# Patient Record
Sex: Female | Born: 1985 | Race: White | Hispanic: No | Marital: Married | Smoking: Never smoker
Health system: Southern US, Community
[De-identification: ages and names within clinical notes are randomized; demographics above are authoritative.]

## PROBLEM LIST (undated history)

## (undated) DIAGNOSIS — Z8619 Personal history of other infectious and parasitic diseases: Secondary | ICD-10-CM

## (undated) DIAGNOSIS — F988 Other specified behavioral and emotional disorders with onset usually occurring in childhood and adolescence: Secondary | ICD-10-CM

## (undated) DIAGNOSIS — I10 Essential (primary) hypertension: Secondary | ICD-10-CM

## (undated) HISTORY — DX: Other specified behavioral and emotional disorders with onset usually occurring in childhood and adolescence: F98.8

## (undated) HISTORY — DX: Personal history of other infectious and parasitic diseases: Z86.19

---
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
## 2005-08-01 ENCOUNTER — Other Ambulatory Visit: Admission: RE | Admit: 2005-08-01 | Discharge: 2005-08-01 | Payer: Self-pay | Admitting: Obstetrics and Gynecology

## 2005-09-05 ENCOUNTER — Other Ambulatory Visit: Admission: RE | Admit: 2005-09-05 | Discharge: 2005-09-05 | Payer: Self-pay | Admitting: Obstetrics and Gynecology

## 2008-11-06 ENCOUNTER — Other Ambulatory Visit: Admission: RE | Admit: 2008-11-06 | Discharge: 2008-11-06 | Payer: Self-pay | Admitting: Obstetrics and Gynecology

## 2008-12-03 ENCOUNTER — Other Ambulatory Visit: Admission: RE | Admit: 2008-12-03 | Discharge: 2008-12-03 | Payer: Self-pay | Admitting: Obstetrics and Gynecology

## 2012-01-02 ENCOUNTER — Other Ambulatory Visit: Payer: Self-pay | Admitting: Neurosurgery

## 2012-01-02 DIAGNOSIS — IMO0002 Reserved for concepts with insufficient information to code with codable children: Secondary | ICD-10-CM

## 2012-01-02 DIAGNOSIS — M545 Low back pain: Secondary | ICD-10-CM

## 2012-01-04 ENCOUNTER — Ambulatory Visit
Admission: RE | Admit: 2012-01-04 | Discharge: 2012-01-04 | Disposition: A | Discharge status: Home / Self Care | Payer: BC Managed Care – PPO | Source: Ambulatory Visit | Attending: Neurosurgery | Admitting: Neurosurgery

## 2012-01-04 DIAGNOSIS — IMO0002 Reserved for concepts with insufficient information to code with codable children: Secondary | ICD-10-CM

## 2012-01-04 DIAGNOSIS — M545 Low back pain: Secondary | ICD-10-CM

## 2012-07-18 ENCOUNTER — Other Ambulatory Visit: Payer: Self-pay | Admitting: Occupational Medicine

## 2012-07-18 ENCOUNTER — Ambulatory Visit
Admission: RE | Admit: 2012-07-18 | Discharge: 2012-07-18 | Disposition: A | Discharge status: Home / Self Care | Payer: No Typology Code available for payment source | Source: Ambulatory Visit | Attending: Occupational Medicine | Admitting: Occupational Medicine

## 2012-07-18 DIAGNOSIS — Z021 Encounter for pre-employment examination: Secondary | ICD-10-CM

## 2013-02-19 ENCOUNTER — Telehealth: Payer: Self-pay | Admitting: Nurse Practitioner

## 2013-02-19 MED ORDER — NORGESTIM-ETH ESTRAD TRIPHASIC 0.18/0.215/0.25 MG-35 MCG PO TABS: 1.0000 | ORAL_TABLET | Freq: Every day | ORAL | Status: DC

## 2013-02-19 NOTE — Telephone Encounter (Signed)
Reached pt's VM confirming name and LM that refills were called in to [facility] pharmacy.  aa

## 2013-02-19 NOTE — Telephone Encounter (Signed)
Pt would like a refill on trisprintec sent to [facility] pharmacy at [phone].

## 2013-02-19 NOTE — Telephone Encounter (Signed)
Ok to refill until AEX. 

## 2013-02-19 NOTE — Telephone Encounter (Signed)
[facility] pharmacy not available as a selection for e-prescribe. Call to [facility] for refill of Trisprintec 1 PO QD as directed. Disp 1 pack with 1 refill per PG.  aa

## 2013-02-19 NOTE — Telephone Encounter (Signed)
Pt has AEX appt 04-02-13. OK to refill trisprintec until then?  aa

## 2013-02-21 ENCOUNTER — Other Ambulatory Visit: Payer: Self-pay | Admitting: *Deleted

## 2013-02-21 MED ORDER — NORGESTIM-ETH ESTRAD TRIPHASIC 0.18/0.215/0.25 MG-35 MCG PO TABS: 1.0000 | ORAL_TABLET | Freq: Every day | ORAL | Status: DC

## 2013-04-01 ENCOUNTER — Encounter: Payer: Self-pay | Admitting: *Deleted

## 2013-04-02 ENCOUNTER — Encounter: Payer: Self-pay | Admitting: Nurse Practitioner

## 2013-04-02 ENCOUNTER — Ambulatory Visit: Payer: Self-pay | Admitting: Nurse Practitioner

## 2013-04-02 DIAGNOSIS — Z01419 Encounter for gynecological examination (general) (routine) without abnormal findings: Secondary | ICD-10-CM

## 2013-04-29 ENCOUNTER — Ambulatory Visit (INDEPENDENT_AMBULATORY_CARE_PROVIDER_SITE_OTHER): Payer: 59 | Admitting: Nurse Practitioner

## 2013-04-29 ENCOUNTER — Encounter: Payer: Self-pay | Admitting: Nurse Practitioner

## 2013-04-29 VITALS — BP 104/60 | HR 56 | Resp 12 | Ht 63.0 in | Wt 125.4 lb

## 2013-04-29 DIAGNOSIS — Z0000 Encounter for general adult medical examination without abnormal findings: Secondary | ICD-10-CM

## 2013-04-29 DIAGNOSIS — Z309 Encounter for contraceptive management, unspecified: Secondary | ICD-10-CM

## 2013-04-29 DIAGNOSIS — IMO0001 Reserved for inherently not codable concepts without codable children: Secondary | ICD-10-CM

## 2013-04-29 DIAGNOSIS — Z01419 Encounter for gynecological examination (general) (routine) without abnormal findings: Secondary | ICD-10-CM

## 2013-04-29 LAB — POCT URINALYSIS DIPSTICK
Leukocytes, UA: NEGATIVE
Spec Grav, UA: 1.02
Urobilinogen, UA: NEGATIVE

## 2013-04-29 MED ORDER — NORGESTIM-ETH ESTRAD TRIPHASIC 0.18/0.215/0.25 MG-35 MCG PO TABS: 1.0000 | ORAL_TABLET | Freq: Every day | ORAL | Status: DC

## 2013-04-29 NOTE — Patient Instructions (Signed)

## 2013-04-29 NOTE — Progress Notes (Signed)
27 y.o. G0P0 Married Caucasian Fe here for annual exam. No new health problems. Graduated from [employer] 04/12/13. Her menses last 4-5 days on OCP.  Not planning family this year but plans on genetic testing prior because of brothers history of Batten disease.  Patient's last menstrual period was 04/17/2013.          Sexually active: yes  The current method of family planning is OCP (estrogen/progesterone).    Exercising: yes  cardio and weight lifting.  Smoker:  no  Health Maintenance: Pap:  12/20/2011  HPV detected ( For repeat co testing this year) TDaP:  08/2012 Labs: pt refused   reports that she has never smoked. She has never used smokeless tobacco. She reports that  drinks alcohol. She reports that she does not use illicit drugs.  Past Medical History  Diagnosis Date  . STD (sexually transmitted disease)     HSV  . ADD (attention deficit disorder)     History reviewed. No pertinent past surgical history.  Current Outpatient Prescriptions  Medication Sig Dispense Refill  . Multiple Vitamins-Minerals (MULTIVITAMIN PO) Take by mouth daily.      . Norgestimate-Ethinyl Estradiol Triphasic 0.18/0.215/0.25 MG-35 MCG tablet Take 1 tablet by mouth daily.  1 Package  1   No current facility-administered medications for this visit.    History reviewed. No pertinent family history.  ROS:  Pertinent items are noted in HPI.  Otherwise, a comprehensive ROS was negative.  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 Heart: regular rate and rhythm Abdomen: soft, non-tender; no masses,  no organomegaly Extremities: extremities normal, atraumatic, no cyanosis or edema Skin: Skin color, texture, turgor normal. No rashes or lesions Lymph nodes: Cervical, supraclavicular, and axillary nodes normal. No abnormal inguinal nodes palpated Neurologic: Grossly normal   Pelvic: External genitalia:  no lesions              Urethra:  normal appearing urethra with no masses, tenderness or lesions              Bartholin's and Skene's: normal                 Vagina: normal appearing vagina with normal color and discharge, no lesions              Cervix: anteverted              Pap taken: yes           (Always hard to visualize cervix secondary to patient cooperation) Bimanual Exam:  Uterus:  normal size, contour, position, consistency, mobility, non-tender              Adnexa: no mass, fullness, tenderness               Rectovaginal: Confirms               Anus:  normal sphincter  tone, no lesions  A:  Well Woman with normal exam  Contraception   P:   Pap smear as per guidelines   Refill om Tri Sprintec for 1 year  Will use OTC antifungal med's prn.  counseled on adequate intake of calcium and vitamin D, diet and exercise return annually or prn  An After Visit Summary was printed and given to the patient.

## 2013-04-30 NOTE — Progress Notes (Signed)
Reviewed personally.  M. Suzanne Jamilyn Pigeon, MD.  

## 2013-05-01 LAB — IPS PAP TEST WITH HPV

## 2013-05-02 ENCOUNTER — Other Ambulatory Visit: Payer: Self-pay | Admitting: Nurse Practitioner

## 2013-05-03 ENCOUNTER — Telehealth: Payer: Self-pay | Admitting: *Deleted

## 2013-05-03 ENCOUNTER — Other Ambulatory Visit: Payer: Self-pay | Admitting: Obstetrics & Gynecology

## 2013-05-03 NOTE — Telephone Encounter (Signed)
Patient notified of Pap showing abnormal cells and recommend colpo.  Brief explanation of procedure.  She is on OCP.  She will come early to sign consent and get Ativan. Appt scheduled for 05-08-13. Instructed to take Motrin 800mg  1 hr prior with food.

## 2013-05-03 NOTE — Telephone Encounter (Signed)
Message copied by Alisa Graff on Fri May 03, 2013 10:17 AM ------      Message from: Ria Comment R      Created: Thu May 02, 2013 10:48 AM       patient needs colpo biopsy - order has been placed ------

## 2013-05-08 ENCOUNTER — Encounter: Payer: Self-pay | Admitting: Obstetrics & Gynecology

## 2013-05-08 ENCOUNTER — Ambulatory Visit (INDEPENDENT_AMBULATORY_CARE_PROVIDER_SITE_OTHER): Payer: 59 | Admitting: Obstetrics & Gynecology

## 2013-05-08 VITALS — BP 124/80 | HR 60 | Temp 97.9°F | Resp 14 | Ht 63.75 in | Wt 128.0 lb

## 2013-05-08 DIAGNOSIS — R6889 Other general symptoms and signs: Secondary | ICD-10-CM

## 2013-05-08 DIAGNOSIS — R87610 Atypical squamous cells of undetermined significance on cytologic smear of cervix (ASC-US): Secondary | ICD-10-CM

## 2013-05-08 DIAGNOSIS — R87810 Cervical high risk human papillomavirus (HPV) DNA test positive: Secondary | ICD-10-CM

## 2013-05-08 NOTE — Patient Instructions (Signed)

## 2013-05-08 NOTE — Progress Notes (Addendum)
Subjective:     Patient ID: Carly Suarez, female   DOB: 1985-12-06, 27 y.o.   MRN: [id number]  HPI 27 year old G0 with newly abnormal Pap smear.  ASCUS Pap with +HR HPV obtained at [facility]EX by Ria Comment 04/29/13.  Results and HPV findings d/w pt and spouse.  Procedure described.  All questions answered.  LMP 04/17/13.  On OCPs for contraception.   Review of Systems  All other systems reviewed and are negative.   Ativan 1mg  po x 1 given before procedure and before consent was obtained.  Lot # [id number], exp 12/14.    Objective:   Physical Exam  Genitourinary:         Blood pressure 124/80, pulse 60, temperature 97.9 F (36.6 C), resp. rate 14, height 5' 3.75" (1.619 m), weight 128 lb (58.06 kg), last menstrual period 04/17/2013.  Speculum inserted atraumatically and cervix visualized.  3% acetic acid applied.  Cervix examined using 3.75 and 7.5   X magnification and green filter.  HPV changes noted at 4-6 o'clock position on cervix.  Ectocervical trasntion zone noted.  Mosaicism with AWE at 11- 1 o'clock.  Biopsy obtained at 11:00 and ECC obtained.  Monsel's used for adquate hemostasis.  Colposcopy was adequate.  Patient tolerated procedure well.  All instruments removed.       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

## 2013-05-13 NOTE — Addendum Note (Signed)
Addended by: Jerene Bears on: 05/13/2013 08:34 AM   Modules accepted: Orders

## 2013-05-16 ENCOUNTER — Telehealth: Payer: Self-pay

## 2013-05-16 NOTE — Telephone Encounter (Signed)
6/25 lmtcb//kn 

## 2013-05-16 NOTE — Telephone Encounter (Signed)
Message copied by Elisha Headland on Thu May 16, 2013  9:33 AM ------      Message from: Jerene Bears      Created: Wed May 15, 2013  1:27 PM       Inform biopsy showed CIN 1, low grade changes.  Needs pap and HR HPV one year.  Would prefer her AEX be with me so I can do her Pap smear.  Recall one year. ------

## 2013-05-16 NOTE — Telephone Encounter (Signed)
Patient notified of all results. Appointment made for next year, recall in//kn

## 2013-11-18 ENCOUNTER — Telehealth: Payer: Self-pay | Admitting: Obstetrics & Gynecology

## 2013-11-18 NOTE — Telephone Encounter (Signed)
Message left to return call to Christna Kulick at [phone].    

## 2013-11-18 NOTE — Telephone Encounter (Signed)
Pt has some knots around her labia

## 2013-11-19 ENCOUNTER — Ambulatory Visit (INDEPENDENT_AMBULATORY_CARE_PROVIDER_SITE_OTHER): Payer: 59 | Admitting: Nurse Practitioner

## 2013-11-19 ENCOUNTER — Encounter: Payer: Self-pay | Admitting: Nurse Practitioner

## 2013-11-19 VITALS — BP 120/84 | HR 84 | Ht 63.0 in | Wt 134.0 lb

## 2013-11-19 DIAGNOSIS — N907 Vulvar cyst: Secondary | ICD-10-CM

## 2013-11-19 DIAGNOSIS — N9089 Other specified noninflammatory disorders of vulva and perineum: Secondary | ICD-10-CM

## 2013-11-19 MED ORDER — SULFAMETHOXAZOLE-TMP DS 800-160 MG PO TABS
1.0000 | ORAL_TABLET | Freq: Two times a day (BID) | ORAL | Status: DC
Start: 2013-11-19 — End: 2013-12-10

## 2013-11-19 NOTE — Telephone Encounter (Signed)
Return call to patient, reports "knots" on labia minora that are uncomfortable with wiping and intercourse. Requests to have area checked.  Appt today with Patty at 1:45pm.   Routing to provider for final review. Patient agreeable to disposition. Will close encounter

## 2013-11-19 NOTE — Progress Notes (Signed)
Subjective:     Patient ID: Carly Suarez, female   DOB: December 30, 1985, 27 y.o.   MRN: [id number]  HPI  This 27 yo Fe complains of pain of the right labia for over a week.  She has history of labia HSV I but his is not similar to past lesions.  Area is uncomfortable with SA and with wiping.  She is married and in a monogamous relationship.  Denies any urinary symptoms.   Review of Systems  Constitutional: Negative for fever and chills.  Gastrointestinal: Negative for nausea, vomiting and abdominal pain.  Genitourinary: Positive for vaginal pain and dyspareunia. Negative for dysuria, urgency, frequency, hematuria, flank pain, vaginal discharge and pelvic pain.       Objective:   Physical Exam  Constitutional: She appears well-developed and well-nourished. No distress.  Abdominal: Soft. She exhibits no distension. There is no tenderness. There is no rebound.  Genitourinary: Vagina normal and uterus normal.    No vaginal discharge found.  Two small 1-2 mm lesion without exudate or induration that is very tender to palpation right labia minora. Another lesion is noted on the left labia minora.   Patient also seen by Dr. Hyacinth Meeker for consultation.       Assessment:     Sebaceous cyst ?    Plan:     Will have her to start on antibiotics - Bactrim DS BID for 10 days.  Will also use Aveeno baths prn for discomfort Extra lubrication for SA Recheck here in 2 1/2 - 3 weeks on the same day that Dr. Hyacinth Meeker is here for a recheck.   If symptoms worsens in the interim to call back.

## 2013-11-19 NOTE — Telephone Encounter (Signed)
Returning a cal to [place].

## 2013-11-19 NOTE — Patient Instructions (Signed)

## 2013-11-19 NOTE — Progress Notes (Signed)
Reviewed personally.  M. Suzanne Josefina Rynders, MD.  

## 2013-12-10 ENCOUNTER — Encounter: Payer: Self-pay | Admitting: Nurse Practitioner

## 2013-12-10 ENCOUNTER — Ambulatory Visit (INDEPENDENT_AMBULATORY_CARE_PROVIDER_SITE_OTHER): Payer: 59 | Admitting: Nurse Practitioner

## 2013-12-10 VITALS — BP 120/84 | HR 64 | Ht 63.0 in | Wt 133.0 lb

## 2013-12-10 DIAGNOSIS — N907 Vulvar cyst: Secondary | ICD-10-CM

## 2013-12-10 DIAGNOSIS — N9089 Other specified noninflammatory disorders of vulva and perineum: Secondary | ICD-10-CM

## 2013-12-10 MED ORDER — SULFAMETHOXAZOLE-TMP DS 800-160 MG PO TABS: 1.0000 | ORAL_TABLET | Freq: Two times a day (BID) | ORAL | Status: DC

## 2013-12-10 NOTE — Progress Notes (Signed)
Subjective:     Patient ID: Carly Suarez, female   DOB: 09/20/1986, 28 y.o.   MRN: [id number]  HPI  This 28 yo WM Fe presents for a 2 week recheck on a vulvar lesions.  She has now completed antibiotic therapy with Bactrim.  After starting the antibiotic, area on the right got larger then started to improve, states it looked like there was a head but no exudate. The other 2 lesions are gone.   She is still tender at this site.  She can wear jeans and not as uncomfortable. Denies any urinary symptoms.  SA a few times since last here with slight discomfort even though used lubrication.  Review of Systems  Constitutional: Negative for fever, chills and fatigue.  HENT: Negative.   Cardiovascular: Negative.   Gastrointestinal: Negative.  Negative for nausea, vomiting, abdominal pain and diarrhea.  Genitourinary: Positive for vaginal pain and dyspareunia. Negative for dysuria, urgency, frequency, flank pain, vaginal bleeding, vaginal discharge, enuresis and pelvic pain.       Right vulva at same sight. Slight pain.  Musculoskeletal: Negative.   Skin: Negative.   Neurological: Negative.   Psychiatric/Behavioral: Negative.        Objective:   Physical Exam  Constitutional: She appears well-developed and well-nourished. No distress.  Patient was seen in consult with Dr. Hyacinth MeekerMiller as well.  Abdominal: Soft. She exhibits no distension. There is no tenderness.  Genitourinary:  Still remains a lesion on right labia that is maybe slight smaller that 2 weeks ago,.       Assessment:     Still feel this lesion could be a sebaceous cyst that is trying to resolve.    Plan:     Per Dr. Hyacinth MeekerMiller will do another course of antibiotics of Bactrim and recheck in 2 weeks.  If then still not better will get a biopsy of this area.  Prior to coming in for the biopsy she will need RX for Valium 5 mg X 1 and Lidocane jelly 5 % to apply topically.  Someone will need to drive her and she will need consent signed for  vulvar biopsy at next visit - not on day of visit secondary to med's.

## 2013-12-10 NOTE — Progress Notes (Signed)
Reviewed personally.  Pt see with Ria CommentPatricia Grubb as well.   Lum KeasM. Suzanne Elgie Maziarz, MD.

## 2013-12-18 ENCOUNTER — Encounter: Payer: Self-pay | Admitting: Obstetrics & Gynecology

## 2013-12-24 ENCOUNTER — Encounter: Payer: Self-pay | Admitting: Nurse Practitioner

## 2013-12-24 ENCOUNTER — Ambulatory Visit: Payer: 59 | Admitting: Nurse Practitioner

## 2013-12-24 ENCOUNTER — Ambulatory Visit (INDEPENDENT_AMBULATORY_CARE_PROVIDER_SITE_OTHER): Payer: 59 | Admitting: Nurse Practitioner

## 2013-12-24 VITALS — BP 100/64 | HR 76 | Ht 63.0 in | Wt 133.0 lb

## 2013-12-24 DIAGNOSIS — N907 Vulvar cyst: Secondary | ICD-10-CM

## 2013-12-24 DIAGNOSIS — N9089 Other specified noninflammatory disorders of vulva and perineum: Secondary | ICD-10-CM

## 2013-12-24 NOTE — Progress Notes (Signed)
Patient ID: Carly Suarez, female   DOB: 02/05/1986, 28 y.o.   MRN: [id number] S: HPI This 28 yo WM Fe presents for a 2 week recheck on a vulvar lesions. She has now completed antibiotic therapy with Bactrim X 2 courses. The other 2 lesions are gone. She is not tender at this site. She can wear jeans and not as uncomfortable.  Denies any urinary symptoms. SA a few times since last here with no discomfort and used lubrication. Other ROS is negative.  O: Pelvic: lesion on right labia is 95 % improved.  Only very minimal  firm less than 1 mm spot is left and is non tender. No other lesions are noted.  No vaginal discharge.  A: Sebaceous cyst almost resolved   P:  No other antibiotics needed at this time - she will continue to  Monitor and call back if needed.

## 2013-12-26 ENCOUNTER — Encounter: Payer: Self-pay | Admitting: Nurse Practitioner

## 2013-12-27 NOTE — Progress Notes (Signed)
Reviewed personally.  M. Suzanne Shaheed Schmuck, MD.  

## 2014-01-07 ENCOUNTER — Ambulatory Visit: Payer: Self-pay | Admitting: Family Medicine

## 2014-01-21 ENCOUNTER — Ambulatory Visit: Payer: Self-pay | Admitting: Family Medicine

## 2014-01-30 ENCOUNTER — Telehealth: Payer: Self-pay | Admitting: Obstetrics & Gynecology

## 2014-01-30 ENCOUNTER — Ambulatory Visit (INDEPENDENT_AMBULATORY_CARE_PROVIDER_SITE_OTHER): Payer: 59 | Admitting: Family Medicine

## 2014-01-30 ENCOUNTER — Ambulatory Visit (INDEPENDENT_AMBULATORY_CARE_PROVIDER_SITE_OTHER): Payer: 59 | Admitting: Nurse Practitioner

## 2014-01-30 ENCOUNTER — Encounter: Payer: Self-pay | Admitting: Family Medicine

## 2014-01-30 ENCOUNTER — Encounter: Payer: Self-pay | Admitting: Nurse Practitioner

## 2014-01-30 VITALS — BP 122/84 | HR 80 | Ht 63.0 in | Wt 132.0 lb

## 2014-01-30 VITALS — BP 102/64 | HR 62 | Temp 98.2°F | Ht 63.5 in | Wt 132.2 lb

## 2014-01-30 DIAGNOSIS — A6000 Herpesviral infection of urogenital system, unspecified: Secondary | ICD-10-CM

## 2014-01-30 DIAGNOSIS — Z13220 Encounter for screening for lipoid disorders: Secondary | ICD-10-CM

## 2014-01-30 LAB — COMPREHENSIVE METABOLIC PANEL
ALK PHOS: 44 U/L (ref 39–117)
ALT: 18 U/L (ref 0–35)
AST: 18 U/L (ref 0–37)
Albumin: 4.6 g/dL (ref 3.5–5.2)
BUN: 11 mg/dL (ref 6–23)
CO2: 31 meq/L (ref 19–32)
Calcium: 9.7 mg/dL (ref 8.4–10.5)
Chloride: 105 mEq/L (ref 96–112)
Creatinine, Ser: 0.8 mg/dL (ref 0.4–1.2)
GFR: 87.04 mL/min (ref >60.00)
GLUCOSE: 93 mg/dL (ref 70–99)
Potassium: 4.2 mEq/L (ref 3.5–5.1)
SODIUM: 141 meq/L (ref 135–145)
TOTAL PROTEIN: 7.6 g/dL (ref 6.0–8.3)
Total Bilirubin: 1 mg/dL (ref 0.3–1.2)

## 2014-01-30 LAB — LIPID PANEL
Cholesterol: 181 mg/dL (ref 0–200)
HDL: 72.6 mg/dL (ref >39.00)
LDL CALC: 81 mg/dL (ref 0–99)
Total CHOL/HDL Ratio: 2
Triglycerides: 139 mg/dL (ref 0.0–149.0)
VLDL: 27.8 mg/dL (ref 0.0–40.0)

## 2014-01-30 MED ORDER — VALACYCLOVIR HCL 1 G PO TABS: 1000.0000 mg | ORAL_TABLET | Freq: Two times a day (BID) | ORAL | Status: DC

## 2014-01-30 NOTE — Progress Notes (Signed)
Pre visit review using our clinic review tool, if applicable. No additional management support is needed unless otherwise documented below in the visit note. 

## 2014-01-30 NOTE — Patient Instructions (Addendum)
Stop at lab on way out. We will call with lab results.

## 2014-01-30 NOTE — Telephone Encounter (Signed)
Spoke with patient. Advised of instructions per Lauro FranklinPatricia Rolen-Grubb, FNP as written on AVS :Take 1 tablet twice daily for 3-4 days then 1/2 tablet daily for remainder of time of outbreak - usually 5 days.  Advised she may have some medication left over. Advised to call our office back if has another outbreak or this outbreak is not improved with regimen. Patient agreeable and verbalized understanding of instructions.   Routing to provider for final review. Patient agreeable to disposition. Will close encounter

## 2014-01-30 NOTE — Telephone Encounter (Signed)
Patient was seen today with Dr. Hyacinth MeekerMiller. She wants to clarify instructions for taking Valtrex.

## 2014-01-30 NOTE — Progress Notes (Signed)
   Subjective:    Patient ID: Carly Suarez, female    DOB: 07/06/1986, 28 y.o.   MRN: [id number]  HPI  28 year old female presents to establish.  [facility] Doristine LocksGriffen. Last OV several months ago for pit rosea.  Sees GYN  Dr. Hyacinth MeekerMiller. Last CPX 04/2013, abnml pap, colposcopy was low risk.  She reports she is doing very well.  No specific concerns. Just here to establish.     Review of Systems  Constitutional: Negative for fever, fatigue and unexpected weight change.  HENT: Negative for congestion, ear pain, sinus pressure, sneezing, sore throat and trouble swallowing.   Eyes: Negative for pain and itching.  Respiratory: Negative for cough, shortness of breath and wheezing.   Cardiovascular: Negative for chest pain, palpitations and leg swelling.  Gastrointestinal: Negative for nausea, abdominal pain, diarrhea, constipation and blood in stool.  Genitourinary: Negative for dysuria, hematuria, vaginal discharge, difficulty urinating and menstrual problem.  Skin: Negative for rash.  Neurological: Negative for syncope, weakness, light-headedness, numbness and headaches.  Psychiatric/Behavioral: Negative for confusion and dysphoric mood. The patient is not nervous/anxious.        Objective:   Physical Exam  Constitutional: Vital signs are normal. She appears well-developed and well-nourished. She is cooperative.  Non-toxic appearance. She does not appear ill. No distress.  HENT:  Head: Normocephalic.  Right Ear: Hearing, tympanic membrane, external ear and ear canal normal.  Left Ear: Hearing, tympanic membrane, external ear and ear canal normal.  Nose: Nose normal.  Eyes: Conjunctivae, EOM and lids are normal. Pupils are equal, round, and reactive to light. Lids are everted and swept, no foreign bodies found.  Neck: Trachea normal and normal range of motion. Neck supple. Carotid bruit is not present. No mass and no thyromegaly present.  Cardiovascular: Normal  rate, regular rhythm, S1 normal, S2 normal, normal heart sounds and intact distal pulses.  Exam reveals no gallop.   No murmur heard. Pulmonary/Chest: Effort normal and breath sounds normal. No respiratory distress. She has no wheezes. She has no rhonchi. She has no rales.  Abdominal: Soft. Normal appearance and bowel sounds are normal. She exhibits no distension, no fluid wave, no abdominal bruit and no mass. There is no hepatosplenomegaly. There is no tenderness. There is no rebound, no guarding and no CVA tenderness. No hernia.  Lymphadenopathy:    She has no cervical adenopathy.    She has no axillary adenopathy.  Neurological: She is alert. She has normal strength. No cranial nerve deficit or sensory deficit.  Skin: Skin is warm, dry and intact. No rash noted.  Psychiatric: Her speech is normal and behavior is normal. Judgment normal. Her mood appears not anxious. Cognition and memory are normal. She does not exhibit a depressed mood.          Assessment & Plan:

## 2014-01-30 NOTE — Progress Notes (Signed)
Subjective:     Patient ID: Carly Suarez, female   DOB: 11/24/1985, 28 y.o.   MRN: [id number]  HPI  This 28 yo G0 MW Fe has a history of HSV diagnosed in 2012 culture proven.  She has not had another outbreak in these past years.  She now presents with lesions that she feels is an outbreak since last pm.  The areas are tender and cause some discomfort with wiping. No urinary symptoms. She did not have any Valtrex to start therapy.  She and her husband are having some marital problems.  She is interested in someone else but not SA.  She has been very nervous and anxious.  Review of Systems  Constitutional: Negative for fever and chills.  Gastrointestinal: Negative.   Genitourinary: Positive for genital sores. Negative for dysuria, urgency, frequency, flank pain and pelvic pain.  Neurological: Negative.   Psychiatric/Behavioral: The patient is nervous/anxious.        Objective:   Physical Exam  Constitutional: She is oriented to person, place, and time. She appears well-developed and well-nourished. No distress.  Abdominal: Soft. She exhibits no distension. There is no tenderness. There is no rebound.  Genitourinary:     Multiple small vesicular HSV lesions at the upper labial folds consistent with HSV outbreak.  No re ulture was done. No vaginal discharge.  Neurological: She is alert and oriented to person, place, and time.  Psychiatric: She has a normal mood and affect. Her behavior is normal. Judgment and thought content normal.  Tearful at times.       Assessment:     Flare if HSV    Plan:     Refill of Valtrex 1 Gm - initially for a few days at BID then reduce to 500 mg BID until resolution. She will continue to work in her stress levels and resolve issues with her husband.

## 2014-01-30 NOTE — Patient Instructions (Signed)
Take 1 tablet twice daily for 3-4 days then 1/2 tablet daily for remainder of time of outbreak - usually 5 days.

## 2014-02-04 NOTE — Progress Notes (Signed)
Encounter reviewed by Dr. Brook Silva.  

## 2014-02-06 ENCOUNTER — Telehealth: Payer: Self-pay | Admitting: Nurse Practitioner

## 2014-02-06 MED ORDER — SULFAMETHOXAZOLE-TMP DS 800-160 MG PO TABS: 1.0000 | ORAL_TABLET | Freq: Two times a day (BID) | ORAL | Status: DC

## 2014-02-06 NOTE — Telephone Encounter (Signed)
Spoke with patient. She states that cyst has returned and is "harder, redder, and angrier". No fevers, no vaginal discharge. Currently on Valtrex for treatment of HSV. Discussed with Dr. Hyacinth MeekerMiller and ordered Bactrim DS bid and sent to pharmacy of choice. Patient has appointment with Lauro FranklinPatricia Rolen-Grubb, FNP scheduled for 3/23. Will start on Bactrim and follow up on Monday.  Routing to provider for final review. Patient agreeable to disposition. Will close encounter

## 2014-02-06 NOTE — Telephone Encounter (Signed)
Came to see Patty on 01/30/14 for vaginal cyst and was put on antibiotics.  Patient states that the cyst is back. Gave her an appointment to see Patty on Monday 3/23 at 10:15 but wants to speak to a nurse in the meantime.

## 2014-02-10 ENCOUNTER — Encounter: Payer: Self-pay | Admitting: Nurse Practitioner

## 2014-02-10 ENCOUNTER — Ambulatory Visit (INDEPENDENT_AMBULATORY_CARE_PROVIDER_SITE_OTHER): Payer: 59 | Admitting: Nurse Practitioner

## 2014-02-10 VITALS — BP 116/72 | HR 80 | Ht 63.0 in | Wt 133.0 lb

## 2014-02-10 DIAGNOSIS — N9089 Other specified noninflammatory disorders of vulva and perineum: Secondary | ICD-10-CM

## 2014-02-10 DIAGNOSIS — N907 Vulvar cyst: Secondary | ICD-10-CM

## 2014-02-10 NOTE — Patient Instructions (Signed)

## 2014-02-10 NOTE — Progress Notes (Signed)
Subjective:     Patient ID: Carly Suarez, female   DOB: 07/03/1986, 28 y.o.   MRN: [id number]  HPI  28 yo WM Fe on a vulvar lesions. She is now on antibiotic therapy with Bactrim X 1 course since last Thursday.  She is tender at this same site earlier treated in January.  She is frustrated that this same lesion keeps returning even though it does respond to antibiotics.  She is thinking she wants it removed to keep from having a flare every few months. She can wear jeans and not as uncomfortable as in the past.  Denies any urinary symptoms.  Denies any urinary symptoms. SA a few times since last here with no discomfort and used lubrication. She did have a flare of HSV in March of the upper labia - but none at this site.  She was treated with Valtrex and symptoms cleared. She and her husband are working on their marriage and seeing a sex therapist.   Review of Systems  Constitutional: Negative for fever, chills and fatigue.  Gastrointestinal: Negative.   Genitourinary: Negative.  Negative for dysuria, urgency, frequency, flank pain and dyspareunia.  Musculoskeletal: Negative.   Skin: Negative.   Neurological: Negative.   Psychiatric/Behavioral: Negative.        Objective:   Physical Exam  Constitutional: She appears well-developed and well-nourished. No distress.  Abdominal: Soft. She exhibits no distension. There is no tenderness. There is no rebound.  Genitourinary:     Inside right labia minor along the edge is again the area of tenderness and firm sebaceous area without exudate.       Assessment:     Recurrent sebaceous cyst right labia minora    Plan:     She will finish with current Septra and then return in 2 weeks to see Dr. Hyacinth MeekerMiller.  She may decide to go ahead with I & D of cyst.

## 2014-02-10 NOTE — Progress Notes (Addendum)
Order placed for excision of vulvar cyst.  Will see patient in follow up and plan to remove lesion.  Reviewed personally.  Lum KeasM. Suzanne Wadie Mattie, MD.

## 2014-03-04 ENCOUNTER — Ambulatory Visit (INDEPENDENT_AMBULATORY_CARE_PROVIDER_SITE_OTHER): Payer: 59 | Admitting: Obstetrics & Gynecology

## 2014-03-04 VITALS — BP 120/78 | HR 58 | Resp 16 | Ht 63.0 in | Wt 134.0 lb

## 2014-03-04 DIAGNOSIS — N907 Vulvar cyst: Secondary | ICD-10-CM

## 2014-03-04 DIAGNOSIS — N9089 Other specified noninflammatory disorders of vulva and perineum: Secondary | ICD-10-CM

## 2014-03-20 NOTE — Progress Notes (Signed)
Subjective:     Patient ID: Carly Suarez, female   DOB: 08/10/1986, 28 y.o.   MRN: [id number]  HPI [age] yo G0 MWF here for possible excision of right labial cyst.  Pt reports cyst is now completely gone.  Denies pain.  Worried that is will return.  I really felt it was a sebaceous cyst and that, with time, it would go away.  She is still worried.  Advised, if occurs again and in same location, will just plan to excision.  Advised of risks of scarring and permanent pain.  She states she is very willing to take those risks.    Review of Systems  All other systems reviewed and are negative.      Objective:   Physical Exam  Constitutional: She is oriented to person, place, and time. She appears well-developed and well-nourished.  Genitourinary: Vagina normal.    There is no rash or tenderness on the right labia. There is no rash or tenderness on the left labia.  Neurological: She is alert and oriented to person, place, and time.  Skin: Skin is warm and dry.  Psychiatric: She has a normal mood and affect.       Assessment:     Probable prior right labial sebaceous cyst, resolved     Plan:     Pt to call back with any new issues.

## 2014-04-18 ENCOUNTER — Other Ambulatory Visit: Payer: Self-pay | Admitting: *Deleted

## 2014-04-18 DIAGNOSIS — IMO0001 Reserved for inherently not codable concepts without codable children: Secondary | ICD-10-CM

## 2014-04-18 MED ORDER — NORGESTIM-ETH ESTRAD TRIPHASIC 0.18/0.215/0.25 MG-35 MCG PO TABS: 1.0000 | ORAL_TABLET | Freq: Every day | ORAL | Status: DC

## 2014-04-18 NOTE — Telephone Encounter (Signed)
Faxed refill request received from [facility] for ORHTO TRICYCLEN Last filled by MD on 04/29/13, #3 X 3 Last AEX - 04/29/13 Next AEX - 05/20/14 1 PACK sent until AEX.

## 2014-04-23 ENCOUNTER — Telehealth (INDEPENDENT_AMBULATORY_CARE_PROVIDER_SITE_OTHER): Payer: 59 | Admitting: Nurse Practitioner

## 2014-04-23 DIAGNOSIS — N9089 Other specified noninflammatory disorders of vulva and perineum: Secondary | ICD-10-CM

## 2014-04-23 DIAGNOSIS — N907 Vulvar cyst: Secondary | ICD-10-CM

## 2014-04-23 NOTE — Telephone Encounter (Signed)
Patient has a cyst on her labia said that is has this twice before and seen patty first then was sent to dr Hyacinth Meeker. Dr Hyacinth Meeker told her last time that if it happened again to just get an appointment with her first.

## 2014-04-23 NOTE — Addendum Note (Signed)
Addended by: Joeseph Amor on: 04/23/2014 01:05 PM   Modules accepted: Orders

## 2014-04-23 NOTE — Telephone Encounter (Signed)
Spoke with patient. Patient states that she would like to come in to see Dr.Miller for cyst on labia that she has been seen in office for by Dr.Miller previously. Patient states she was told to come back to see Dr.Miller if it appeared again and have possible excision. Requesting appointment tomorrow as she lives in [place] and will be in town for another appointment already. Appointment scheduled for tomorrow at 10:30am with Dr.Miller. Patient agreeable to date and time.  Routing to provider for final review. Patient agreeable to disposition. Will close encounter

## 2014-04-23 NOTE — Telephone Encounter (Signed)
Order sent for precert for possible I&D of vulvar cyst.

## 2014-04-24 ENCOUNTER — Ambulatory Visit (INDEPENDENT_AMBULATORY_CARE_PROVIDER_SITE_OTHER): Payer: 59 | Admitting: Obstetrics & Gynecology

## 2014-04-24 ENCOUNTER — Encounter: Payer: Self-pay | Admitting: Obstetrics & Gynecology

## 2014-04-24 VITALS — BP 110/80 | HR 68 | Resp 16 | Ht 63.0 in | Wt 131.0 lb

## 2014-04-24 DIAGNOSIS — N9089 Other specified noninflammatory disorders of vulva and perineum: Secondary | ICD-10-CM

## 2014-04-24 DIAGNOSIS — N764 Abscess of vulva: Secondary | ICD-10-CM

## 2014-04-24 MED ORDER — SULFAMETHOXAZOLE-TRIMETHOPRIM 800-160 MG PO TABS: ORAL_TABLET | ORAL | Status: DC

## 2014-04-24 NOTE — Progress Notes (Signed)
Subjective:     Patient ID: Carly Suarez, female   DOB: November 28, 1985, 28 y.o.   MRN: [id number]  HPI 28 yo G0 MWF here for complaint of painful vulva cyst that has occurred over the past two to three days.  She had a similar episode with a lesion on the opposite side of her labia around Christmas.  Ulttimately, that lesion resolved on it's own.  Per pt hx, the area started to becomes tender about three days ago.  Then two days ago the area become quite "large" and is now a little better.  There is no drainage.  It is very tender.  She is here for possible I&D vs excision.  Denies fever.  No change in urinary or bowel habits.  Exercises regularly.  She has tried nothing to make the area better.  Cannot be sexually active when then occurs as it is too tender.  Review of Systems  All other systems reviewed and are negative.      Objective:   Physical Exam  Constitutional: She is oriented to person, place, and time. She appears well-developed and well-nourished.  Genitourinary: Vagina normal.    No labial fusion. There is no rash, tenderness, lesion or injury on the right labia. There is tenderness and lesion on the left labia. There is no rash or injury on the left labia.  Lesion in picture is 1-33mm with minimal skin erythema but is exquisitely tender for the pt.  There really isn't much to I&D.  With palpation, drainage from lesion is noted.  Cultures obtained.  Lymphadenopathy:       Right: No inguinal adenopathy present.       Left: No inguinal adenopathy present.  Neurological: She is alert and oriented to person, place, and time.  Skin: Skin is warm and dry.  Psychiatric:  Anxious   Pt wants area GONE, excised, whatever needs to be done to get rid of it.  Advised, this is quite atypical and proper diagnosis is really important. For example, excising an HSV lesion would not help and would only scar.  She does understand, she is just very frustrated.    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    Plan:     Vulvar culture HSV culture Bactrim DS x 7 days     About 40 minutes total spent with pt, >50% of this was in counseling as she is prepared for a procedure today.  Again, do not feel this is appropriate and that correct diagnosis is important.  Ultimately, she understands plan.  Will be called with results as they are finalized.

## 2014-04-27 LAB — WOUND CULTURE
GRAM STAIN: NONE SEEN
GRAM STAIN: NONE SEEN
Gram Stain: NONE SEEN

## 2014-04-28 LAB — HERPES SIMPLEX VIRUS CULTURE: Organism ID, Bacteria: NOT DETECTED

## 2014-04-28 MED ORDER — CIPROFLOXACIN HCL 500 MG PO TABS: 500.0000 mg | ORAL_TABLET | Freq: Two times a day (BID) | ORAL | Status: DC

## 2014-04-29 ENCOUNTER — Telehealth: Payer: Self-pay | Admitting: Emergency Medicine

## 2014-04-29 NOTE — Telephone Encounter (Signed)
Message left to return call to Lekeya Rollings at [phone].    

## 2014-04-29 NOTE — Telephone Encounter (Signed)
Message copied by Joeseph Amor on Tue Apr 29, 2014  9:46 AM ------      Message from: Jerene Bears      Created: Mon Apr 28, 2014  5:25 AM       Please call pt.  Skin culture was + for e. Coli.  This is a little unusual as this bacterial usually causes urinary tract infections.  Also, it is resistant to the Bactrim antibiotic I gave her.  She needs to switch to Keflex ciprofloxin 500mg  bid x 7 days.  Rx sent to the pharmacy. ------

## 2014-04-29 NOTE — Telephone Encounter (Signed)
Spoke with patient. Message from Dr. Hyacinth Meeker given. She is advised to stop Bactrim and start on cipro 500 mg bid x 7 days and call back if not improving. She is agreeable.

## 2014-04-30 ENCOUNTER — Ambulatory Visit: Payer: 59 | Admitting: Nurse Practitioner

## 2014-05-05 ENCOUNTER — Telehealth: Payer: Self-pay | Admitting: Emergency Medicine

## 2014-05-05 NOTE — Telephone Encounter (Signed)
Message left to return call to Caitlen Worth at [phone].    

## 2014-05-05 NOTE — Telephone Encounter (Signed)
Message copied by Joeseph AmorFAST, Caliyah Sieh L on Mon May 05, 2014  3:36 PM ------      Message from: Jerene BearsMILLER, MARY S      Created: Tue Apr 29, 2014  6:13 PM       Please inform pt HSV testing was negative.  I'd like to recheck with her after she finished the Cipro antibiotics-- 10-14 days from now. ------

## 2014-05-05 NOTE — Telephone Encounter (Signed)
Patient notified. States she is feeling improved but has not checked area of lesion to ensure it is completely resolved. She is agreeable to office visit with Dr. Hyacinth MeekerMiller for 05/12/14 at 1030 and is scheduled.  Routing to provider for final review. Patient agreeable to disposition. Will close encounter

## 2014-05-05 NOTE — Telephone Encounter (Signed)
Message copied by Joeseph AmorFAST, Loralee Weitzman L on Mon May 05, 2014  3:04 PM ------      Message from: Jerene BearsMILLER, MARY S      Created: Tue Apr 29, 2014  6:13 PM       Please inform pt HSV testing was negative.  I'd like to recheck with her after she finished the Cipro antibiotics-- 10-14 days from now. ------

## 2014-05-09 ENCOUNTER — Ambulatory Visit: Payer: 59 | Admitting: Obstetrics & Gynecology

## 2014-05-12 ENCOUNTER — Ambulatory Visit (INDEPENDENT_AMBULATORY_CARE_PROVIDER_SITE_OTHER): Payer: 59 | Admitting: Obstetrics & Gynecology

## 2014-05-12 VITALS — BP 130/84 | HR 68 | Resp 16 | Ht 63.0 in | Wt 131.2 lb

## 2014-05-12 DIAGNOSIS — Z124 Encounter for screening for malignant neoplasm of cervix: Secondary | ICD-10-CM

## 2014-05-12 DIAGNOSIS — Z01419 Encounter for gynecological examination (general) (routine) without abnormal findings: Secondary | ICD-10-CM

## 2014-05-12 DIAGNOSIS — B009 Herpesviral infection, unspecified: Secondary | ICD-10-CM

## 2014-05-12 MED ORDER — NORGESTIM-ETH ESTRAD TRIPHASIC 0.18/0.215/0.25 MG-35 MCG PO TABS: 1.0000 | ORAL_TABLET | Freq: Every day | ORAL | Status: DC

## 2014-05-12 NOTE — Progress Notes (Deleted)
Subjective:     Patient ID: Carly Suarez, female   DOB: 12/20/1985, 28 y.o.   MRN: [id number]  HPI   Review of Systems     Objective:   Physical Exam     Assessment:     ***    Plan:     Contemplating suppresive therpay with Vlatrex.  If desires, will call and Rx for Valtrex 500mg  daily can be called in for her.  Pt will call if has any new vulvar symptoms.  D/w pt hibiclens and stopping shaving.  She will consider these.

## 2014-05-14 ENCOUNTER — Encounter: Payer: Self-pay | Admitting: Obstetrics & Gynecology

## 2014-05-14 DIAGNOSIS — B009 Herpesviral infection, unspecified: Secondary | ICD-10-CM | POA: Insufficient documentation

## 2014-05-14 NOTE — Progress Notes (Signed)
Patient ID: Carly Suarez, female   DOB: 11/25/1985, 28 y.o.   MRN: [id number]  28 y.o. G0P0 MarriedCaucasianF here for annual exam and for recheck of vulvar abscess.  Reviewed results with her.  Culture was + for e coli which was resistant to Bactrim.  When antibiotic switched, lesion improved very quickly per pt report.  HSV culture was negative, although pt has had outbreak in the past.  We discussed pros/cons of suppressive therapy.  Spouse has never had an outbreak.  Patient's last menstrual period was 04/17/2014.          Sexually active: yes  The current method of family planning is OCP (estrogen/progesterone).    Exercising: yes  Smoker:  no  Health Maintenance: Pap:  6/14 ASCUS pap with +HR HPV, colpo 05/08/13 History of abnormal Pap:  yes MMG: not needed Colonoscopy:  n/a BMD:   n/a TDaP:  Up to date per pt Screening Labs: none today, Hb today: not today, Urine today: not today   reports that she has never smoked. She has never used smokeless tobacco. She reports that she drinks alcohol. She reports that she does not use illicit drugs.  Past Medical History  Diagnosis Date  . ADD (attention deficit disorder)   . History of chicken pox   . History of shingles     Past Surgical History  Procedure Laterality Date  . No past surgeries      Current Outpatient Prescriptions  Medication Sig Dispense Refill  . Multiple Vitamins-Minerals (MULTIVITAMIN PO) Take by mouth daily.      . Norgestimate-Ethinyl Estradiol Triphasic 0.18/0.215/0.25 MG-35 MCG tablet Take 1 tablet by mouth daily.  3 Package  4  . ciprofloxacin (CIPRO) 500 MG tablet Take 1 tablet (500 mg total) by mouth 2 (two) times daily.  14 tablet  0  . valACYclovir (VALTREX) 1000 MG tablet Take 1 tablet (1,000 mg total) by mouth 2 (two) times daily. Take for 10 days  20 tablet  0   No current facility-administered medications for this visit.    Family History  Problem Relation Age of Onset  . Hypertension Mother    . Hypertension Father   . Prostate cancer Father 161  . Melanoma Father [phone]  . Diabetes Maternal Grandmother   . Arthritis Maternal Grandmother   . Hypertension Maternal Grandmother   . Prostate cancer Maternal Grandfather   . Heart disease Paternal Grandmother   . Other Brother [phone]    Batten Disease - gentic disorder  - attacks brain and nervous system     ROS:  Pertinent items are noted in HPI.  Otherwise, a comprehensive ROS was negative.  Exam:   BP 130/84  Pulse 68  Resp 16  Ht 5\' 3"  (1.6 m)  Wt 131 lb 3.2 oz (59.512 kg)  BMI 23.25 kg/m2  LMP 04/17/2014  Weight change: @WEIGHTCHANGE @ Height:   Height: 5\' 3"  (160 cm)  Ht Readings from Last 3 Encounters:  05/12/14 5\' 3"  (1.6 m)  04/24/14 5\' 3"  (1.6 m)  03/04/14 5\' 3"  (1.6 m)    General appearance: alert, cooperative and appears stated age Head: Normocephalic, without obvious abnormality, atraumatic Neck: no adenopathy, supple, symmetrical, trachea midline and thyroid normal to inspection and palpation Lungs: clear to auscultation bilaterally Breasts: normal appearance, no masses or tenderness Heart: regular rate and rhythm Abdomen: soft, non-tender; bowel sounds normal; no masses,  no organomegaly Extremities: extremities normal, atraumatic, no cyanosis or edema Skin: Skin color, texture, turgor normal. No rashes  or lesions Lymph nodes: Cervical, supraclavicular, and axillary nodes normal. No abnormal inguinal nodes palpated Neurologic: Grossly normal   Pelvic: External genitalia:  no lesions              Urethra:  normal appearing urethra with no masses, tenderness or lesions              Bartholins and Skenes: normal                 Vagina: normal appearing vagina with normal color and discharge, no lesions              Cervix: no lesions              Pap taken: yes Bimanual Exam:  Uterus:  normal size, contour, position, consistency, mobility, non-tender              Adnexa: normal adnexa and no mass, fullness,  tenderness               Rectovaginal: Confirms               Anus:  normal sphincter tone, no lesions  A:  Well Woman with normal exam Recurrent labia minora abscesses, culture + for E. 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  P:   Mammogram recommended starting age 28 pap smear with HR HPV obtained today Pt knows to call if has any future vulvar issues Contemplating suppressive HSV therapy.  Will call if desires to start.  Would start with Valtrex 500mg  daily for 3-6 months, minimum to start RF for OCPs given.  Pt continues to desire for contraception.  Aware of risks of DVT/PE, elevated BP, nausea, stroke. return annually or prn  An After Visit Summary was printed and given to the patient.

## 2014-05-19 LAB — IPS PAP TEST WITH HPV

## 2014-05-20 ENCOUNTER — Telehealth: Payer: Self-pay | Admitting: *Deleted

## 2014-05-20 ENCOUNTER — Ambulatory Visit: Payer: 59 | Admitting: Obstetrics & Gynecology

## 2014-05-20 DIAGNOSIS — N871 Moderate cervical dysplasia: Secondary | ICD-10-CM

## 2014-05-20 NOTE — Telephone Encounter (Signed)
Patient notified that pap smear with abnormal results (severity not indicated) and colposcopy recommended. Patient has had colpo in past and states she vaguely remembers this since she was given an medication for relaxation. Aware she will need driver and to come early to sign consent.  On OCP, on menses now.  Colpo scheduled for 05-26-14 at 4pm.  Instructed to take Motrin 800 mg one hour prior with food. Denies questions.  Ok for Ativan after consent signed?  Martie LeeSabrina, please precert for colpo.

## 2014-05-20 NOTE — Telephone Encounter (Signed)
Message copied by Alisa GraffYEAKLEY, SARAH on Tue May 20, 2014  9:47 AM ------      Message from: Jerene BearsMILLER, MARY S      Created: Mon May 19, 2014  5:37 PM       Inform Pap showed HGSIL cells.  Needs colposcopy.  She will most likely be upset about this--just FYI.  Out of current recall. ------

## 2014-05-21 NOTE — Telephone Encounter (Signed)
Ativan fine.  Needs to come an hour early.

## 2014-05-21 NOTE — Telephone Encounter (Signed)
Patient notified Dr Hyacinth MeekerMiller approved medication for relaxation for procedure. Instructed to come one hour early to sign consent and bring driver. Agreeable.  Encounter closed.

## 2014-05-26 ENCOUNTER — Encounter: Payer: Self-pay | Admitting: Obstetrics & Gynecology

## 2014-05-26 ENCOUNTER — Ambulatory Visit (INDEPENDENT_AMBULATORY_CARE_PROVIDER_SITE_OTHER): Payer: 59 | Admitting: Obstetrics & Gynecology

## 2014-05-26 VITALS — BP 112/80 | HR 72 | Resp 20 | Ht 63.0 in | Wt 130.0 lb

## 2014-05-26 DIAGNOSIS — N871 Moderate cervical dysplasia: Secondary | ICD-10-CM

## 2014-05-26 DIAGNOSIS — R87613 High grade squamous intraepithelial lesion on cytologic smear of cervix (HGSIL): Secondary | ICD-10-CM

## 2014-05-26 NOTE — Progress Notes (Signed)
Subjective:     Patient ID: Carly Suarez, female   DOB: 11/13/1986, 28 y.o.   MRN: [id number]  HPI 28 yo G0 MWF here for colposcopy due to HGSIL pap obtained at her [facility] 04/24/14.  H/O ASCUS pap with + HR HPV 1 year ago.  colpoposcpy then showed CIN 1.  No new sex partner changes.  Review of Systems     Objective:   Physical Exam  Constitutional: She is oriented to person, place, and time. She appears well-developed and well-nourished.  Genitourinary:    Neurological: She is alert and oriented to person, place, and time.  Psychiatric:  Anxious!    Speculum placed.  3% acetic acid applied to cervix for >45 seconds.  Cervix visualized with both 7.5X and 15X magnification.  Green filter also used.  Lugols solution was not used.  Findings:  Mosaicism at both 6 and 12 o'clock positions.  12 o'clock area had a raised, thick, white lesion within the mosaicism.  The 6 o'clock position had an erythematous but raised lesion within it.  Biopsy:  6 and 12 o'clock.  ECC:  was performed.  Monsel's was needed.  Excellent hemostasis was present.  Pt tolerated procedure well and all instruments were removed.  Findings noted above on picture of cervix.     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  Feel pt will probably need a LEEP for treatment.

## 2014-05-29 ENCOUNTER — Telehealth: Payer: Self-pay | Admitting: Obstetrics & Gynecology

## 2014-05-29 LAB — IPS OTHER TISSUE BIOPSY

## 2014-05-29 NOTE — Telephone Encounter (Deleted)
Message copied by Joeseph AmorFAST, Shirel Mallis L on Thu May 29, 2014  4:53 PM ------      Message from: Jerene BearsMILLER, MARY S      Created: Thu May 29, 2014  4:22 PM       Inform biopsies showed HGSIL in both biopsy sites.  ECC negative.  LEEP recommended.  Would also highly recommend doing this in the OR. ------

## 2014-05-29 NOTE — Telephone Encounter (Signed)
Pt having discharge after her colpo porcedure. Please call.

## 2014-05-29 NOTE — Telephone Encounter (Signed)
Patient given message below from Dr. Hyacinth MeekerMiller.  Routing to [place] for scheduling.

## 2014-05-29 NOTE — Telephone Encounter (Addendum)
Spoke with patient. Today she noticed a feeling of fullness in vaginal area and went to the bathroom and noticed what patient describes as "mucous coffee grounds" discharge. Patient states she did research on this and found that it will be normal as part of the medication used during the procedure and she just wanted to confirm this to be correct. I advised yes, that this discharge is most likely due to solution used during procedure.  Patient denies vaginal bleeding, abdominal pain, fevers, chills. She is agreeable to call back with any change in symptoms.   Patient requested results at this time as well. Given results below from Dr. Hyacinth MeekerMiller. Patient verbalizes understanding and strongly agrees to move forward with procedure in OR. She would like to schedule as soon as possible.  Advised that I would send her information to our surgical scheduling nurse and she would be contacted. Patient is agreeable to this. Will call back with any concerns.  Routing to provider for final review. Patient agreeable to disposition. Will close encounter

## 2014-05-29 NOTE — Telephone Encounter (Signed)
Message copied by Joeseph AmorFAST, Cyrah Mclamb L on Thu May 29, 2014  4:52 PM ------      Message from: Jerene BearsMILLER, MARY S      Created: Thu May 29, 2014  4:22 PM       Inform biopsies showed HGSIL in both biopsy sites.  ECC negative.  LEEP recommended.  Would also highly recommend doing this in the OR. ------

## 2014-06-02 NOTE — Telephone Encounter (Signed)
Carly Suarez,  Please precert for LEEP at hospital. Out-patient procedure with no assistant. I will schedule after you talk to her.

## 2014-06-02 NOTE — Telephone Encounter (Signed)
Professional charges are covered at 100% of allowable.  Spoke with and advised patient of this. Patient agreeable.

## 2014-06-05 NOTE — Telephone Encounter (Signed)
Spoke with patient regarding her date preferences. Menses due 07-10-14.  Only knows husband's schedule thru 7-31 and reguests 7-27 or 7-29.  Will check on 06-16-14 and call her back.

## 2014-06-05 NOTE — Telephone Encounter (Signed)
Patient and [place]banc [place] on three way call for coordiantion of dates.  Husband is a Company secretaryfireman and works 24 hour shifts.  First available date that works for them and does not interfer with their travel schedule is 06-30-14. Advised this date will work but time may change. They are agreeable. Will schedule and call back for instructions.  Case request for 06-30-14 at 1100 sent.

## 2014-06-06 NOTE — Telephone Encounter (Signed)
Call to patient and confirmed date of 06-30-14, currently scheduled for 11am but aware time is subject to change. Surgery instruction sheet reviewed and will mail to patient.    Routing to provider for final review. Patient agreeable to disposition. Will close encounter

## 2014-06-12 ENCOUNTER — Encounter (HOSPITAL_COMMUNITY): Payer: Self-pay | Admitting: *Deleted

## 2014-06-16 ENCOUNTER — Encounter (HOSPITAL_COMMUNITY): Payer: Self-pay | Admitting: Pharmacist

## 2014-06-30 ENCOUNTER — Encounter (HOSPITAL_COMMUNITY): Payer: 59 | Admitting: Anesthesiology

## 2014-06-30 ENCOUNTER — Ambulatory Visit (HOSPITAL_COMMUNITY)
Admission: RE | Admit: 2014-06-30 | Discharge: 2014-06-30 | Disposition: A | Discharge status: Home / Self Care | Payer: 59 | Source: Ambulatory Visit | Attending: Obstetrics & Gynecology | Admitting: Obstetrics & Gynecology

## 2014-06-30 ENCOUNTER — Ambulatory Visit (HOSPITAL_COMMUNITY): Payer: 59 | Admitting: Anesthesiology

## 2014-06-30 ENCOUNTER — Encounter (HOSPITAL_COMMUNITY)
Admission: RE | Disposition: A | Discharge status: Home / Self Care | Payer: Self-pay | Source: Ambulatory Visit | Attending: Obstetrics & Gynecology

## 2014-06-30 ENCOUNTER — Encounter (HOSPITAL_COMMUNITY): Payer: Self-pay | Admitting: *Deleted

## 2014-06-30 DIAGNOSIS — A6000 Herpesviral infection of urogenital system, unspecified: Secondary | ICD-10-CM | POA: Diagnosis not present

## 2014-06-30 DIAGNOSIS — D069 Carcinoma in situ of cervix, unspecified: Secondary | ICD-10-CM

## 2014-06-30 DIAGNOSIS — R87613 High grade squamous intraepithelial lesion on cytologic smear of cervix (HGSIL): Secondary | ICD-10-CM | POA: Insufficient documentation

## 2014-06-30 DIAGNOSIS — F411 Generalized anxiety disorder: Secondary | ICD-10-CM | POA: Insufficient documentation

## 2014-06-30 HISTORY — PX: LEEP: SHX91

## 2014-06-30 LAB — CBC
HCT: 39.8 % (ref 36.0–46.0)
HEMOGLOBIN: 13.9 g/dL (ref 12.0–15.0)
MCH: 32.8 pg (ref 26.0–34.0)
MCHC: 34.9 g/dL (ref 30.0–36.0)
MCV: 93.9 fL (ref 78.0–100.0)
Platelets: 229 10*3/uL (ref 150–400)
RBC: 4.24 MIL/uL (ref 3.87–5.11)
RDW: 12.8 % (ref 11.5–15.5)
WBC: 9.7 10*3/uL (ref 4.0–10.5)

## 2014-06-30 LAB — PREGNANCY, URINE: Preg Test, Ur: NEGATIVE

## 2014-06-30 SURGERY — LEEP (LOOP ELECTROSURGICAL EXCISION PROCEDURE)
Anesthesia: General

## 2014-06-30 MED ORDER — LACTATED RINGERS IV SOLN
INTRAVENOUS | Status: DC
  Administered 2014-06-30 (×2): via INTRAVENOUS

## 2014-06-30 MED ORDER — DEXAMETHASONE SODIUM PHOSPHATE 10 MG/ML IJ SOLN
INTRAMUSCULAR | Status: DC | PRN
  Administered 2014-06-30: 5 mg via INTRAVENOUS

## 2014-06-30 MED ORDER — PROPOFOL 10 MG/ML IV BOLUS
INTRAVENOUS | Status: DC | PRN
  Administered 2014-06-30: 200 mg via INTRAVENOUS

## 2014-06-30 MED ORDER — DEXAMETHASONE SODIUM PHOSPHATE 10 MG/ML IJ SOLN
INTRAMUSCULAR | Status: AC
  Filled 2014-06-30: qty 1

## 2014-06-30 MED ORDER — MIDAZOLAM HCL 2 MG/2ML IJ SOLN
INTRAMUSCULAR | Status: AC
  Filled 2014-06-30: qty 2

## 2014-06-30 MED ORDER — LIDOCAINE-EPINEPHRINE 2 %-1:100000 IJ SOLN
INTRAMUSCULAR | Status: AC
  Filled 2014-06-30: qty 1

## 2014-06-30 MED ORDER — FENTANYL CITRATE 0.05 MG/ML IJ SOLN
INTRAMUSCULAR | Status: DC | PRN
  Administered 2014-06-30: 100 ug via INTRAVENOUS

## 2014-06-30 MED ORDER — ONDANSETRON HCL 4 MG/2ML IJ SOLN
INTRAMUSCULAR | Status: AC
  Filled 2014-06-30: qty 2

## 2014-06-30 MED ORDER — ACETIC ACID 5 % SOLN
Status: DC | PRN
  Administered 2014-06-30: 1 via TOPICAL

## 2014-06-30 MED ORDER — LIDOCAINE HCL (CARDIAC) 20 MG/ML IV SOLN
INTRAVENOUS | Status: AC
  Filled 2014-06-30: qty 5

## 2014-06-30 MED ORDER — FENTANYL CITRATE 0.05 MG/ML IJ SOLN
INTRAMUSCULAR | Status: AC
  Filled 2014-06-30: qty 2

## 2014-06-30 MED ORDER — FERRIC SUBSULFATE SOLN
Status: DC | PRN
  Administered 2014-06-30: 1

## 2014-06-30 MED ORDER — HYDROCODONE-ACETAMINOPHEN 5-300 MG PO TABS: 1.0000 | ORAL_TABLET | Freq: Four times a day (QID) | ORAL | Status: DC

## 2014-06-30 MED ORDER — EPHEDRINE 5 MG/ML INJ
INTRAVENOUS | Status: AC
  Filled 2014-06-30: qty 10

## 2014-06-30 MED ORDER — MIDAZOLAM HCL 2 MG/2ML IJ SOLN
INTRAMUSCULAR | Status: DC | PRN
  Administered 2014-06-30: 2 mg via INTRAVENOUS

## 2014-06-30 MED ORDER — KETOROLAC TROMETHAMINE 30 MG/ML IJ SOLN
INTRAMUSCULAR | Status: DC | PRN
  Administered 2014-06-30: 30 mg via INTRAMUSCULAR
  Administered 2014-06-30: 30 mg via INTRAVENOUS

## 2014-06-30 MED ORDER — IODINE STRONG (LUGOLS) 5 % PO SOLN
ORAL | Status: AC
  Filled 2014-06-30: qty 1

## 2014-06-30 MED ORDER — KETOROLAC TROMETHAMINE 30 MG/ML IJ SOLN
INTRAMUSCULAR | Status: AC
  Filled 2014-06-30: qty 1

## 2014-06-30 MED ORDER — SCOPOLAMINE 1 MG/3DAYS TD PT72
1.0000 | MEDICATED_PATCH | Freq: Once | TRANSDERMAL | Status: DC
  Administered 2014-06-30: 1.5 mg via TRANSDERMAL

## 2014-06-30 MED ORDER — FENTANYL CITRATE 0.05 MG/ML IJ SOLN
INTRAMUSCULAR | Status: AC
  Filled 2014-06-30: qty 5

## 2014-06-30 MED ORDER — FERRIC SUBSULFATE 259 MG/GM EX SOLN
CUTANEOUS | Status: AC
  Filled 2014-06-30: qty 8

## 2014-06-30 MED ORDER — LIDOCAINE-EPINEPHRINE 1 %-1:100000 IJ SOLN
INTRAMUSCULAR | Status: AC
  Filled 2014-06-30: qty 1

## 2014-06-30 MED ORDER — LIDOCAINE HCL (CARDIAC) 20 MG/ML IV SOLN
INTRAVENOUS | Status: DC | PRN
  Administered 2014-06-30: 75 mg via INTRAVENOUS

## 2014-06-30 MED ORDER — PROPOFOL 10 MG/ML IV EMUL
INTRAVENOUS | Status: AC
  Filled 2014-06-30: qty 20

## 2014-06-30 MED ORDER — SCOPOLAMINE 1 MG/3DAYS TD PT72
MEDICATED_PATCH | TRANSDERMAL | Status: DC
Start: 2014-06-30 — End: 2014-06-30
  Filled 2014-06-30: qty 1

## 2014-06-30 MED ORDER — LIDOCAINE-EPINEPHRINE 2 %-1:100000 IJ SOLN
INTRAMUSCULAR | Status: DC | PRN
  Administered 2014-06-30: 10 mL via INTRADERMAL

## 2014-06-30 MED ORDER — ACETIC ACID 5 % SOLN
Status: AC
  Filled 2014-06-30: qty 500

## 2014-06-30 MED ORDER — ONDANSETRON HCL 4 MG/2ML IJ SOLN
INTRAMUSCULAR | Status: DC | PRN
  Administered 2014-06-30: 4 mg via INTRAVENOUS

## 2014-06-30 MED ORDER — LIDOCAINE-EPINEPHRINE (PF) 2 %-1:200000 IJ SOLN
INTRAMUSCULAR | Status: AC
  Filled 2014-06-30: qty 20

## 2014-06-30 SURGICAL SUPPLY — 33 items
APPLICATOR COTTON TIP 6IN STRL (MISCELLANEOUS) ×3 IMPLANT
CATH ROBINSON RED A/P 16FR (CATHETERS) ×3 IMPLANT
CLOTH BEACON ORANGE TIMEOUT ST (SAFETY) ×3 IMPLANT
ELECT BALL LEEP 5MM RED (ELECTRODE) ×2 IMPLANT
ELECT LOOP LEEP RND 15X12 GRN (CUTTING LOOP) ×3
ELECT LOOP LEEP RND 20X12 WHT (CUTTING LOOP)
ELECT REM PT RETURN 9FT ADLT (ELECTROSURGICAL) ×3
ELECTRODE LOOP LP RND 15X12GRN (CUTTING LOOP) IMPLANT
ELECTRODE LOOP LP RND 20X12WHT (CUTTING LOOP) IMPLANT
ELECTRODE REM PT RTRN 9FT ADLT (ELECTROSURGICAL) ×1 IMPLANT
EVACUATOR PREFILTER SMOKE (MISCELLANEOUS) ×3 IMPLANT
EXTENDER ELECT LOOP LEEP 10CM (CUTTING LOOP) IMPLANT
GAUZE SPONGE 4X4 16PLY XRAY LF (GAUZE/BANDAGES/DRESSINGS) IMPLANT
GLOVE BIOGEL PI IND STRL 7.0 (GLOVE) ×1 IMPLANT
GLOVE BIOGEL PI INDICATOR 7.0 (GLOVE) ×2
GLOVE ECLIPSE 6.5 STRL STRAW (GLOVE) ×6 IMPLANT
GOWN STRL REUS W/TWL LRG LVL3 (GOWN DISPOSABLE) ×6 IMPLANT
HOSE NS SMOKE EVAC 7/8 X6 (MISCELLANEOUS) ×2 IMPLANT
HOSE NS SMOKE EVAC 7/8 X6' (MISCELLANEOUS) ×1
NDL SPNL 22GX3.5 QUINCKE BK (NEEDLE) ×1 IMPLANT
NEEDLE SPNL 22GX3.5 QUINCKE BK (NEEDLE) ×3 IMPLANT
NS IRRIG 1000ML POUR BTL (IV SOLUTION) ×3 IMPLANT
PACK VAGINAL MINOR WOMEN LF (CUSTOM PROCEDURE TRAY) ×3 IMPLANT
PAD OB MATERNITY 4.3X12.25 (PERSONAL CARE ITEMS) ×3 IMPLANT
PENCIL BUTTON HOLSTER BLD 10FT (ELECTRODE) ×3 IMPLANT
REDUCER FITTING SMOKE EVAC (MISCELLANEOUS) ×3 IMPLANT
SCOPETTES 8  STERILE (MISCELLANEOUS) ×4
SCOPETTES 8 STERILE (MISCELLANEOUS) ×2 IMPLANT
SYRINGE CONTROL L 12CC (SYRINGE) ×3 IMPLANT
SYRINGE CONTROL LL 12CC (SYRINGE) ×1 IMPLANT
TOWEL OR 17X24 6PK STRL BLUE (TOWEL DISPOSABLE) ×6 IMPLANT
TUBING SMOKE EVAC HOSE ADAPTER (MISCELLANEOUS) ×3 IMPLANT
WATER STERILE IRR 1000ML POUR (IV SOLUTION) ×3 IMPLANT

## 2014-06-30 NOTE — H&P (Signed)
Carly Suarez is an 28 y.o. female G0 MWF with progressing cervical dysplasia here for LEEP with colposcopy under anesthesia.  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  Patient is very difficult to examine in the office for any length of time due to anxiety.  Despite use of pre-procedure Ativan, her colposcopy was quite difficult due to patient movement.  For her comfort and for efficiency, LEEP procedure in OR was recommended.  Pt in agreement and here for this today.  Pertinent Gynecological History: Menses: regular on OCPs Bleeding: normal Contraception: OCP (estrogen/progesterone) DES exposure: denies Blood transfusions: none Sexually transmitted diseases: HPV Previous GYN Procedures: colposcopy x 2  Last mammogram: n/a Date: n/a Last pap: abnormal: HGSIL Date: 05/14/14 OB History: G0, P0   Menstrual History: No LMP recorded.    Past Medical History  Diagnosis Date  . ADD (attention deficit disorder)   . History of chicken pox   . History of shingles     Past Surgical History  Procedure Laterality Date  . No past surgeries      Family History  Problem Relation Age of Onset  . Hypertension Mother   . Hypertension Father   . Prostate cancer Father 2061  . Melanoma Father [phone]  . Diabetes Maternal Grandmother   . Arthritis Maternal Grandmother   . Hypertension Maternal Grandmother   . Prostate cancer Maternal Grandfather   . Heart disease Paternal Grandmother   . Other Brother [phone]    Batten Disease - gentic disorder  - attacks brain and nervous system     Social History:  reports that she has never smoked. She has never used smokeless tobacco. She reports that she drinks alcohol. She reports that she does not use illicit drugs.  Allergies: No Known Allergies  No prescriptions prior to admission    ROS  There were no vitals taken for this visit. Physical Exam  Constitutional: She is oriented to person, place, and  time. She appears well-developed and well-nourished.  Cardiovascular: Normal rate and regular rhythm.   Respiratory: Effort normal.  Neurological: She is alert and oriented to person, place, and time.  Skin: Skin is warm and dry.  Psychiatric: She has a normal mood and affect.    No results found for this or any previous visit (from the past 24 hour(s)).  No results found.  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  Risks reviewed with pt including bleeding, 10% failure rate and need for future procedures, cervical scarring and possible fertility issues.  All questions answered and pt here and ready to proceed.  Valentina ShaggyMILLER, Tamar Miano SUZANNE 06/30/2014, 7:09 AM

## 2014-06-30 NOTE — Anesthesia Preprocedure Evaluation (Signed)
Anesthesia Evaluation  Patient identified by MRN, date of birth, ID band Patient awake    Reviewed: Allergy & Precautions, H&P , NPO status , Patient's Chart, lab work & pertinent test results  Airway Mallampati: II TM Distance: >3 FB Neck ROM: Full    Dental no notable dental hx. (+) Teeth Intact   Pulmonary neg pulmonary ROS,  breath sounds clear to auscultation  Pulmonary exam normal       Cardiovascular negative cardio ROS  Rhythm:Regular Rate:Normal     Neuro/Psych PSYCHIATRIC DISORDERS Anxiety ADD   GI/Hepatic negative GI ROS, Neg liver ROS,   Endo/Other  negative endocrine ROS  Renal/GU negative Renal ROS  negative genitourinary   Musculoskeletal negative musculoskeletal ROS (+)   Abdominal   Peds  Hematology negative hematology ROS (+)   Anesthesia Other Findings   Reproductive/Obstetrics HGS neoplasia HSV                           Anesthesia Physical Anesthesia Plan  ASA: II  Anesthesia Plan: General   Post-op Pain Management:    Induction: Intravenous  Airway Management Planned: LMA  Additional Equipment:   Intra-op Plan:   Post-operative Plan: Extubation in OR  Informed Consent: I have reviewed the patients History and Physical, chart, labs and discussed the procedure including the risks, benefits and alternatives for the proposed anesthesia with the patient or authorized representative who has indicated his/her understanding and acceptance.   Dental advisory given  Plan Discussed with: CRNA, Anesthesiologist and Surgeon  Anesthesia Plan Comments:         Anesthesia Quick Evaluation

## 2014-06-30 NOTE — Discharge Instructions (Signed)
Post-surgical Instructions, Outpatient Surgery  You may expect to feel dizzy, weak, and drowsy for as long as 24 hours after receiving the medicine that made you sleep (anesthetic). For the first 24 hours after your surgery:    Do not drive a car, ride a bicycle, participate in physical activities, or take public transportation until you are done taking narcotic pain medicines or as directed by Dr. Hyacinth MeekerMiller.   Do not drink alcohol or take tranquilizers.   Do not take medicine that has not been prescribed by your physicians.   Do not sign important papers or make important decisions while on narcotic pain medicines.   Have a responsible person with you.    PAIN MANAGEMENT  Motrin 800mg .  (This is the same as 4-200mg  over the counter tablets of Motrin or ibuprofen.)  You may take this every eight hours or as needed for cramping.    Vicodin 5/300mg .  For more severe pain, take one or two tablets every four to six hours as needed for pain control.  (Remember that narcotic pain medications increase your risk of constipation.  If this becomes a problem, you may take an over the counter stool softener like Colace 100mg  up to four times a day.)  DO'S AND DON'T'S  Do not take a tub bath for one week.  You may shower on the first day after your surgery  Do not do any heavy lifting for one to two weeks.  This increases the chance of bleeding.  Do move around as you feel able.  Stairs are fine.  You may begin to exercise again as you feel able.  Do not lift any weights for two weeks.  Do not put anything in the vagina for two weeks--no tampons, intercourse, or douching.    REGULAR MEDIATIONS/VITAMINS:  You may restart all of your regular medications as prescribed.  You may restart all of your vitamins as you normally take them.    PLEASE CALL OR SEEK MEDICAL CARE IF:  You have persistent nausea and vomiting.   You have trouble eating or drinking.   You have an oral temperature above  100.5.   You have constipation that is not helped by adjusting diet or increasing fluid intake. Pain medicines are a common cause of constipation.   You have heavy vaginal bleeding  You have redness or drainage from your incision(s) or there is increasing pain or tenderness near or in the surgical site.    MAY TAKE IBUPROFEN (MOTRIN, ADVIL) OR ALEVE AFTER 4:15 PM FOR CRAMPS!!!

## 2014-06-30 NOTE — Anesthesia Postprocedure Evaluation (Signed)
  Anesthesia Post-op Note  Patient: Carly Suarez  Procedure(s) Performed: Procedure(s): LOOP ELECTROSURGICAL EXCISION PROCEDURE (LEEP) (N/A)  Patient Location: PACU  Anesthesia Type:General  Level of Consciousness: awake, alert  and oriented  Airway and Oxygen Therapy: Patient Spontanous Breathing  Post-op Pain: none  Post-op Assessment: Post-op Vital signs reviewed, Patient's Cardiovascular Status Stable, Respiratory Function Stable, Patent Airway, No signs of Nausea or vomiting and Pain level controlled  Post-op Vital Signs: Reviewed and stable  Last Vitals:  Filed Vitals:   06/30/14 1045  BP: 148/84  Pulse: 91  Temp:   Resp: 18    Complications: No apparent anesthesia complications

## 2014-06-30 NOTE — Op Note (Signed)
06/30/2014  10:38 AM  PATIENT:  Carly Suarez  28 y.o. 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  I personally labeled the pathology report.  5 pieces of cervix were sent.  Largest was from mid portion with an additional specimen from the 3, 6, and 9 o'clock positions. A final deeper portion was obtained as well.  DISPOSITION OF SPECIMEN:  PATHOLOGY  FINDINGS: Areas of AWE with mosaicism noted at 12 and 6 o'clock positions with additional AWE around the cervix. This is consistent with the colposcopic findings noted in-office.  DESCRIPTION OF OPERATION:   Pt taken to the OR and placed in the supine position.  SCDs present and functioning.  After adequate anesthesia was present, the legs were placed in the lithotomy position and lifted to the high lithotomy position.  Under stile conditions an I&O catheterization was performed for 150cc clear UOP.  The rest of the procedure was not done under sterile conditions as it would not be done in the OR under sterile conditions.  Grounding pad was present on the left thigh.  Cautery settings:50 cut/60coagulation.  Suction applied to coated speculum and speculum placed in the vaginal.  Good visualization of the cervix was present.  Repeat colposcopy was performed showing AWE around the cervix and areas of mosaicism at 12 and 6 o'clock.  Cervix anesthetized using 2% Xylocaine with 1:100,000units Epinephrine.  5 cc's total used.  Entire transition zone with 12x4015mm loop in 1 passes.  Additional specimens at the 3, 6, and 9 o'clock positions were obtained.  Then a deeper specimen was obtained.  Total of five  specimen(s) placed on cork and labeled for pathology.  Hemostasis obtained with ball cautery and Monsel's solution.  Pt tolerated procedure well.  Toradol IV/IM was given.  All instruments were removed from the vaginal.  Pt's legs were positioned back in supine position.  Sponge, lap, needle, and instruments counts were correct x 2.  Pt tolerated procedure very well and was awakened from anesthesia in stable condition and taken to the recovery room.    COUNTS:  YES  PLAN OF CARE: Transfer to PACU

## 2014-06-30 NOTE — Transfer of Care (Signed)
Immediate Anesthesia Transfer of Care Note  Patient: Carly Suarez  Procedure(s) Performed: Procedure(s): LOOP ELECTROSURGICAL EXCISION PROCEDURE (LEEP) (N/A)  Patient Location: PACU  Anesthesia Type:General  Level of Consciousness: awake, alert  and oriented  Airway & Oxygen Therapy: Patient Spontanous Breathing and Patient connected to nasal cannula oxygen  Post-op Assessment: Report given to PACU RN and Post -op Vital signs reviewed and stable  Post vital signs: Reviewed and stable  Complications: No apparent anesthesia complications

## 2014-07-01 ENCOUNTER — Encounter (HOSPITAL_COMMUNITY): Payer: Self-pay | Admitting: Obstetrics & Gynecology

## 2014-07-03 ENCOUNTER — Telehealth: Payer: Self-pay | Admitting: Obstetrics & Gynecology

## 2014-07-03 NOTE — Telephone Encounter (Signed)
Spoke with patient. Two week follow up scheduled for 8/24 at 3pm with Dr.Miller. Patient agreeable to date and time.  Routing to provider for final review. Patient agreeable to disposition. Will close encounter.

## 2014-07-03 NOTE — Telephone Encounter (Signed)
Pt calling to schedule 2 week [facility]

## 2014-07-04 ENCOUNTER — Telehealth: Payer: Self-pay

## 2014-07-04 NOTE — Telephone Encounter (Signed)
Spoke with patient. Advised of results as seen below from Dr.Miller. Patient is agreeable and verbalizes understanding. Appointment scheduled for 9/10 at 10am for four week follow up. Appointment scheduled for 2/11 at 9:15am for 6 month repeat pap. Patient agreeable to dates and times. 06 recall entered.  Routing to provider for final review. Patient agreeable to disposition. Will close encounter

## 2014-07-04 NOTE — Telephone Encounter (Signed)
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  She has a 2 week f/u and it should be changed to 4 weeks.  I told her if margins were negative I'd do a pap in 1 year but as her pathology showed almost all CIN 3, i'd like a repeat Pap only (no hpv testing) 6 months. We can talk about this when she comes for follow up.  Thanks. ------

## 2014-07-14 ENCOUNTER — Ambulatory Visit: Payer: 59 | Admitting: Obstetrics & Gynecology

## 2014-07-16 ENCOUNTER — Emergency Department: Payer: Self-pay | Admitting: Emergency Medicine

## 2014-07-16 LAB — BASIC METABOLIC PANEL
Anion Gap: 9 (ref 7–16)
BUN: 14 mg/dL (ref 7–18)
CHLORIDE: 107 mmol/L (ref 98–107)
Calcium, Total: 9.1 mg/dL (ref 8.5–10.1)
Co2: 27 mmol/L (ref 21–32)
Creatinine: 0.98 mg/dL (ref 0.60–1.30)
EGFR (African American): >60
EGFR (Non-African Amer.): >60
Glucose: 109 mg/dL — ABNORMAL HIGH (ref 65–99)
OSMOLALITY: 286 (ref 275–301)
POTASSIUM: 3.7 mmol/L (ref 3.5–5.1)
Sodium: 143 mmol/L (ref 136–145)

## 2014-07-16 LAB — CBC
HCT: 40.9 % (ref 35.0–47.0)
HGB: 13.9 g/dL (ref 12.0–16.0)
MCH: 32.2 pg (ref 26.0–34.0)
MCHC: 34 g/dL (ref 32.0–36.0)
MCV: 95 fL (ref 80–100)
PLATELETS: 237 10*3/uL (ref 150–440)
RBC: 4.32 10*6/uL (ref 3.80–5.20)
RDW: 12.4 % (ref 11.5–14.5)
WBC: 11.6 10*3/uL — AB (ref 3.6–11.0)

## 2014-07-16 LAB — HCG, QUANTITATIVE, PREGNANCY: Beta Hcg, Quant.: <1 m[IU]/mL — ABNORMAL LOW

## 2014-07-23 ENCOUNTER — Other Ambulatory Visit: Payer: Self-pay | Admitting: Obstetrics & Gynecology

## 2014-07-23 MED ORDER — VALACYCLOVIR HCL 1 G PO TABS: 1000.0000 mg | ORAL_TABLET | Freq: Two times a day (BID) | ORAL | Status: DC

## 2014-07-23 NOTE — Telephone Encounter (Signed)
Last AEX: 05/12/14 Last refill:06/16/14 Current AEX:05/26/15  Please advise

## 2014-07-23 NOTE — Telephone Encounter (Addendum)
Patient is requesting a refill of valACYclovir (VALTREX) 1000 MG tablet. Pharmacy on file. Patient needs ASAP. I you need to reach patient please try to call before 10:00am. Patient just got off of work and will be going to bed soon.

## 2014-07-31 ENCOUNTER — Ambulatory Visit (INDEPENDENT_AMBULATORY_CARE_PROVIDER_SITE_OTHER): Payer: 59 | Admitting: Obstetrics & Gynecology

## 2014-07-31 VITALS — BP 126/80 | HR 68 | Resp 16 | Wt 136.8 lb

## 2014-07-31 DIAGNOSIS — D069 Carcinoma in situ of cervix, unspecified: Secondary | ICD-10-CM

## 2014-07-31 DIAGNOSIS — B009 Herpesviral infection, unspecified: Secondary | ICD-10-CM | POA: Insufficient documentation

## 2014-07-31 MED ORDER — VALACYCLOVIR HCL 1 G PO TABS: 1000.0000 mg | ORAL_TABLET | Freq: Every day | ORAL | Status: DC

## 2014-07-31 NOTE — Progress Notes (Signed)
Subjective:     Patient ID: Carly Suarez, female   DOB: [birth year], 28 y.o.   MRN: [id number]  HPI 28 yo G0 MWF here for follow up after LEEP.  Pt did end up going to [facility] due to Aug 26th.  Had filled two pads with bright red bleeding and some clots.  Ultrasound showed small ovarian cyst.  Pt was told this was the cause of bleeding.  Bleeding stopped as quickly as it started without any treatment.  D/W pt this sounds more like bleeding from the cone bed.  Pt has had two more HSV outbreaks in the last two months.  We have discussed suppressive therapy.  She is ready to proceed with this.  Dosage differences of  or 1gm discussed.  She takes the 1 gram bid for outbreaks and has had no side effects.  Will proceed with this.   Review of Systems  All other systems reviewed and are negative.      Objective:   Physical Exam  Constitutional: She is oriented to person, place, and time. She appears well-developed and well-nourished.  Genitourinary: Vagina normal. There is no rash, tenderness or lesion on the right labia. There is no rash, tenderness or lesion on the left labia.  Cervix normal with well healed ectropion.  Neurological: She is alert and oriented to person, place, and time.  Skin: Skin is warm and dry.  Psychiatric: She has a normal mood and affect.       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  Rx for 90 days for pharmacy. Repeat pap only 6 months then pap with HR HPV 1 year.

## 2014-08-03 ENCOUNTER — Encounter: Payer: Self-pay | Admitting: Obstetrics & Gynecology

## 2014-08-05 ENCOUNTER — Telehealth: Payer: Self-pay

## 2014-08-05 NOTE — Telephone Encounter (Signed)
Lmtcb//kn 

## 2014-08-08 NOTE — Telephone Encounter (Signed)
Pt is calling saying that the bumps showed back up last night and dr Hyacinth Meeker told her to call and get an appointment with her when they showed up so that she could figure out what they were.

## 2014-08-08 NOTE — Telephone Encounter (Signed)
Patient states that she is having a recurrence of lesions on her labia that patient feels are not hsv related.  Patient states that they are hard bumps (2) that are on inner labia that come and go. Sometimes feels discomfort with sitting but no drainage or discharge. Does not have constant pain. Patient noticed two areas last night and states that they have a pinpoint head area that are oily. Patient staes she has discussed this with Dr. Hyacinth Meeker before and that she was advised to call with next occurrence for visit.   Patient working night shift coming up and has very limited availability. Office visit with Lauro Franklin, FNP scheduled for Monday, 08/11/14 at 1015.  Patient will call early Monday if symptoms are resolved and she decides if she needs office visit, advised she can cancel, just speak with one of nurses to cancel. She is also advised to call back with any concerns or change in symptoms. She is agreeable.  Routing to provider for final review. Patient agreeable to disposition. Will close encounter   cc Lauro Franklin, FNP

## 2014-08-08 NOTE — Telephone Encounter (Signed)
Left message to call Kaitlyn at [phone]. 

## 2014-08-11 ENCOUNTER — Telehealth: Payer: Self-pay | Admitting: Nurse Practitioner

## 2014-08-11 ENCOUNTER — Ambulatory Visit: Payer: 59 | Admitting: Nurse Practitioner

## 2014-08-11 NOTE — Telephone Encounter (Signed)
Pt called an canceled her appt for this morning. Pt says that things are better and doesn't feel like she needs to come in.

## 2014-11-18 ENCOUNTER — Telehealth: Payer: Self-pay | Admitting: Obstetrics & Gynecology

## 2014-11-18 NOTE — Telephone Encounter (Signed)
Patient requesting an appointment with Dr. Hyacinth MeekerMiller for "a knot down there." Please advise?

## 2014-11-18 NOTE — Telephone Encounter (Signed)
Spoke with patient. Patient states that she has a "recurring knot" on her inner labia that comes and goes. "We have been trying to figure out what it is. I don't think I should have to explain much it is there in my chart." Appointment scheduled for tomorrow with Dr.Miller at 2pm Agreeable to date and time.  Routing to provider for final review. Patient agreeable to disposition. Will close encounter

## 2014-11-19 ENCOUNTER — Ambulatory Visit (INDEPENDENT_AMBULATORY_CARE_PROVIDER_SITE_OTHER): Payer: BC Managed Care – PPO | Admitting: Obstetrics & Gynecology

## 2014-11-19 ENCOUNTER — Encounter: Payer: Self-pay | Admitting: Obstetrics & Gynecology

## 2014-11-19 VITALS — BP 122/84 | HR 60 | Resp 16 | Wt 139.4 lb

## 2014-11-19 DIAGNOSIS — N764 Abscess of vulva: Secondary | ICD-10-CM

## 2014-11-19 MED ORDER — LIDOCAINE 5 % EX OINT: 1.0000 "application " | TOPICAL_OINTMENT | Freq: Three times a day (TID) | CUTANEOUS | Status: DC

## 2014-11-19 NOTE — Progress Notes (Signed)
Subjective:     Patient ID: Carly Suarez, female   DOB: 06/04/1986, 28 y.o.   MRN: [id number]  HPI 28 yo G0 Here for complaint of labial minora abscess that started around Christmas day.  This is a recurrent issue and pt and I have discussed either excision or at least I&D.  Pt feels it is much better today and may not "be worth it".  Minimal pain today.  No drainage.  No change in sexual partners.  Denies fever.  Review of Systems     Objective:   Physical Exam  Constitutional: She is oriented to person, place, and time. She appears well-developed and well-nourished.  Abdominal: Soft. Bowel sounds are normal.  Genitourinary:     Neurological: She is alert and oriented to person, place, and time.  Skin: Skin is warm and dry.  Psychiatric: She has a normal mood and affect.       Assessment:     Labial minora abscess     Plan:     Pt declines I&D or excision of labial minor lesion.    Will schedule pt for visit same day or day after she calls if at all possible for I&D or excision of lesion with the next occurrence.   Rx for topical Lidocaine 5% to be applied an hour before she comes to help with pain control. Cetaphil or antibacterial soap recommended to decrease skin bacteria in hopes this might help.

## 2015-01-01 ENCOUNTER — Ambulatory Visit (INDEPENDENT_AMBULATORY_CARE_PROVIDER_SITE_OTHER): Payer: BLUE CROSS/BLUE SHIELD | Admitting: Obstetrics & Gynecology

## 2015-01-01 VITALS — BP 106/76 | HR 78 | Resp 16 | Ht 63.0 in | Wt 138.4 lb

## 2015-01-01 DIAGNOSIS — D069 Carcinoma in situ of cervix, unspecified: Secondary | ICD-10-CM

## 2015-01-04 ENCOUNTER — Encounter: Payer: Self-pay | Admitting: Obstetrics & Gynecology

## 2015-01-04 NOTE — Progress Notes (Signed)
Subjective:     Patient ID: Carly Suarez, female   DOB: 09/22/1986, 29 y.o.   MRN: [id number]  HPI 29 yo G0 MWF here for follow-up Pap after underdoign LEEP in [place] 07/10/14 with final pathology showing CIN3 with negative margins.  Pap before this was HGSIL.  Pt and I discussed this pap would be a Pap only and the next one, one year after procedure would be a Pap with HR HPV testing.  At that time, if either are abnormal, she will need a repeat colposcopy.  With this pap, only if there are still high grade cells, would I proceed with a colposcopy.  Pt is anxious to know if her HPV has cleared.  We had a lengthy discussion on this specifically regarding the pros/cons of having an HPV test now and what the actual guidelines recommend.  We ultimately decided, together, to just proceed with Pap only.    Cycles have been normal.  Pt denies vaginal discharge or any abnormal bleeding.  She denies sexual partner change.  Review of Systems  All other systems reviewed and are negative.      Objective:   Physical Exam  Constitutional: She is oriented to person, place, and time. She appears well-developed and well-nourished.  Abdominal: Soft. Bowel sounds are normal.  Genitourinary: Vagina normal. There is no rash, tenderness or lesion on the right labia. There is no rash, tenderness or lesion on the left labia. Cervix exhibits no motion tenderness, no discharge and no friability.  Well healed ectropion on cervix.  Pap obtained.  Lymphadenopathy:       Right: No inguinal adenopathy present.       Left: No inguinal adenopathy present.  Neurological: She is oriented to person, place, and time.  Skin: Skin is warm and dry.  Psychiatric: She has a normal mood and affect.       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  Pt will be called with results.   ~20 minutes spent with patient >50% of time was in face to face  discussion pros/cons of HPV testing at the six month interval after her LEEP.  See above note as well.

## 2015-01-05 LAB — IPS PAP TEST WITH REFLEX TO HPV

## 2015-01-07 ENCOUNTER — Telehealth: Payer: Self-pay | Admitting: Emergency Medicine

## 2015-01-07 NOTE — Telephone Encounter (Signed)
Patient given message from Dr. Hyacinth MeekerMiller, verbalized understanding.Carly Suarez. She will call back prn.

## 2015-01-07 NOTE — Telephone Encounter (Signed)
Message left to return call to Macaiah Mangal at [phone].    

## 2015-01-07 NOTE — Telephone Encounter (Signed)
-----   Message from Annamaria BootsMary Suzanne Miller, MD sent at 01/06/2015  6:17 PM EST ----- Will you please inform pt her pap is negative.  She had a HGSIL pap with CIN3 on LEEP.  This is her first pap and she has some anxiety about it being normal.  She asked that we call and not send a message through MyChart.  Thanks.  Out of current recall.  Needs 08 for AEX which is already scheduled.

## 2015-01-08 ENCOUNTER — Ambulatory Visit (INDEPENDENT_AMBULATORY_CARE_PROVIDER_SITE_OTHER): Payer: BLUE CROSS/BLUE SHIELD | Admitting: Obstetrics & Gynecology

## 2015-01-08 ENCOUNTER — Telehealth: Payer: Self-pay | Admitting: Obstetrics & Gynecology

## 2015-01-08 VITALS — BP 130/88 | HR 64 | Resp 16 | Wt 139.0 lb

## 2015-01-08 DIAGNOSIS — N9089 Other specified noninflammatory disorders of vulva and perineum: Secondary | ICD-10-CM

## 2015-01-08 NOTE — Telephone Encounter (Signed)
Patient calling stating, "I have a recurrent bump Dr. Hyacinth MeekerMiller told me to come in to see her for next time is comes back and I'd like to come today."

## 2015-01-08 NOTE — Telephone Encounter (Signed)
Spoke with patient. States she is having a recurrence of a labial abcess that she has been evaluated for prior. Patient states pain is increasing. Has lidocaine ointment that she will use prior to appointment today, 1245 time per Billie RuddySally Yeakley, RN. Patient agreeable to plan. Routing to provider for final review. Patient agreeable to disposition. Will close encounter

## 2015-01-09 ENCOUNTER — Ambulatory Visit (INDEPENDENT_AMBULATORY_CARE_PROVIDER_SITE_OTHER): Payer: BLUE CROSS/BLUE SHIELD | Admitting: Obstetrics & Gynecology

## 2015-01-09 VITALS — BP 98/58 | HR 68 | Resp 12

## 2015-01-09 DIAGNOSIS — N9089 Other specified noninflammatory disorders of vulva and perineum: Secondary | ICD-10-CM

## 2015-01-11 ENCOUNTER — Encounter: Payer: Self-pay | Admitting: Obstetrics & Gynecology

## 2015-01-11 NOTE — Progress Notes (Signed)
Subjective:     Patient ID: Carly Suarez, female   DOB: 11/05/1986, 29 y.o.   MRN: [id number]  HPI 29 yo G0 MWF here for recurrent vulvar issues.  Pt has had multiple recurrent tender vulvar lesions that when she comes in to see me, I cannot really see much on the labia.  What I have seen does seem, in regards to pain, out of proportion to physical exam.  Pt reports she's had a lesion today that she'd like me to look at and see if I have any new ideas.  Pt has a hx of vulvar HSV.  She is on Valtrex for suppression.  Review of Systems  All other systems reviewed and are negative.      Objective:   Physical Exam  Constitutional: She is oriented to person, place, and time. She appears well-developed and well-nourished.  Genitourinary:    There is tenderness and lesion on the right labia. There is no rash or injury on the right labia. There is no rash, tenderness, lesion or injury on the left labia.  Lymphadenopathy:       Right: No inguinal adenopathy present.       Left: No inguinal adenopathy present.  Neurological: She is alert and oriented to person, place, and time.  Skin: Skin is warm and dry.  Psychiatric: She has a normal mood and affect.   D/W pt possible excision or at least opening lesion to see if there is anything inside the lesion.  Consent obtained.  With attempt at placement of Lidocaine instillation, pt screamed and kicked, causing syring that was injected into labia to then be dislodged from my hand, sticking in my arm.  Pt was made aware and did consent to testing for STD screening.  Pt was very upset at this point and procedure was ended.  Spouse was available and came for discussion regarding attempt that could possibly be made tomorrow.  Will pre treat with Valium 10mg  and use topical Lidocaine.    Assessment:     Recurrent vulvar lesions HSV 2     Plan:     Pt will return with spouse tomorrow for repeat attempt at excision and/or exploration of lesion

## 2015-01-11 NOTE — Progress Notes (Signed)
Subjective:     Patient ID: Carly Suarez, female   DOB: 12/22/1985, 29 y.o.   MRN: [id number]  HPI 29 yo G0 MWF here for re-attempt at I&D or excision of vulvar tenderness.  Attempt was made yesterday without success due to patient anxiety.  Pt did take valium before coming today.  Husband is with her in the exam room.   Review of Systems  All other systems reviewed and are negative.      Objective:   Physical Exam  Constitutional: She is oriented to person, place, and time. She appears well-developed and well-nourished.  Genitourinary:    There is tenderness and lesion on the right labia. There is no rash or injury on the right labia. There is no rash, tenderness, lesion or injury on the left labia.  Lymphadenopathy:       Right: No inguinal adenopathy present.       Left: No inguinal adenopathy present.  Neurological: She is alert and oriented to person, place, and time.  Skin: Skin is warm and dry.  Psychiatric: She has a normal mood and affect.   Skin culture and HSV culture obtained.  Lesion cleansed with Betadine x 3.  With attempt at placement of Lidocaine instillation, pt again screamed and pushed up the table.  This was attempted four separate times.  With the last attempt, pt yelled at her husband, my CMA, and myself saying "you will not hold me down".  I advised we stop this and just await the culture to see if any results are positive.  Possibly these are recurrent HSV lesions.    Assessment:     Recurrent vulvar lesions HSV 2     Plan:     Skin culture and HSV culture obtained

## 2015-01-12 ENCOUNTER — Other Ambulatory Visit: Payer: Self-pay

## 2015-01-12 LAB — WOUND CULTURE
Gram Stain: NONE SEEN
Gram Stain: NONE SEEN

## 2015-01-12 LAB — HERPES SIMPLEX VIRUS CULTURE: ORGANISM ID, BACTERIA: NOT DETECTED

## 2015-01-12 MED ORDER — SULFAMETHOXAZOLE-TRIMETHOPRIM 800-160 MG PO TABS: 1.0000 | ORAL_TABLET | Freq: Two times a day (BID) | ORAL | Status: DC

## 2015-01-12 NOTE — Telephone Encounter (Signed)
Patient notified of results. Will start antibiotic and use dial soap. Dr Edward JollySilva will you review Rx to make sure I put in medication correctly.//kn

## 2015-01-12 NOTE — Telephone Encounter (Signed)
-----   Message from Annamaria BootsMary Suzanne Miller, MD sent at 01/12/2015  8:13 AM EST ----- Can you please call pt?  I did a repeat HSV to see if this is recurrent HSV.  That is still pending.  I also did a skin culture and e coli grew from it??  I'm surprised by this.  I think we should try a course of antibiotics in case she has some skin colonization.  Pt needs bactrim ds bid x 7 days.  Also, I think she should start using an antibacterial soap--Cetaphil or Dial (bar not body wash).  Maybe this is the cause of all of this.

## 2015-01-12 NOTE — Telephone Encounter (Signed)
The Bactrim DS prescription is correct.  Please inform patient of negative HSV culture.  She will need a recheck with Dr. Hyacinth MeekerMiller in about 10 days.

## 2015-01-19 ENCOUNTER — Telehealth: Payer: Self-pay | Admitting: Obstetrics & Gynecology

## 2015-01-19 NOTE — Telephone Encounter (Signed)
Patient called to cancel her recheck with Dr. Hyacinth MeekerMiller for 01/20/15 due to a scheduling change at work. She needs to reschedule but we will need to call her back with an available slot. I offered her 01/26/15 at 12:45 but she is not available.  Cc: Sande RivesKelly and Sally for assisting with working patient back into the schedule.

## 2015-01-20 ENCOUNTER — Ambulatory Visit: Payer: BLUE CROSS/BLUE SHIELD | Admitting: Obstetrics & Gynecology

## 2015-01-23 NOTE — Telephone Encounter (Signed)
Call to patient. Appointment rescheduled to 02-02-15 at 1:30. Patient declined earlier appointment due to work schedule.  Routing to provider for final review. Patient agreeable to disposition. Will close encounter

## 2015-02-02 ENCOUNTER — Encounter: Payer: Self-pay | Admitting: Obstetrics & Gynecology

## 2015-02-02 ENCOUNTER — Ambulatory Visit (INDEPENDENT_AMBULATORY_CARE_PROVIDER_SITE_OTHER): Payer: BLUE CROSS/BLUE SHIELD | Admitting: Obstetrics & Gynecology

## 2015-02-02 VITALS — BP 112/84 | HR 68 | Ht 63.0 in | Wt 142.0 lb

## 2015-02-02 DIAGNOSIS — N764 Abscess of vulva: Secondary | ICD-10-CM

## 2015-02-02 NOTE — Progress Notes (Signed)
Subjective:     Patient ID: Carly Suarez, female   DOB: 01/02/1986, 29 y.o.   MRN: [id number]  HPI 29 yo G0 MWF here for follow-up after having e coli positive skin culture with hx of recurrent labia minora abscesses.  These are always very small but they are very painful to pt.  She did finish her Cipro which she took for a week.  Has considered her bathing habits and wonders if loofa sponge is contributing.  Uses this all the time and it never dries out.  States she will stop using this.  She wants to use some kind of cloth so I advised something that will dry out between showers or new washcloth with every shower/bath.  Pt in agreement.  No shaving recommended or new razor blade with every visit.  Antibacterial soaps discussed.  Intercourse and possible contributions to this skin culture discussed.  Pt voiced clear understanding.  States she is not having any new issues and area on labia is resolved.    Review of Systems  All other systems reviewed and are negative.      Objective:   Physical Exam  Constitutional: She is oriented to person, place, and time. She appears well-developed and well-nourished.  Genitourinary: Vagina normal. There is no rash, tenderness, lesion or injury on the right labia. There is no rash, tenderness, lesion or injury on the left labia.  Neurological: She is alert and oriented to person, place, and time.  Skin: Skin is warm and dry.  Psychiatric: She has a normal mood and affect.       Assessment:     Recurrent labia minora abscesses? (with recent + e.coli culture)     Plan:     Repeat skin culture to recheck for E coli Recommendations for microfiber cloths to decrease bacteria or use new washcloth with each bath/shower, stop loofa, start antibacterial soap, no saving or new razor each time.  Pt will make changes. Pt is taking Valtrex 1 gram.  Will continue for some months further just in case this is helping.  ~15 minutes spent with patient >50% of time  was in face to face discussion of above.

## 2015-02-05 LAB — WOUND CULTURE: Gram Stain: NONE SEEN

## 2015-02-07 ENCOUNTER — Encounter: Payer: Self-pay | Admitting: Obstetrics & Gynecology

## 2015-02-09 ENCOUNTER — Telehealth: Payer: Self-pay | Admitting: Emergency Medicine

## 2015-02-09 MED ORDER — AMOXICILLIN-POT CLAVULANATE 875-125 MG PO TABS: 1.0000 | ORAL_TABLET | Freq: Two times a day (BID) | ORAL | Status: DC

## 2015-02-09 NOTE — Telephone Encounter (Signed)
Message left to return call to Carly Suarez at [phone].    

## 2015-02-09 NOTE — Telephone Encounter (Signed)
-----   Message from Jerene BearsMary S Miller, MD sent at 02/07/2015  3:45 PM EDT ----- Pt having recurrent labia minora abscesses.  Has + e coli skin culture that was treated with Cipro x 7 days.  Repeat culture is still +.  Please inform pt skin culture still + for E coli.  Pt thought at last visit that maybe her loofa sponge was possibility contributing.  I think ok to treat with Augmentin 875mg  bid x 7 days or just wait and watch.  I also think she should start using Hibiclens soap at least once a week.  It is drying so she may not be able to tolerate this much more.  It is a good antibacterial soap to help decrease skin colonization.

## 2015-02-09 NOTE — Telephone Encounter (Addendum)
Spoke with pt and message from Dr. Hyacinth MeekerMiller given. Patient given detailed instructions on how/when to use Hibiclens, spelling of medication and description of bottle given, advised can locate at pharmacy and a pharmacist can help as well if unable to find bottle. Advised patient to ensure to use clean washcloths with each application and rinse thoroughly, advised to use one time per week as can be drying to skin as per message from Dr. Hyacinth MeekerMiller. Patient asks "is this going to make it worse?" Attempted to reassure patient that this recommendation is coming from Dr. Hyacinth MeekerMiller and that although it may be drying to the area, it will help to decrease bacteria to area of abscess which will, hopefully help decrease growth of bacteria in that area.  Patient [facility]. Advised to ensure to take with food and fluids to help decrease GI upset.   Patient verbalized understanding of instructions and will follow up as needed. Advised to call back with any increase in symptoms or concerns. Routing to provider for final review. Patient agreeable to disposition. Will close encounter   1550: Patient returned call and requested to schedule a follow up appointment with Dr. Hyacinth MeekerMiller at this time. She declines any complaints, requests follow up to check area. Scheduled for 03/06/15 at 1430. Other options for dates discussed, however, this was first available appointment that worked with patient schedule.

## 2015-02-12 ENCOUNTER — Telehealth: Payer: Self-pay | Admitting: Obstetrics & Gynecology

## 2015-02-12 NOTE — Telephone Encounter (Signed)
Patient calling with "concerns about the product recommended" for her.

## 2015-02-12 NOTE — Telephone Encounter (Signed)
Message left to return call to Elias Bordner at [phone].    

## 2015-02-12 NOTE — Telephone Encounter (Signed)
Just on skin, not in vagina and inside labia.  Please give explicit instructions.

## 2015-02-12 NOTE — Telephone Encounter (Signed)
Spoke with patient. She has questions about using Hibiclens. Patient was advised with last message to use once per week on labial area and to clean with a new wash cloth each time and advised may be drying to skin.   Patient states she read bottle and it states not use product on genital area. Advised this is understandable, however, instructions were given by Dr. Hyacinth MeekerMiller to do so. Patient wants to ensure this will not cause "any further problems." Again advised patient that this is very strong antibiotic skin wash that will help decrease colonies of bacteria on her skin that may help with decreasing chance for recurrence of abscess.   Patient states that she is not having symptoms at this time and that abscesses have recurred on both R and L inner labial areas so she is not sure exactly where to use product.    Advised patient would send message to  Dr. Hyacinth MeekerMiller to obtain instruction on directly where to apply Hibiclens and would return her call with response. Patient agreeable.

## 2015-02-17 NOTE — Telephone Encounter (Signed)
Call to patient and instructions given. . Advised external labia and external pubic area only. Patient states she understands directly where and when to use the Hibiclens. Advised patient to call back with any further concerns and she is agreeable. Routing to provider for final review. Patient agreeable to disposition. Will close encounter

## 2015-03-06 ENCOUNTER — Ambulatory Visit (INDEPENDENT_AMBULATORY_CARE_PROVIDER_SITE_OTHER): Payer: BLUE CROSS/BLUE SHIELD | Admitting: Obstetrics & Gynecology

## 2015-03-06 ENCOUNTER — Encounter: Payer: Self-pay | Admitting: Obstetrics & Gynecology

## 2015-03-06 VITALS — BP 114/76 | HR 76 | Ht 63.0 in | Wt 138.0 lb

## 2015-03-06 DIAGNOSIS — N764 Abscess of vulva: Secondary | ICD-10-CM | POA: Diagnosis not present
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
## 2015-03-09 LAB — WOUND CULTURE: Organism ID, Bacteria: NORMAL

## 2015-03-13 ENCOUNTER — Encounter: Payer: Self-pay | Admitting: Obstetrics & Gynecology

## 2015-03-28 ENCOUNTER — Emergency Department
Admission: EM | Admit: 2015-03-28 | Discharge: 2015-03-28 | Disposition: A | Discharge status: Home / Self Care | Payer: BLUE CROSS/BLUE SHIELD | Attending: Emergency Medicine | Admitting: Emergency Medicine

## 2015-03-28 ENCOUNTER — Encounter: Payer: Self-pay | Admitting: Emergency Medicine

## 2015-03-28 DIAGNOSIS — Y998 Other external cause status: Secondary | ICD-10-CM | POA: Diagnosis not present

## 2015-03-28 DIAGNOSIS — Z79899 Other long term (current) drug therapy: Secondary | ICD-10-CM | POA: Diagnosis not present

## 2015-03-28 DIAGNOSIS — Y9389 Activity, other specified: Secondary | ICD-10-CM | POA: Insufficient documentation

## 2015-03-28 DIAGNOSIS — Y92410 Unspecified street and highway as the place of occurrence of the external cause: Secondary | ICD-10-CM | POA: Diagnosis not present

## 2015-03-28 DIAGNOSIS — Z792 Long term (current) use of antibiotics: Secondary | ICD-10-CM | POA: Diagnosis not present

## 2015-03-28 DIAGNOSIS — Z0289 Encounter for other administrative examinations: Secondary | ICD-10-CM | POA: Diagnosis not present
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
## 2015-03-28 NOTE — ED Notes (Signed)
NAD noted at time of D/C. Pt denies questions or concerns. Pt ambulatory to the lobby at this time.  

## 2015-03-28 NOTE — Discharge Instructions (Signed)
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
## 2015-04-03 ENCOUNTER — Ambulatory Visit (INDEPENDENT_AMBULATORY_CARE_PROVIDER_SITE_OTHER): Payer: BLUE CROSS/BLUE SHIELD | Admitting: Primary Care

## 2015-04-03 ENCOUNTER — Encounter: Payer: Self-pay | Admitting: Primary Care

## 2015-04-03 VITALS — BP 128/80 | HR 67 | Temp 97.5°F | Ht 63.0 in | Wt 140.8 lb

## 2015-04-03 DIAGNOSIS — R0981 Nasal congestion: Secondary | ICD-10-CM | POA: Diagnosis not present

## 2015-04-03 MED ORDER — FLUTICASONE PROPIONATE 50 MCG/ACT NA SUSP: 2.0000 | Freq: Every day | NASAL | Status: DC

## 2015-04-03 NOTE — Patient Instructions (Signed)
Start taking a daily Claritin or Zyrtec for allergy symptoms, at least for the next several weeks. Use Flonase daily, 2 puffs in each nostril. You may take Meclizine for dizziness, which can be purchased over the counter. Start with 1/2 tablet because this may make you drowsy. Call if you develop fevers, chills, increased pain to your throat or worsening cough by mid next week. It was nice meeting you!

## 2015-04-03 NOTE — Progress Notes (Signed)
Pre visit review using our clinic review tool, if applicable. No additional management support is needed unless otherwise documented below in the visit note. 

## 2015-04-03 NOTE — Progress Notes (Signed)
Subjective:    Patient ID: Carly Suarez, female    DOB: 02/21/1986, 29 y.o.   MRN: [id number]  HPI  Carly Suarez is a 29 year old female who presents today with a chief complaint of dizziness. She's had allergy syptoms for the past 2 weeks. Starting Wednesday this weeks she's developed sore throat, headaches, nasal congestion, one low grade fever,and dizziness. She's feeling better today and is without most of the prior symptoms, but is still feeling dizzy/off balance. She's taken tylenol and OTC cold and flu with relief of other symptoms. The dizziness is present mostly while she's at rest and during movement, and feels as though she's spinning. Denies syncope, numbness, palpitations. Does not take zyrtec or Claritin.  Review of Systems  Constitutional: Negative for fever and chills.  HENT: Positive for congestion, postnasal drip and sinus pressure. Negative for ear pain, rhinorrhea and sore throat.        Ear fullness bilaterally  Eyes: Negative for itching.  Respiratory: Positive for cough. Negative for shortness of breath.   Cardiovascular: Negative for chest pain.  Gastrointestinal: Negative for nausea and vomiting.  Neurological: Positive for dizziness and headaches. Negative for numbness.       Past Medical History  Diagnosis Date  . ADD (attention deficit disorder)   . History of chicken pox   . History of shingles     History   Social History  . Marital Status: Married    Spouse Name: N/A  . Number of Children: N/A  . Years of Education: N/A   Occupational History  . Not on file.   Social History Main Topics  . Smoking status: Never Smoker   . Smokeless tobacco: Never Used  . Alcohol Use: 0.0 oz/week    1-5 drink(s) per week  . Drug Use: No  . Sexual Activity: Yes    Birth Control/ Protection: Pill   Other Topics Concern  . Not on file   Social History Narrative   Married, no children.   Unemployed.   Diet: healthy   Exercise: 4-5 days a week    Past  Surgical History  Procedure Laterality Date  . Wisdom tooth extraction  2005  . Leep N/A 06/30/2014    Procedure: LOOP ELECTROSURGICAL EXCISION PROCEDURE (LEEP);  Surgeon: Annamaria BootsMary Suzanne Miller, MD;  Location: [facility];  Service: Gynecology;  Laterality: N/A;    Family History  Problem Relation Age of Onset  . Hypertension Mother   . Hypertension Father   . Prostate cancer Father [phone]  . Melanoma Father [phone]  . Diabetes Maternal Grandmother   . Arthritis Maternal Grandmother   . Hypertension Maternal Grandmother   . Prostate cancer Maternal Grandfather   . Heart disease Paternal Grandmother   . Other Brother [phone]    Batten Disease - gentic disorder  - attacks brain and nervous system     No Known Allergies  Current Outpatient Prescriptions on File Prior to Visit  Medication Sig Dispense Refill  . acetaminophen (TYLENOL) 500 MG tablet Take 1,000 mg by mouth every 6 (six) hours as needed for moderate pain or headache.    . lidocaine (XYLOCAINE) 5 % ointment Apply 1 application topically 3 (three) times daily. 1.25 g 0  . Norgestimate-Ethinyl Estradiol Triphasic 0.18/0.215/0.25 MG-35 MCG tablet Take 1 tablet by mouth daily. 3 Package 4  . valACYclovir (VALTREX) 1000 MG tablet Take 1 tablet (1,000 mg total) by mouth daily. Increase to bid x 3 days for outbreak (Patient not taking:  Reported on 04/03/2015) 90 tablet 4   No current facility-administered medications on file prior to visit.    BP 128/80 mmHg  Pulse 67  Temp(Src) 97.5 F (36.4 C) (Oral)  Ht 5\' 3"  (1.6 m)  Wt 140 lb 12.8 oz (63.866 kg)  BMI 24.95 kg/m2  SpO2 98%  LMP 03/18/2015    Objective:   Physical Exam  Constitutional: She is oriented to person, place, and time. She appears well-developed.  HENT:  Right Ear: Ear canal normal. Tympanic membrane is bulging. Tympanic membrane is not erythematous.  Left Ear: Tympanic membrane and ear canal normal.  Nose: Nose normal.  Mouth/Throat: Oropharynx is clear and moist.    Eyes: Conjunctivae and EOM are normal. Pupils are equal, round, and reactive to light.  Neck: Neck supple.  Cardiovascular: Normal rate and regular rhythm.   Pulmonary/Chest: Effort normal and breath sounds normal.  Lymphadenopathy:    She has no cervical adenopathy.  Neurological: She is alert and oriented to person, place, and time. She has normal reflexes. No cranial nerve deficit. Coordination normal.  Skin: Skin is warm and dry.          Assessment & Plan:  Dizziness:  Bulging to left TM without infection; otherwise exam is unremarkable. Suspect related to allergies and left ear irritation. Start claritin or zyrtec daily. RX for Flonase sent for nasal congestion. OTC Meclizine for dizziness, start with 1/2 tablet. Call if development of fevers, chills, palpitations, syncope.

## 2015-04-07 ENCOUNTER — Telehealth: Payer: Self-pay | Admitting: Obstetrics & Gynecology

## 2015-04-07 NOTE — Telephone Encounter (Signed)
Spoke with patient. Patient is calling to let Dr.Miller know her labial abscess has returned. Lump is on her right side. First noticed it on Saturday 5/14. States that it has not changed in size but is sore. Patient has history of recurring E.coli culture. Denies any redness, swelling, or warmth to the area. Patient is only available to come in tomorrow to see Dr.Miller. Advised Dr.Miller will be out of the office tomorrow but can schedule with another provider or for Thursday with Dr.Miller but patient declines. "It always comes and goes away. I was really just calling to let her know. By the time I could come in with my schedule and hers it will be gone." Patient declines to schedule appointment. Advised will need to monitor area and is she notices any changes to the area such as redness, swelling, increasing in size, etc will need to be seen in office or local urgent care for evaluation. Patient is agreeable.  Routing to provider for final review.

## 2015-04-07 NOTE — Telephone Encounter (Signed)
Thank you.  I do not have any additional recommendations.

## 2015-04-07 NOTE — Telephone Encounter (Signed)
Patient is asking to talk with Dr.Miller's nurse. No further details given. Last seen 03/06/15.

## 2015-05-15 ENCOUNTER — Other Ambulatory Visit: Payer: Self-pay | Admitting: Obstetrics & Gynecology

## 2015-05-15 MED ORDER — NORGESTIM-ETH ESTRAD TRIPHASIC 0.18/0.215/0.25 MG-35 MCG PO TABS: 1.0000 | ORAL_TABLET | Freq: Every day | ORAL | Status: DC

## 2015-05-15 NOTE — Telephone Encounter (Signed)
Patient is requesting a refill for her birth control. She is using [employer].

## 2015-05-15 NOTE — Telephone Encounter (Signed)
Medication refill request: tri sprintec Last AEX:  05-12-14, pap was 01-01-15 Next AEX: 06-08-15 Last MMG (if hormonal medication request): n/a Refill authorized: 3packs with 0 refills. Please advise

## 2015-05-15 NOTE — Telephone Encounter (Signed)
Message left letting pt know rx was sent to the pharmacy

## 2015-05-26 ENCOUNTER — Ambulatory Visit: Payer: 59 | Admitting: Obstetrics & Gynecology

## 2015-06-08 ENCOUNTER — Encounter: Payer: Self-pay | Admitting: Obstetrics & Gynecology

## 2015-06-08 ENCOUNTER — Ambulatory Visit (INDEPENDENT_AMBULATORY_CARE_PROVIDER_SITE_OTHER): Payer: Managed Care, Other (non HMO) | Admitting: Obstetrics & Gynecology

## 2015-06-08 VITALS — BP 138/80 | HR 68 | Resp 16 | Ht 63.0 in | Wt 139.0 lb

## 2015-06-08 DIAGNOSIS — Z01419 Encounter for gynecological examination (general) (routine) without abnormal findings: Secondary | ICD-10-CM | POA: Diagnosis not present

## 2015-06-08 DIAGNOSIS — N764 Abscess of vulva: Secondary | ICD-10-CM

## 2015-06-08 DIAGNOSIS — Z0000 Encounter for general adult medical examination without abnormal findings: Secondary | ICD-10-CM | POA: Diagnosis not present

## 2015-06-08 DIAGNOSIS — Z124 Encounter for screening for malignant neoplasm of cervix: Secondary | ICD-10-CM | POA: Diagnosis not present

## 2015-06-08 LAB — POCT URINALYSIS DIPSTICK
Bilirubin, UA: NEGATIVE
Blood, UA: NEGATIVE
Glucose, UA: NEGATIVE
KETONES UA: NEGATIVE
NITRITE UA: NEGATIVE
Protein, UA: NEGATIVE
Urobilinogen, UA: NEGATIVE
pH, UA: 5

## 2015-06-08 MED ORDER — VALACYCLOVIR HCL 1 G PO TABS: 1000.0000 mg | ORAL_TABLET | Freq: Every day | ORAL | Status: DC

## 2015-06-08 MED ORDER — NORGESTIM-ETH ESTRAD TRIPHASIC 0.18/0.215/0.25 MG-35 MCG PO TABS: 1.0000 | ORAL_TABLET | Freq: Every day | ORAL | Status: DC
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
## 2015-06-09 LAB — HEMOGLOBIN, FINGERSTICK: HEMOGLOBIN, FINGERSTICK: 13.9 g/dL (ref 12.0–16.0)

## 2015-06-10 LAB — IPS PAP TEST WITH HPV

## 2015-06-11 LAB — WOUND CULTURE
Gram Stain: NONE SEEN
Gram Stain: NONE SEEN
Organism ID, Bacteria: NORMAL

## 2015-06-16 ENCOUNTER — Ambulatory Visit (INDEPENDENT_AMBULATORY_CARE_PROVIDER_SITE_OTHER): Payer: Managed Care, Other (non HMO) | Admitting: Obstetrics & Gynecology

## 2015-06-16 ENCOUNTER — Telehealth: Payer: Self-pay | Admitting: Obstetrics & Gynecology

## 2015-06-16 VITALS — BP 132/84 | HR 72 | Resp 16 | Wt 137.0 lb

## 2015-06-16 DIAGNOSIS — N764 Abscess of vulva: Secondary | ICD-10-CM | POA: Diagnosis not present

## 2015-06-16 DIAGNOSIS — Z9189 Other specified personal risk factors, not elsewhere classified: Secondary | ICD-10-CM | POA: Diagnosis not present

## 2015-06-16 NOTE — Telephone Encounter (Signed)
Reviewed with Dr. Hyacinth Meeker.  Can offer 11:00 with Dr. Hyacinth Meeker.  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

## 2015-06-16 NOTE — Telephone Encounter (Signed)
Patient returned call. She will come for 1100 appointment with Dr. Hyacinth Meeker. Advised work in appointment, patient agreeable. Routing to provider for final review. Patient agreeable to disposition. Will close encounter.

## 2015-06-16 NOTE — Telephone Encounter (Signed)
Spoke with patient. She states that over the last two days she is experiencing the same symptoms of vulvar abscess that she has experienced in the past. She states that today, the abscess is more larger, painful and "angry" than usual. Denies fevers or chills. Expresses to this RN that she is "ready to have it cut out" and "I want to try again." She is at work at this time. Denies fevers. Advised would discuss her symptoms with Dr. Hyacinth Meeker and would return call with appointment. She is agreeable.

## 2015-06-16 NOTE — Telephone Encounter (Signed)
Patient wants to talk with Tresa Endo. She is not returning a call to [place]. She states it is an on going issue and she needs to be seen today. No information given.  She states it is urgent.

## 2015-06-17 LAB — ABO AND RH: RH TYPE: POSITIVE

## 2015-06-28 ENCOUNTER — Encounter: Payer: Self-pay | Admitting: Obstetrics & Gynecology

## 2015-06-28 NOTE — Progress Notes (Signed)
Subjective:     Patient ID: Carly Suarez, female   DOB: 03-04-1986, 29 y.o.   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  Pt has not had an issue with this for the past three or four months.  Pt began having symptoms  late yesterday.  She started having pain with increased tenderness to palpation.  As well, area feels enlarged.  Pt states "it's huge".  No fevers.  No drainage.  Pt comes in today stating she wants this excised but upon discussing this, she realizes that this won't really do anything as the little abscessed areas are always in different locations.  I have cultured this which has been + for E coli.  Earlier this year, I treated her with Ciprofloxin.  Culture was repeated and still positive so switched to bactrim DS.  Repeat culture was then negative.  She and I wondered whether this was due to her Loofa sponge so she is using a new washcloth every day.    She continues to be frustrated by this and I am really at a loss for what else to recommend to pt.  We discussed referral to [facility] for second opinion.  Pt likes this idea.  Husband with pt today, as he almost always is.   Separate question--pt needs to know blood type for work.  Asks if I can do this today.     Review of Systems  All other systems reviewed and are negative.      Objective:   Physical Exam  Constitutional: She appears well-developed and well-nourished.  Genitourinary: Vagina normal.    There is no rash, tenderness or lesion on the right labia. There is no rash, tenderness or lesion on the left labia.  Lymphadenopathy:       Right: No inguinal adenopathy present.       Left: No inguinal adenopathy present.  Skin: Skin is warm and dry.  Psychiatric: She has a normal mood and affect.       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

## 2015-06-30 ENCOUNTER — Telehealth: Payer: Self-pay | Admitting: Emergency Medicine

## 2015-06-30 DIAGNOSIS — N764 Abscess of vulva: Secondary | ICD-10-CM

## 2015-06-30 NOTE — Telephone Encounter (Signed)
-----   Message from Jerene Bears, MD sent at 06/30/2015  5:14 AM EDT ----- Regarding: RE: referral I think we should do the vulvar pain clinic at [facility].  Once we have appt, I want pt to take a picture of the next time she has one of these areas (if she feels comfortable doing that) so they will have a picture of what is occuring.  Thanks.  MSM ----- Message -----    From: Joeseph Amor, RN    Sent: 06/29/2015   1:55 PM      To: Jerene Bears, MD Subject: RE: referral                                   Dr. Hyacinth Meeker,  There is the vulvar pain clinic in [facility] at [facility], that is who I was advised to call from [facility], but they have a 4 month wait.   There is a Soil scientist, Starleen Blue who specializes in Vulvar disorders. At [facility] they did not give me any further gyn recommendations.   For [facility], I was given the name of Dr. Christianne Borrow, who works with patients with Vulvar concerns.   ----- Message -----    From: Jerene Bears, MD    Sent: 06/28/2015  10:15 PM      To: Joeseph Amor, RN Subject: referral                                       French Ana, I need to refer this pt to [facility]--somewhere for a second opinion for this lady.  She has recurrent labia minor abscesses.  Can you call [facility] and [facility] to see if there is anyone on faculty who specializes in vulvar disorders so I can refer her?  Thanks.  Leda Quail

## 2015-06-30 NOTE — Telephone Encounter (Signed)
Referral faxed to [facility] invasive gyn clinic (formerly pelvic pain and vulvar pain clinic with fax confirmation received). They will process referral and contact patient to schedule.     Message left to return call to [place] at [phone] to give message from Dr. Hyacinth Meeker.

## 2015-06-30 NOTE — Telephone Encounter (Signed)
Patient returned call. She is advise of message from Dr. Hyacinth Meeker and agreeable.  Advised referral is pending and will be contacted to schedule by their office. Patient agreeable.

## 2015-07-09 NOTE — Telephone Encounter (Signed)
[facility] clinic. Patient is in referral que and will be contacted by [facility] to schedule. Probably on Monday 07/13/15 per reception. Will follow up.

## 2015-07-13 NOTE — Telephone Encounter (Signed)
[facility] clinic. Spoke with Eber Jones. She states they have attempted to contact patient and she has not returned call. They will try to call her again now and will send a letter.

## 2015-07-17 NOTE — Telephone Encounter (Signed)
[facility] for update. Patient was seen for office visit 07/16/15.  Office visit available in Care everywhere.  Routing to Dr. Hyacinth Meeker and will close encounter.

## 2015-10-28 ENCOUNTER — Ambulatory Visit (INDEPENDENT_AMBULATORY_CARE_PROVIDER_SITE_OTHER): Payer: Managed Care, Other (non HMO) | Admitting: Family Medicine

## 2015-10-28 ENCOUNTER — Encounter: Payer: Self-pay | Admitting: Family Medicine

## 2015-10-28 VITALS — BP 134/80 | HR 68 | Temp 98.2°F | Ht 63.0 in | Wt 144.8 lb

## 2015-10-28 DIAGNOSIS — R1013 Epigastric pain: Secondary | ICD-10-CM

## 2015-10-28 LAB — CBC WITH DIFFERENTIAL/PLATELET
BASOS ABS: 0 10*3/uL (ref 0.0–0.1)
Basophils Relative: 0.5 % (ref 0.0–3.0)
EOS ABS: 0.3 10*3/uL (ref 0.0–0.7)
Eosinophils Relative: 3.9 % (ref 0.0–5.0)
HEMATOCRIT: 41.5 % (ref 36.0–46.0)
HEMOGLOBIN: 14 g/dL (ref 12.0–15.0)
LYMPHS PCT: 30 % (ref 12.0–46.0)
Lymphs Abs: 2 10*3/uL (ref 0.7–4.0)
MCHC: 33.7 g/dL (ref 30.0–36.0)
MCV: 94.9 fl (ref 78.0–100.0)
MONOS PCT: 6.9 % (ref 3.0–12.0)
Monocytes Absolute: 0.5 10*3/uL (ref 0.1–1.0)
Neutro Abs: 3.9 10*3/uL (ref 1.4–7.7)
Neutrophils Relative %: 58.7 % (ref 43.0–77.0)
Platelets: 184 10*3/uL (ref 150.0–400.0)
RBC: 4.38 Mil/uL (ref 3.87–5.11)
RDW: 12.6 % (ref 11.5–15.5)
WBC: 6.7 10*3/uL (ref 4.0–10.5)

## 2015-10-28 LAB — COMPREHENSIVE METABOLIC PANEL
ALT: 14 U/L (ref 0–35)
AST: 18 U/L (ref 0–37)
Albumin: 4 g/dL (ref 3.5–5.2)
Alkaline Phosphatase: 45 U/L (ref 39–117)
BILIRUBIN TOTAL: 0.4 mg/dL (ref 0.2–1.2)
BUN: 8 mg/dL (ref 6–23)
CALCIUM: 9.4 mg/dL (ref 8.4–10.5)
CO2: 27 mEq/L (ref 19–32)
CREATININE: 0.66 mg/dL (ref 0.40–1.20)
Chloride: 104 mEq/L (ref 96–112)
GFR: 112.01 mL/min (ref >60.00)
Glucose, Bld: 88 mg/dL (ref 70–99)
Potassium: 4 mEq/L (ref 3.5–5.1)
Sodium: 138 mEq/L (ref 135–145)
TOTAL PROTEIN: 6.9 g/dL (ref 6.0–8.3)

## 2015-10-28 LAB — LIPASE: Lipase: 16 U/L (ref 11.0–59.0)

## 2015-10-28 NOTE — Patient Instructions (Addendum)
Stop at lab on way out. Start prilosec 2 tabs 20 mg daily x 2 weeks. If improving continue 4-6 weeks then wean off  Avoid trigger.. Stop alcohol, ibuprofen  instead tylenol). Avoid tomato, spicy food, chocolate, peppermint, soda, citris. Make sure eating smaller meals. Call if severe pain in the few days.

## 2015-10-28 NOTE — Progress Notes (Signed)
   Subjective:    Patient ID: Carly Suarez, female    DOB: 04/20/1986, 29 y.o.   MRN: [id number]  HPI   29 year old female pt presents with new onset pain in epigastrium starting 2 days ago at 5 PM after dinner. Described as burning constant pain. Gradually increased in intensity, spread up to chest. Pain with breathing.  Tried pepto did not help.  Could not sleep through the night. Standing and walking made pain improve some. Fell asleep finally sitting up.  Pain improved significantly by the next morning.  Last night after dinner pain flared up again.. Burning heavy pain.   She felt N, no V, no D/C. No blood in stool. No sour taste in throat. No change in diet, drinks occ soda,  Drinks ETOH wine 0-5 a week.  Uses ibuprofen after work 3-4 times a week.  She has never had similar issues in past. Social History /Family History/Past Medical History reviewed and updated if needed.  Review of Systems  Constitutional: Negative for fever and fatigue.  HENT: Negative for ear pain.   Eyes: Negative for pain.  Respiratory: Negative for chest tightness and shortness of breath.   Cardiovascular: Negative for chest pain, palpitations and leg swelling.  Gastrointestinal: Positive for abdominal pain. Negative for blood in stool, abdominal distention and anal bleeding.  Genitourinary: Negative for dysuria.       Objective:   Physical Exam  Constitutional: Vital signs are normal. She appears well-developed and well-nourished. She is cooperative.  Non-toxic appearance. She does not appear ill. No distress.  HENT:  Head: Normocephalic.  Right Ear: Hearing, tympanic membrane, external ear and ear canal normal. Tympanic membrane is not erythematous, not retracted and not bulging.  Left Ear: Hearing, tympanic membrane, external ear and ear canal normal. Tympanic membrane is not erythematous, not retracted and not bulging.  Nose: No mucosal edema or rhinorrhea. Right sinus exhibits no maxillary  sinus tenderness and no frontal sinus tenderness. Left sinus exhibits no maxillary sinus tenderness and no frontal sinus tenderness.  Mouth/Throat: Uvula is midline, oropharynx is clear and moist and mucous membranes are normal.  Eyes: Conjunctivae, EOM and lids are normal. Pupils are equal, round, and reactive to light. Lids are everted and swept, no foreign bodies found.  Neck: Trachea normal and normal range of motion. Neck supple. Carotid bruit is not present. No thyroid mass and no thyromegaly present.  Cardiovascular: Normal rate, regular rhythm, S1 normal, S2 normal, normal heart sounds, intact distal pulses and normal pulses.  Exam reveals no gallop and no friction rub.   No murmur heard. Pulmonary/Chest: Effort normal and breath sounds normal. No tachypnea. No respiratory distress. She has no decreased breath sounds. She has no wheezes. She has no rhonchi. She has no rales. Chest wall is not dull to percussion. She exhibits no mass and no tenderness.  Abdominal: Soft. Normal appearance and bowel sounds are normal. There is no hepatosplenomegaly. There is tenderness in the epigastric area. There is no rigidity, no rebound, no guarding and no CVA tenderness.  Neurological: She is alert.  Skin: Skin is warm, dry and intact. No rash noted.  Psychiatric: Her speech is normal and behavior is normal. Judgment and thought content normal. Her mood appears not anxious. Cognition and memory are normal. She does not exhibit a depressed mood.          Assessment & Plan:

## 2015-10-28 NOTE — Progress Notes (Signed)
Pre visit review using our clinic review tool, if applicable. No additional management support is needed unless otherwise documented below in the visit note. 

## 2015-10-28 NOTE — Assessment & Plan Note (Signed)
Gastritis vs pancreatitis vs PUD along with GERD. Less likely gallbladder disease.  Eval with CMET, CBC and lipase.  treat with PPI prescription strength.  Avoid triggers/reviewed  Stop ibuprofen and ETOH.

## 2015-10-29 ENCOUNTER — Telehealth: Payer: Self-pay | Admitting: Family Medicine

## 2015-10-29 NOTE — Telephone Encounter (Signed)
Sh can come by tomorrow to poick up set of three stool cards to test stool for blood. Hemeoccult not IFOB please.

## 2015-10-29 NOTE — Telephone Encounter (Signed)
Pt called to give Dr. Ermalene SearingBedsole an update on how she is doing. She states that she did not think of it before but for about 3 days now she has been having really dark stool when she has a bowel movement. She thinks it has blood in it. She has started the prilosec as directed. You can reach her at [phone].

## 2015-10-29 NOTE — Telephone Encounter (Signed)
Left voicemail for patient to return phone call 

## 2015-10-30 NOTE — Telephone Encounter (Signed)
Carly Suarez notified as instructed by telephone.  She doesn't feel the hemoccult cards are necessary.  She is feeling some better since starting the Prilosec.  Per Dr. Ermalene SearingBedsole, continue with Prilosec.  If dark stools continue then she would recommend her doing the hemoccult cards.  Patient states understanding.

## 2016-04-05 ENCOUNTER — Telehealth: Payer: Self-pay | Admitting: Family Medicine

## 2016-04-05 DIAGNOSIS — Z13220 Encounter for screening for lipoid disorders: Secondary | ICD-10-CM

## 2016-04-05 NOTE — Telephone Encounter (Signed)
-----   Message from Baldomero LamyNatasha C Chavers sent at 04/04/2016  1:50 PM EDT ----- Regarding: [facility] Labs Wed 5/17, need orders. Thanks! :-) Please order  future cpx labs for pt's upcoming lab appt. Thanks Rodney Boozeasha

## 2016-04-05 NOTE — Telephone Encounter (Signed)
Offer STD testing

## 2016-04-06 ENCOUNTER — Other Ambulatory Visit (INDEPENDENT_AMBULATORY_CARE_PROVIDER_SITE_OTHER): Payer: Managed Care, Other (non HMO)

## 2016-04-06 DIAGNOSIS — Z13220 Encounter for screening for lipoid disorders: Secondary | ICD-10-CM

## 2016-04-06 LAB — LIPID PANEL
CHOL/HDL RATIO: 3
Cholesterol: 199 mg/dL (ref 0–200)
HDL: 63.2 mg/dL (ref >39.00)
LDL CALC: 117 mg/dL — AB (ref 0–99)
NonHDL: 135.37
TRIGLYCERIDES: 91 mg/dL (ref 0.0–149.0)
VLDL: 18.2 mg/dL (ref 0.0–40.0)

## 2016-04-06 LAB — COMPREHENSIVE METABOLIC PANEL
ALT: 10 U/L (ref 0–35)
AST: 14 U/L (ref 0–37)
Albumin: 4 g/dL (ref 3.5–5.2)
Alkaline Phosphatase: 50 U/L (ref 39–117)
BUN: 12 mg/dL (ref 6–23)
CALCIUM: 9.4 mg/dL (ref 8.4–10.5)
CHLORIDE: 104 meq/L (ref 96–112)
CO2: 28 meq/L (ref 19–32)
Creatinine, Ser: 0.64 mg/dL (ref 0.40–1.20)
GFR: 115.71 mL/min (ref >60.00)
Glucose, Bld: 87 mg/dL (ref 70–99)
Potassium: 3.5 mEq/L (ref 3.5–5.1)
Sodium: 137 mEq/L (ref 135–145)
Total Bilirubin: 0.3 mg/dL (ref 0.2–1.2)
Total Protein: 6.7 g/dL (ref 6.0–8.3)

## 2016-04-15 ENCOUNTER — Ambulatory Visit (INDEPENDENT_AMBULATORY_CARE_PROVIDER_SITE_OTHER): Payer: Managed Care, Other (non HMO) | Admitting: Family Medicine

## 2016-04-15 ENCOUNTER — Telehealth: Payer: Self-pay

## 2016-04-15 ENCOUNTER — Encounter: Payer: Self-pay | Admitting: Family Medicine

## 2016-04-15 VITALS — BP 124/62 | HR 75 | Temp 97.8°F | Ht 63.0 in | Wt 145.2 lb

## 2016-04-15 DIAGNOSIS — R21 Rash and other nonspecific skin eruption: Secondary | ICD-10-CM | POA: Diagnosis not present

## 2016-04-15 DIAGNOSIS — Z0000 Encounter for general adult medical examination without abnormal findings: Secondary | ICD-10-CM | POA: Diagnosis not present

## 2016-04-15 DIAGNOSIS — M25562 Pain in left knee: Secondary | ICD-10-CM

## 2016-04-15 DIAGNOSIS — Z23 Encounter for immunization: Secondary | ICD-10-CM | POA: Diagnosis not present

## 2016-04-15 MED ORDER — TRIAMCINOLONE ACETONIDE 0.5 % EX CREA: 1.0000 "application " | TOPICAL_CREAM | Freq: Two times a day (BID) | CUTANEOUS | Status: DC

## 2016-04-15 NOTE — Assessment & Plan Note (Signed)
Likely patellofemoral versus, patellar ligament strain.  Home PT, glucosamine. Info given.

## 2016-04-15 NOTE — Addendum Note (Signed)
Addended by: Desmond DikeKNIGHT, Moua Rasmusson H on: 04/15/2016 10:21 AM   Modules accepted: Orders

## 2016-04-15 NOTE — Telephone Encounter (Signed)
Pt was seen earlier today and pt is concerned about areas on ankles and the healing process; pt is supposed to have tattoo placed on pts upper arm 04/20/16 and wants to know if safe to get tatoo and does Dr Ermalene SearingBedsole think tatoo will heal properly.pt request cb.

## 2016-04-15 NOTE — Telephone Encounter (Signed)
Probably would be fine but to be safe I would wait on tattoo, until skin lesions resolved.

## 2016-04-15 NOTE — Progress Notes (Signed)
Pre visit review using our clinic review tool, if applicable. No additional management support is needed unless otherwise documented below in the visit note. 

## 2016-04-15 NOTE — Assessment & Plan Note (Signed)
Resolving hyperpigmentation from past  Area of inflammation from bite.  Treat with topical steroid.

## 2016-04-15 NOTE — Telephone Encounter (Signed)
Pattricia Bossnnie notified as instructed by telephone.

## 2016-04-15 NOTE — Patient Instructions (Signed)
Start home [physical therapy for knee pain. Can use glucosamine 500 mg 1-3 ties daily for knee pain and inflammation.   Apply steroid cream to ares on legs, but expect several months for resolution.

## 2016-04-15 NOTE — Progress Notes (Signed)
Subjective:    Patient ID: Carly Suarez, female    DOB: 1986/06/15, 30 y.o.   MRN: [id number]  HPI   30 year old female presents for annual visit, but does see GYN for pelvic/breast exam. 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 On valacyclovir prn.  She has the following acute issues.  She has been having some issues with her left knee x 3-4 months.  She works as Emergency planning/management officer, on Health visitor a lot. She does repetitive get in and out of car. Pain anterior below knee cap. Pain worse with going up stairs, squats, running.  no known injury/ fall.  Occ popping, grinding. Has gotten better with getting new car that is higher off ground No swelling, no redness.  No history of knee issues.      She has also noted sores around ankles x 4 months. Started out itchy, now slightly sore. Thought they started as chigger bites or mosquito bites.  Derm gave her cream, did not help.  gradulally improving overtime.  Labs reviewed in detail. LDL at goal < 160.  Lab Results  Component Value Date   CHOL 199 04/06/2016   HDL 63.20 04/06/2016   LDLCALC 117* 04/06/2016   TRIG 91.0 04/06/2016   CHOLHDL 3 04/06/2016   Exercise:  regularly Diet:eats out a lot.  Seeing a psychiatrist for depression,major  started on Tintellix but was to expression.Has follow up on May 31.  Body mass index is 25.74 kg/(m^2).   Social History /Family History/Past Medical History reviewed and updated if needed.   Review of Systems  Constitutional: Negative for fever and fatigue.  HENT: Negative for congestion and ear pain.   Eyes: Negative for pain.  Respiratory: Negative for cough, chest tightness and shortness of breath.   Cardiovascular: Negative for chest pain, palpitations and leg swelling.  Gastrointestinal: Negative for abdominal pain.  Genitourinary: Negative for dysuria and vaginal bleeding.  Musculoskeletal: Positive for back pain.  Neurological: Negative for  syncope, light-headedness and headaches.  Psychiatric/Behavioral: Negative for dysphoric mood.       Objective:   Physical Exam  Constitutional: Vital signs are normal. She appears well-developed and well-nourished. She is cooperative.  Non-toxic appearance. She does not appear ill. No distress.  HENT:  Head: Normocephalic.  Right Ear: Hearing, tympanic membrane, external ear and ear canal normal.  Left Ear: Hearing, tympanic membrane, external ear and ear canal normal.  Nose: Nose normal.  Eyes: Conjunctivae, EOM and lids are normal. Pupils are equal, round, and reactive to light. Lids are everted and swept, no foreign bodies found.  Neck: Trachea normal and normal range of motion. Neck supple. Carotid bruit is not present. No thyroid mass and no thyromegaly present.  Cardiovascular: Normal rate, regular rhythm, S1 normal, S2 normal, normal heart sounds and intact distal pulses.  Exam reveals no gallop.   No murmur heard. Pulmonary/Chest: Effort normal and breath sounds normal. No respiratory distress. She has no wheezes. She has no rhonchi. She has no rales.  Abdominal: Soft. Normal appearance and bowel sounds are normal. She exhibits no distension, no fluid wave, no abdominal bruit and no mass. There is no hepatosplenomegaly. There is no tenderness. There is no rebound, no guarding and no CVA tenderness. No hernia.  Musculoskeletal:       Right knee: Normal.       Left knee: Normal. She exhibits normal range of motion and no swelling. No tenderness found.  No medial joint line, no lateral joint line, no MCL and no patellar tendon tenderness noted.  Points to patellar tendon as area of pain, none now.  Lymphadenopathy:    She has no cervical adenopathy.    She has no axillary adenopathy.  Neurological: She is alert. She has normal strength. No cranial nerve deficit or sensory deficit.  Skin: Skin is warm, dry and intact. Rash noted. No purpura noted. Rash is not pustular.  3  hyperpigmented lesion on right lower leg, 2 on left, largest 1 cm diameter. Blanching.  Psychiatric: Her speech is normal and behavior is normal. Judgment normal. Her mood appears not anxious. Cognition and memory are normal. She does not exhibit a depressed mood.          Assessment & Plan:  The patient's preventative maintenance and recommended screening tests for an annual wellness exam were reviewed in full today. Brought up to date unless services declined.  Counselled on the importance of diet, exercise, and its role in overall health and mortality. The patient's FH and SH was reviewed, including their home life, tobacco status, and drug and alcohol status.   Vaccines: Tdap?, has received HPV x 3 PAP/Breast: per GYN Colon: no early family history.  nonsmoker  STD testing:

## 2016-05-19 ENCOUNTER — Ambulatory Visit: Payer: Managed Care, Other (non HMO) | Admitting: Family Medicine

## 2016-05-19 ENCOUNTER — Telehealth: Payer: Self-pay | Admitting: Family Medicine

## 2016-05-19 DIAGNOSIS — Z0289 Encounter for other administrative examinations: Secondary | ICD-10-CM

## 2016-05-19 NOTE — Telephone Encounter (Signed)
Patient did not come for their scheduled appointment today for follow up Please let me know if the patient needs to be contacted immediately for follow up or if no follow up is necessary.   ° °

## 2016-05-20 NOTE — Telephone Encounter (Signed)
No need to reschedule.

## 2016-06-13 ENCOUNTER — Other Ambulatory Visit: Payer: Self-pay | Admitting: Obstetrics & Gynecology

## 2016-06-14 NOTE — Telephone Encounter (Signed)
Medication refill request: OCP Last AEX:  06-08-15  Next AEX: 09-06-16  Last MMG (if hormonal medication request): N/A Refill authorized: please advise

## 2016-06-14 NOTE — Telephone Encounter (Signed)
Patient called to check on the status of the refill request. Also moved appointment up closer to due date on 06/21/16.

## 2016-06-15 NOTE — Telephone Encounter (Signed)
RF completed for #3/1RF.  Thanks.

## 2016-06-15 NOTE — Telephone Encounter (Signed)
Call to patient to advise that refill has been sent.  She is agreeable but has missed starting her pack, she was supposed to start on Sunday.  Wants to know what to do about missed pills.  Patient states she is not sexually active at all at this time. No risk for pregnancy. Encouraged patient multiple times to ensure to use a back up method if she is sexually active. She states she takes her pills to avoid ovarian cysts.  Reviewed with Dr. Oscar La, patient can start today with day one of new pack and expect irregular cycle and to use back up method as she does have potential for ovulation.   Return call to patient. She is given instructions but she states she plans to start Wednesday pill today and not take first 3 pills of pack. Advised again of instructions from Dr. Oscar La to start with first day of pack.  She is advised to call back with any heavy bleeding or if does not get cycle next month to take pregnancy test.   Patient verbalized understanding of instructions.  Will follow up prn.

## 2016-06-20 NOTE — Progress Notes (Deleted)
30 y.o. G0P0 MarriedCaucasianF here for annual exam.    No LMP recorded.          Sexually active: {yes no:314532}  The current method of family planning is {contraception:315051}.    Exercising: {yes no:314532}  {types:19826} Smoker:  {YES [id number]  Health Maintenance: Pap:  06/08/15 neg. HR HPV:neg History of abnormal Pap:  Yes, CIN III LEEP 2015 MMG:  Never TDaP:  04/15/16  Screening Labs: ***, Hb today: ***, Urine today: ***   reports that she has never smoked. She has never used smokeless tobacco. She reports that she drinks alcohol. She reports that she does not use drugs.  Past Medical History:  Diagnosis Date  . ADD (attention deficit disorder)   . History of chicken pox   . History of shingles     Past Surgical History:  Procedure Laterality Date  . LEEP N/A 06/30/2014   Procedure: LOOP ELECTROSURGICAL EXCISION PROCEDURE (LEEP);  Surgeon: Annamaria Boots, MD;  Location: [facility];  Service: Gynecology;  Laterality: N/A;  . WISDOM TOOTH EXTRACTION  2005    Current Outpatient Prescriptions  Medication Sig Dispense Refill  . acetaminophen (TYLENOL) 500 MG tablet Take 1,000 mg by mouth every 6 (six) hours as needed for moderate pain or headache.    . fluticasone (FLONASE) 50 MCG/ACT nasal spray Place 2 sprays into both nostrils daily. 16 g 0  . TRI-SPRINTEC 0.18/0.215/0.25 MG-35 MCG tablet TAKE 1 TABLET BY MOUTH DAILY 3 Package 1  . triamcinolone cream (KENALOG) 0.5 % Apply 1 application topically 2 (two) times daily. 30 g 0  . valACYclovir (VALTREX) 1000 MG tablet Take 1 tablet (1,000 mg total) by mouth daily. Increase to bid x 3 days for outbreak 90 tablet 4   No current facility-administered medications for this visit.     Family History  Problem Relation Age of Onset  . Hypertension Mother   . Hypertension Father   . Prostate cancer Father 89  . Melanoma Father 2  . Diabetes Maternal Grandmother   . Arthritis Maternal Grandmother   . Hypertension Maternal  Grandmother   . Prostate cancer Maternal Grandfather   . Heart disease Paternal Grandmother   . Other Brother 21    Batten Disease - gentic disorder  - attacks brain and nervous system     ROS:  Pertinent items are noted in HPI.  Otherwise, a comprehensive ROS was negative.  Exam:   There were no vitals taken for this visit.  Weight change: @WEIGHTCHANGE @ Height:      Ht Readings from Last 3 Encounters:  04/15/16 5\' 3"  (1.6 m)  10/28/15 5\' 3"  (1.6 m)  06/08/15 5\' 3"  (1.6 m)    General appearance: alert, cooperative and appears stated age Head: Normocephalic, without obvious abnormality, atraumatic Neck: no adenopathy, supple, symmetrical, trachea midline and thyroid {EXAM; THYROID:18604} Lungs: clear to auscultation bilaterally Breasts: {Exam; breast:13139::"normal appearance, no masses or tenderness"} Heart: regular rate and rhythm Abdomen: soft, non-tender; bowel sounds normal; no masses,  no organomegaly Extremities: extremities normal, atraumatic, no cyanosis or edema Skin: Skin color, texture, turgor normal. No rashes or lesions Lymph nodes: Cervical, supraclavicular, and axillary nodes normal. No abnormal inguinal nodes palpated Neurologic: Grossly normal   Pelvic: External genitalia:  no lesions              Urethra:  normal appearing urethra with no masses, tenderness or lesions              Bartholins and Skenes: normal  Vagina: normal appearing vagina with normal color and discharge, no lesions              Cervix: {exam; cervix:14595}              Pap taken: {yes no:314532} Bimanual Exam:  Uterus:  {exam; uterus:12215}              Adnexa: {exam; adnexa:12223}               Rectovaginal: Confirms               Anus:  normal sphincter tone, no lesions  Chaperone was present for exam.  A:  Well Woman with normal exam  P:   {plan; gyn:5269::"mammogram","pap smear","return annually or prn"}

## 2016-06-21 ENCOUNTER — Encounter: Payer: Self-pay | Admitting: Obstetrics & Gynecology

## 2016-06-21 ENCOUNTER — Ambulatory Visit: Payer: Managed Care, Other (non HMO) | Admitting: Obstetrics & Gynecology

## 2016-07-08 ENCOUNTER — Ambulatory Visit (INDEPENDENT_AMBULATORY_CARE_PROVIDER_SITE_OTHER): Payer: Managed Care, Other (non HMO) | Admitting: Obstetrics & Gynecology

## 2016-07-08 ENCOUNTER — Encounter: Payer: Self-pay | Admitting: Obstetrics & Gynecology

## 2016-07-08 VITALS — BP 124/82 | HR 70 | Resp 14 | Ht 63.0 in | Wt 143.6 lb

## 2016-07-08 DIAGNOSIS — Z01419 Encounter for gynecological examination (general) (routine) without abnormal findings: Secondary | ICD-10-CM

## 2016-07-08 DIAGNOSIS — D069 Carcinoma in situ of cervix, unspecified: Secondary | ICD-10-CM

## 2016-07-08 DIAGNOSIS — Z0000 Encounter for general adult medical examination without abnormal findings: Secondary | ICD-10-CM | POA: Diagnosis not present

## 2016-07-08 DIAGNOSIS — Z124 Encounter for screening for malignant neoplasm of cervix: Secondary | ICD-10-CM | POA: Diagnosis not present

## 2016-07-08 LAB — POCT URINALYSIS DIPSTICK
Bilirubin, UA: NEGATIVE
GLUCOSE UA: NEGATIVE
KETONES UA: NEGATIVE
Leukocytes, UA: NEGATIVE
NITRITE UA: NEGATIVE
PH UA: 7
Protein, UA: NEGATIVE
RBC UA: NEGATIVE
Urobilinogen, UA: NEGATIVE

## 2016-07-08 MED ORDER — NORGESTIM-ETH ESTRAD TRIPHASIC 0.18/0.215/0.25 MG-35 MCG PO TABS: 1.0000 | ORAL_TABLET | Freq: Every day | ORAL | 4 refills | Status: DC

## 2016-07-08 MED ORDER — VALACYCLOVIR HCL 1 G PO TABS: 1000.0000 mg | ORAL_TABLET | Freq: Every day | ORAL | 4 refills | Status: DC

## 2016-07-08 NOTE — Progress Notes (Signed)
30 y.o. G0P0 MarriedCaucasianF here for annual exam.  Doing well.  Still having issues with labia minor abscesses.  [facility].  She hasn't had any worsening of "issues" and consultation did not really help.  Cycles are regular.    Patient's last menstrual period was 07/05/2016 (exact date).          Sexually active: Yes.    The current method of family planning is OCP (estrogen/progesterone).    Exercising: Yes.    running Smoker:  no  Health Maintenance: Pap:  7/15 neg with neg HR HPV History of abnormal Pap:  yes MMG:  never Colonoscopy:  never BMD:   never TDaP:  04/15/2016  Pneumonia vaccine(s):  never Zostavax:   Has had shingles, but no vaccine Hep C testing: not indicated Screening Labs: PCP, Hb today: PCP, Urine today: normal    reports that she has never smoked. She has never used smokeless tobacco. She reports that she drinks alcohol. She reports that she does not use drugs.  Past Medical History:  Diagnosis Date  . ADD (attention deficit disorder)   . History of chicken pox   . History of shingles     Past Surgical History:  Procedure Laterality Date  . LEEP N/A 06/30/2014   Procedure: LOOP ELECTROSURGICAL EXCISION PROCEDURE (LEEP);  Surgeon: Annamaria BootsMary Suzanne Libertie Hausler, MD;  Location: [facility];  Service: Gynecology;  Laterality: N/A;  . WISDOM TOOTH EXTRACTION  2005    Current Outpatient Prescriptions  Medication Sig Dispense Refill  . acetaminophen (TYLENOL) 500 MG tablet Take 1,000 mg by mouth every 6 (six) hours as needed for moderate pain or headache.    . sertraline (ZOLOFT) 50 MG tablet     . TRI-SPRINTEC 0.18/0.215/0.25 MG-35 MCG tablet TAKE 1 TABLET BY MOUTH DAILY 3 Package 1  . triamcinolone cream (KENALOG) 0.5 % Apply 1 application topically 2 (two) times daily. 30 g 0  . valACYclovir (VALTREX) 1000 MG tablet Take 1 tablet (1,000 mg total) by mouth daily. Increase to bid x 3 days for outbreak 90 tablet 4  . fluticasone  (FLONASE) 50 MCG/ACT nasal spray Place 2 sprays into both nostrils daily. (Patient not taking: Reported on 07/08/2016) 16 g 0   No current facility-administered medications for this visit.     Family History  Problem Relation Age of Onset  . Hypertension Mother   . Hypertension Father   . Prostate cancer Father [phone]  . Melanoma Father [phone]  . Diabetes Maternal Grandmother   . Arthritis Maternal Grandmother   . Hypertension Maternal Grandmother   . Prostate cancer Maternal Grandfather   . Heart disease Paternal Grandmother   . Other Brother [phone]    Batten Disease - gentic disorder  - attacks brain and nervous system     ROS:  Pertinent items are noted in HPI.  Otherwise, a comprehensive ROS was negative.  Exam:   BP 124/82 (BP Location: Right Arm, Patient Position: Sitting)   Pulse 70   Resp 14   Ht 5\' 3"  (1.6 m)   Wt 143 lb 9.6 oz (65.1 kg)   LMP 07/05/2016 (Exact Date)   BMI 25.44 kg/m   Weight change: -4#  Height: 5\' 3"  (160 cm)  Ht Readings from Last 3 Encounters:  07/08/16 5\' 3"  (1.6 m)  04/15/16 5\' 3"  (1.6 m)  10/28/15 5\' 3"  (1.6 m)    General appearance: alert, cooperative and appears stated age Head: Normocephalic, without obvious abnormality, atraumatic  Neck: no adenopathy, supple, symmetrical, trachea midline and thyroid normal to inspection and palpation Lungs: clear to auscultation bilaterally Breasts: normal appearance, no masses or tenderness Heart: regular rate and rhythm Abdomen: soft, non-tender; bowel sounds normal; no masses,  no organomegaly Extremities: extremities normal, atraumatic, no cyanosis or edema Skin: Skin color, texture, turgor normal. No rashes or lesions Lymph nodes: Cervical, supraclavicular, and axillary nodes normal. No abnormal inguinal nodes palpated Neurologic: Grossly normal  Pelvic: External genitalia:  no lesions              Urethra:  normal appearing urethra with no masses, tenderness or lesions              Bartholins and  Skenes: normal                 Vagina: normal appearing vagina with normal color and discharge, no lesions              Cervix: no lesions              Pap taken: Yes.   Bimanual Exam:  Uterus:  normal size, contour, position, consistency, mobility, non-tender              Adnexa: normal adnexa and no mass, fullness, tenderness               Rectovaginal: Confirms               Anus:  normal sphincter tone, no lesions  Chaperone was present for exam.  A:  Well Woman with normal exam Recurrent labia minora abscesses, culture + for E. Coli with some antibiotic resistance.  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  #90/4RF RF for OCPs.  #3 month supply/4RF No blood work today return annually or prn

## 2016-07-13 LAB — IPS PAP TEST WITH HPV

## 2016-07-13 NOTE — Progress Notes (Signed)
Left message on cell phone (per DPR) and advised results and to please call the office to schedule repeat pap smear.

## 2016-07-14 ENCOUNTER — Telehealth: Payer: Self-pay | Admitting: Obstetrics & Gynecology

## 2016-07-22 ENCOUNTER — Encounter: Payer: Self-pay | Admitting: Obstetrics & Gynecology

## 2016-07-22 ENCOUNTER — Ambulatory Visit (INDEPENDENT_AMBULATORY_CARE_PROVIDER_SITE_OTHER): Payer: Managed Care, Other (non HMO) | Admitting: Obstetrics & Gynecology

## 2016-07-22 VITALS — BP 112/80 | HR 72 | Resp 16 | Ht 63.0 in | Wt 144.0 lb

## 2016-07-22 DIAGNOSIS — Z124 Encounter for screening for malignant neoplasm of cervix: Secondary | ICD-10-CM

## 2016-07-22 DIAGNOSIS — D069 Carcinoma in situ of cervix, unspecified: Secondary | ICD-10-CM | POA: Diagnosis not present

## 2016-07-22 DIAGNOSIS — R87615 Unsatisfactory cytologic smear of cervix: Secondary | ICD-10-CM

## 2016-07-22 NOTE — Progress Notes (Signed)
30 y.o. G0P0 Married CaucasianF here for repeat Pap smear due to history of insufficient cells noted on Pap 07/08/16 at [facility].  HR HPV was negative same date of service.  She was on her cycle that day.  Pt with hx of CIN 3 and LEEP in 2015.  This is second year of pap and HR HPV testing and, if normal, will return to routine screening.    Today she denies vaginal symptoms or STD concerns.  EXAM: BP 112/80   Pulse 72   Resp 16   Ht 5\' 3"  (1.6 m)   Wt 144 lb (65.3 kg)   LMP 07/05/2016 (Exact Date)   BMI 25.51 kg/m  General appearance:  WNWD Female, NAD VULVA: normal appearing vulva with no masses, tenderness or lesions VAGINA: normal appearing vagina with normal color and discharge, no lesions CERVIX: normal appearing cervix without discharge or lesions   PAP: Pap smear done today   Assessment: History of insufficient cells on pap smear H/O CIN 3 with LEEP 2015  Plan: if pap is normal, pt will return to routine pap screening.

## 2016-07-27 LAB — IPS PAP SMEAR ONLY

## 2016-09-06 ENCOUNTER — Ambulatory Visit: Payer: Managed Care, Other (non HMO) | Admitting: Obstetrics & Gynecology

## 2016-11-18 ENCOUNTER — Telehealth: Payer: Self-pay | Admitting: Family Medicine

## 2016-11-18 NOTE — Telephone Encounter (Signed)
Agree with OV 

## 2016-11-18 NOTE — Telephone Encounter (Signed)
[facility] Client TELEPHONE ADVICE RECORD [facility] Patient Name: Carly SoursNNIE Dais DOB: 12/18/1985 Initial Comment Caller states that his wife is having nausea, vomiting and diarrhea, no fever Nurse Assessment Nurse: Debera Latalston, RN, Tinnie GensJeffrey Date/Time Lamount Cohen(Eastern Time): 11/18/2016 12:32:28 PM Confirm and document reason for call. If symptomatic, describe symptoms. ---Caller states that his wife is having nausea, vomiting and diarrhea, no fever. Symptoms started this morning. 4 bouts of vomiting and multiple bouts of diarrhea. Does the patient have any new or worsening symptoms? ---Yes Will a triage be completed? ---Yes Related visit to physician within the last 2 weeks? ---No Does the PT have any chronic conditions? (i.e. diabetes, asthma, etc.) ---No Is the patient pregnant or possibly pregnant? (Ask all females between the ages of 3012-55) ---No Is this a behavioral health or substance abuse call? ---No Guidelines Guideline Title Affirmed Question Affirmed Notes Vomiting [1] Constant abdominal pain AND [2] present > 2 hours Final Disposition User See Physician within 4 Hours (or PCP triage) Debera Latalston, RN, Tinnie GensJeffrey Referrals [facility] Disagree/Comply: Comply

## 2016-11-27 ENCOUNTER — Encounter (HOSPITAL_COMMUNITY): Payer: Self-pay | Admitting: *Deleted

## 2016-11-27 ENCOUNTER — Ambulatory Visit (HOSPITAL_COMMUNITY)
Admission: EM | Admit: 2016-11-27 | Discharge: 2016-11-27 | Disposition: A | Discharge status: Home / Self Care | Payer: Managed Care, Other (non HMO) | Attending: Family Medicine | Admitting: Family Medicine

## 2016-11-27 DIAGNOSIS — R509 Fever, unspecified: Secondary | ICD-10-CM | POA: Diagnosis present

## 2016-11-27 DIAGNOSIS — J029 Acute pharyngitis, unspecified: Secondary | ICD-10-CM | POA: Diagnosis not present

## 2016-11-27 DIAGNOSIS — Z79899 Other long term (current) drug therapy: Secondary | ICD-10-CM | POA: Diagnosis not present

## 2016-11-27 LAB — POCT RAPID STREP A: Streptococcus, Group A Screen (Direct): NEGATIVE

## 2016-11-27 MED ORDER — IPRATROPIUM BROMIDE 0.06 % NA SOLN: 2.0000 | Freq: Four times a day (QID) | NASAL | 1 refills | Status: DC

## 2016-11-27 MED ORDER — GUAIFENESIN-CODEINE 100-10 MG/5ML PO SYRP: 10.0000 mL | ORAL_SOLUTION | Freq: Four times a day (QID) | ORAL | 0 refills | Status: DC | PRN

## 2016-11-27 MED ORDER — DOXYCYCLINE HYCLATE 100 MG PO CAPS: 100.0000 mg | ORAL_CAPSULE | Freq: Two times a day (BID) | ORAL | 0 refills | Status: DC

## 2016-11-27 NOTE — ED Triage Notes (Signed)
Reports fever up to 100.3, sore throat, body aches since yesterday morning.  Has been taking Tyl - last dose @ 0600.  Denies significiant cough.

## 2016-11-27 NOTE — Discharge Instructions (Signed)
Drink lots of fluids, take all of medicine, use lozenges as needed.return if needed °

## 2016-11-27 NOTE — ED Provider Notes (Signed)
[facility]    CSN: [id number] Arrival date & time: 11/27/16  1620     History   Chief Complaint Chief Complaint  Patient presents with  . Fever  . Sore Throat    HPI Carly Suarez is a 31 y.o. female.   The history is provided by the patient.  Sore Throat  This is a new problem. The current episode started yesterday. The problem has been gradually worsening. Pertinent negatives include no chest pain, no abdominal pain and no headaches. The symptoms are aggravated by swallowing.    Past Medical History:  Diagnosis Date  . ADD (attention deficit disorder)   . History of chicken pox   . History of shingles     Patient Active Problem List   Diagnosis Date Noted  . Left anterior knee pain 04/15/2016  . Rash and nonspecific skin eruption 04/15/2016  . CIN III (cervical intraepithelial neoplasia III) 07/31/2014  . HSV-2 (herpes simplex virus 2) infection 05/14/2014  . Screening for lipoid disorders 01/30/2014    Past Surgical History:  Procedure Laterality Date  . LEEP N/A 06/30/2014   Procedure: LOOP ELECTROSURGICAL EXCISION PROCEDURE (LEEP);  Surgeon: Annamaria BootsMary Suzanne Miller, MD;  Location: [facility];  Service: Gynecology;  Laterality: N/A;  . WISDOM TOOTH EXTRACTION  2005    OB History    Gravida Para Term Preterm AB Living   0             SAB TAB Ectopic Multiple Live Births                   Home Medications    Prior to Admission medications   Medication Sig Start Date End Date Taking? Authorizing Provider  acetaminophen (TYLENOL) 500 MG tablet Take 1,000 mg by mouth every 6 (six) hours as needed for moderate pain or headache.   Yes Historical Provider, MD  Norgestimate-Ethinyl Estradiol Triphasic (TRI-SPRINTEC) 0.18/0.215/0.25 MG-35 MCG tablet Take 1 tablet by mouth daily. 07/08/16  Yes Jerene BearsMary S Miller, MD  sertraline (ZOLOFT) 100 MG tablet  07/19/16  Yes Historical Provider, MD  triamcinolone cream (KENALOG) 0.5 % Apply 1 application topically 2 (two)  times daily. 04/15/16  Yes Amy Michelle NasutiE Bedsole, MD  valACYclovir (VALTREX) 1000 MG tablet Take 1 tablet (1,000 mg total) by mouth daily. Increase to bid x 3 days for outbreak 07/08/16  Yes Jerene BearsMary S Miller, MD  doxycycline (VIBRAMYCIN) 100 MG capsule Take 1 capsule (100 mg total) by mouth 2 (two) times daily. 11/27/16   Linna HoffJames D Danyle Boening, MD  fluticasone (FLONASE) 50 MCG/ACT nasal spray Place 2 sprays into both nostrils daily. 04/03/15   Doreene NestKatherine K Clark, NP  guaiFENesin-codeine [facility](ROBITUSSIN AC) 100-10 MG/5ML syrup Take 10 mLs by mouth 4 (four) times daily as needed for cough. 11/27/16   Linna HoffJames D Nykole Matos, MD  ipratropium (ATROVENT) 0.06 % nasal spray Place 2 sprays into both nostrils 4 (four) times daily. 11/27/16   Linna HoffJames D Janesa Dockery, MD    Family History Family History  Problem Relation Age of Onset  . Hypertension Mother   . Hypertension Father   . Prostate cancer Father [phone]  . Melanoma Father 1363  . Diabetes Maternal Grandmother   . Arthritis Maternal Grandmother   . Hypertension Maternal Grandmother   . Prostate cancer Maternal Grandfather   . Heart disease Paternal Grandmother   . Other Brother [phone]    Batten Disease - gentic disorder  - attacks brain and nervous system     Social History  Social History  Substance Use Topics  . Smoking status: Never Smoker  . Smokeless tobacco: Never Used  . Alcohol use Yes     Comment: 0-3 beers or glasses of wine daily     Allergies   Patient has no known allergies.   Review of Systems Review of Systems  Constitutional: Positive for chills and fever.  HENT: Positive for congestion, postnasal drip and rhinorrhea.   Respiratory: Positive for cough.   Cardiovascular: Negative for chest pain.  Gastrointestinal: Negative for abdominal pain.  Neurological: Negative for headaches.  All other systems reviewed and are negative.    Physical Exam Triage Vital Signs ED Triage Vitals [11/27/16 1640]  Enc Vitals Group     BP 123/65     Pulse Rate 76     Resp 16      Temp 98.1 F (36.7 C)     Temp Source Oral     SpO2 100 %     Weight      Height      Head Circumference      Peak Flow      Pain Score      Pain Loc      Pain Edu?      Excl. in GC?    No data found.   Updated Vital Signs BP 123/65   Pulse 76   Temp 98.1 F (36.7 C) (Oral)   Resp 16   LMP 11/22/2016 (Exact Date)   SpO2 100%   Visual Acuity Right Eye Distance:   Left Eye Distance:   Bilateral Distance:    Right Eye Near:   Left Eye Near:    Bilateral Near:     Physical Exam  Constitutional: She is oriented to person, place, and time. She appears well-developed and well-nourished.  HENT:  Right Ear: External ear normal.  Left Ear: External ear normal.  Mouth/Throat: Oropharynx is clear and moist.  Neck: Normal range of motion. Neck supple.  Cardiovascular: Normal rate.   Pulmonary/Chest: Effort normal.  Lymphadenopathy:    She has no cervical adenopathy.  Neurological: She is alert and oriented to person, place, and time.  Skin: Skin is warm.  Nursing note and vitals reviewed.    UC Treatments / Results  Labs (all labs ordered are listed, but only abnormal results are displayed) Labs Reviewed  CULTURE, GROUP A STREP [facility])  POCT RAPID STREP A    EKG  EKG Interpretation None       Radiology No results found.  Procedures Procedures (including critical care time)  Medications Ordered in UC Medications - No data to display   Initial Impression / Assessment and Plan / UC Course  I have reviewed the triage vital signs and the nursing notes.  Pertinent labs & imaging results that were available during my care of the patient were reviewed by me and considered in my medical decision making (see chart for details).       Final Clinical Impressions(s) / UC Diagnoses   Final diagnoses:  Viral pharyngitis    New Prescriptions Discharge Medication List as of 11/27/2016  5:37 PM    START taking these medications   Details  doxycycline  (VIBRAMYCIN) 100 MG capsule Take 1 capsule (100 mg total) by mouth 2 (two) times daily., Starting Sun 11/27/2016, Print    guaiFENesin-codeine (ROBITUSSIN AC) 100-10 MG/5ML syrup Take 10 mLs by mouth 4 (four) times daily as needed for cough., Starting Sun 11/27/2016, Print    ipratropium (ATROVENT) 0.06 %  nasal spray Place 2 sprays into both nostrils 4 (four) times daily., Starting Sun 11/27/2016, Print         Linna Hoff, MD 12/18/16 [phone]

## 2016-11-30 LAB — CULTURE, GROUP A STREP (THRC)

## 2016-12-15 NOTE — Telephone Encounter (Signed)
Made in Error please disregard

## 2017-01-10 ENCOUNTER — Encounter (INDEPENDENT_AMBULATORY_CARE_PROVIDER_SITE_OTHER): Payer: Self-pay

## 2017-01-10 ENCOUNTER — Ambulatory Visit (INDEPENDENT_AMBULATORY_CARE_PROVIDER_SITE_OTHER): Payer: Managed Care, Other (non HMO) | Admitting: Family Medicine

## 2017-01-10 ENCOUNTER — Encounter: Payer: Self-pay | Admitting: Family Medicine

## 2017-01-10 DIAGNOSIS — S161XXA Strain of muscle, fascia and tendon at neck level, initial encounter: Secondary | ICD-10-CM | POA: Diagnosis not present

## 2017-01-10 DIAGNOSIS — R0981 Nasal congestion: Secondary | ICD-10-CM

## 2017-01-10 MED ORDER — FLUTICASONE PROPIONATE 50 MCG/ACT NA SUSP: 2.0000 | Freq: Every day | NASAL | 5 refills | Status: DC

## 2017-01-10 MED ORDER — CYCLOBENZAPRINE HCL 10 MG PO TABS: 10.0000 mg | ORAL_TABLET | Freq: Every evening | ORAL | 0 refills | Status: DC | PRN

## 2017-01-10 NOTE — Progress Notes (Signed)
Subjective:    Patient ID: Carly Suarez, female    DOB: 05/26/1986, 31 y.o.   MRN: [id number]  HPI    31 year old female presents with new onset pain in neck x 2 week. Right  neck is sore at right of vertebra. Worst with waking up in morning.  Cannot get comfortable getting to sleep at night. If tilts head to left and forward and back towards right.  No fall, no injury, no change in activity.  No new numbness, no new weakness in arms.  No loss of control of urine. No fever.  No new neurologic symptoms.  She has been having and increase in milder headache. Not migraines.  She has tried ibuprofen and tylenol as well as heat and ice.  Blood pressure 122/80, pulse 65, temperature 98 F (36.7 C), temperature source Oral, height 5' 3.6" (1.615 m), weight 142 lb 12 oz (64.8 kg), last menstrual period 12/20/2016.  HX no history of neck issue. Does have history of low back apin.  Review of Systems  Constitutional: Negative for fatigue and fever.  HENT: Negative for ear pain.   Eyes: Negative for pain.  Respiratory: Negative for chest tightness and shortness of breath.   Cardiovascular: Negative for chest pain, palpitations and leg swelling.  Gastrointestinal: Negative for abdominal pain.  Genitourinary: Negative for dysuria.       Objective:   Physical Exam  Constitutional: Vital signs are normal. She appears well-developed and well-nourished. She is cooperative.  Non-toxic appearance. She does not appear ill. No distress.  HENT:  Head: Normocephalic.  Right Ear: Hearing, tympanic membrane, external ear and ear canal normal. Tympanic membrane is not erythematous, not retracted and not bulging.  Left Ear: Hearing, tympanic membrane, external ear and ear canal normal. Tympanic membrane is not erythematous, not retracted and not bulging.  Nose: No mucosal edema or rhinorrhea. Right sinus exhibits no maxillary sinus tenderness and no frontal sinus tenderness. Left sinus exhibits no  maxillary sinus tenderness and no frontal sinus tenderness.  Mouth/Throat: Uvula is midline, oropharynx is clear and moist and mucous membranes are normal.  Eyes: Conjunctivae, EOM and lids are normal. Pupils are equal, round, and reactive to light. Lids are everted and swept, no foreign bodies found.  Neck: Trachea normal and normal range of motion. Neck supple. Carotid bruit is not present. No thyroid mass and no thyromegaly present.  Cardiovascular: Normal rate, regular rhythm, S1 normal, S2 normal, normal heart sounds, intact distal pulses and normal pulses.  Exam reveals no gallop and no friction rub.   No murmur heard. Pulmonary/Chest: Effort normal and breath sounds normal. No tachypnea. No respiratory distress. She has no decreased breath sounds. She has no wheezes. She has no rhonchi. She has no rales.  Abdominal: Soft. Normal appearance and bowel sounds are normal. There is no tenderness.  Musculoskeletal:       Cervical back: She exhibits decreased range of motion, tenderness and spasm. She exhibits no bony tenderness and no swelling.       Back:  ttp over trap upper  Neg spurlings.  Neurological: She is alert. She has normal strength. No cranial nerve deficit or sensory deficit. Coordination and gait normal.  Skin: Skin is warm, dry and intact. No rash noted.  Psychiatric: Her speech is normal and behavior is normal. Judgment and thought content normal. Her mood appears not anxious. Cognition and memory are normal. She does not exhibit a depressed mood.  Assessment & Plan:

## 2017-01-10 NOTE — Patient Instructions (Addendum)
Ibuprofen 800 mg every 8 hours on full stomach.  Start muscle relaxant at night.  Heat and start gentle ROM exercises.   Massage can help. Follow up if not improving in 2 weeks.

## 2017-01-10 NOTE — Progress Notes (Signed)
Pre visit review using our clinic review tool, if applicable. No additional management support is needed unless otherwise documented below in the visit note. 

## 2017-01-10 NOTE — Assessment & Plan Note (Signed)
No sign of radiculopathy. Treat with heat, NSAID, massage, ROM exercises and muscle relaxant.

## 2017-01-16 ENCOUNTER — Encounter: Payer: Self-pay | Admitting: Nurse Practitioner

## 2017-01-16 ENCOUNTER — Ambulatory Visit (INDEPENDENT_AMBULATORY_CARE_PROVIDER_SITE_OTHER): Payer: Managed Care, Other (non HMO) | Admitting: Nurse Practitioner

## 2017-01-16 VITALS — BP 114/76 | HR 60 | Ht 63.5 in | Wt 143.0 lb

## 2017-01-16 DIAGNOSIS — N760 Acute vaginitis: Secondary | ICD-10-CM

## 2017-01-16 NOTE — Progress Notes (Signed)
31 y.o. Married Caucasian female G0P0 here with complaint of vaginal symptoms of itching, and increase discharge. Describes discharge as white to yellow.  Onset of symptoms 5 days ago. Denies new personal products or vaginal dryness.  She has been on muscle relaxant last week.  Soon after noted the vaginal discharge.   Since then has stopped the Flexeril and symptoms seem to be better.  No STD concerns. Urinary symptoms none . Contraception is OCP.  LMP: 12/20/16.   O:  Healthy female WDWN Affect: normal, orientation x 3  Exam: no distress Abdomen: soft and non tender Lymph node: no enlargement or tenderness Pelvic exam: External genital: normal female BUS: negative with a small epidermal cyst on inside of left vulva Vagina: thin white discharge noted along with light pink spotting.  Affirm taken. Cervix: normal, non tender, no CMT     A: R/O Vaginitis  Recent neck and shoulder pain  - most likely from stress   P: Discussed findings of vaginitis and etiology. Discussed Aveeno or baking soda sitz bath for comfort. Avoid moist clothes or pads for extended period of time. If working out in gym clothes or swim suits for long periods of time change underwear or bottoms of swimsuit if possible. Olive Oil/Coconut Oil use for skin protection prior to activity can be used to external skin.  Rx: will await results  Follow with Affirm  If BV will want Flagyl since menses is starting RV prn

## 2017-01-16 NOTE — Progress Notes (Signed)
Encounter reviewed by Dr. Brook Amundson C. Silva.  

## 2017-01-16 NOTE — Patient Instructions (Signed)
Bacterial Vaginosis Bacterial vaginosis is an infection of the vagina. It happens when too many germs (bacteria) grow in the vagina. This infection puts you at risk for infections from sex (STIs). Treating this infection can lower your risk for some STIs. You should also treat this if you are pregnant. It can cause your baby to be born early. Follow these instructions at home: Medicines   Take over-the-counter and prescription medicines only as told by your doctor.  Take or use your antibiotic medicine as told by your doctor. Do not stop taking or using it even if you start to feel better. General instructions   If you your sexual partner is a woman, tell her that you have this infection. She needs to get treatment if she has symptoms. If you have a female partner, he does not need to be treated.  During treatment:  Avoid sex.  Do not douche.  Avoid alcohol as told.  Avoid breastfeeding as told.  Drink enough fluid to keep your pee (urine) clear or pale yellow.  Keep your vagina and butt (rectum) clean.  Wash the area with warm water every day.  Wipe from front to back after you use the toilet.  Keep all follow-up visits as told by your doctor. This is important. Preventing this condition   Do not douche.  Use only warm water to wash around your vagina.  Use protection when you have sex. This includes:  Latex condoms.  Dental dams.  Limit how many people you have sex with. It is best to only have sex with the same person (be monogamous).  Get tested for STIs. Have your partner get tested.  Wear underwear that is cotton or lined with cotton.  Avoid tight pants and pantyhose. This is most important in summer.  Do not use any products that have nicotine or tobacco in them. These include cigarettes and e-cigarettes. If you need help quitting, ask your doctor.  Do not use illegal drugs.  Limit how much alcohol you drink. Contact a doctor if:  Your symptoms do not  get better, even after you are treated.  You have more discharge or pain when you pee (urinate).  You have a fever.  You have pain in your belly (abdomen).  You have pain with sex.  Your bleed from your vagina between periods. Summary  This infection happens when too many germs (bacteria) grow in the vagina.  Treating this condition can lower your risk for some infections from sex (STIs).  You should also treat this if you are pregnant. It can cause early (premature) birth.  Do not stop taking or using your antibiotic medicine even if you start to feel better. This information is not intended to replace advice given to you by your health care provider. Make sure you discuss any questions you have with your health care provider. Document Released: 08/16/2008 Document Revised: 07/23/2016 Document Reviewed: 07/23/2016 Elsevier Interactive Patient Education  2017 Else[employer] Inc. Vaginal Yeast infection, Adult Vaginal yeast infection is a condition that causes soreness, swelling, and redness (inflammation) of the vagina. It also causes vaginal discharge. This is a common condition. Some women get this infection frequently. What are the causes? This condition is caused by a change in the normal balance of the yeast (candida) and bacteria that live in the vagina. This change causes an overgrowth of yeast, which causes the inflammation. What increases the risk? This condition is more likely to develop in:  Women who take antibiotic medicines.    Women who have diabetes.  Women who take birth control pills.  Women who are pregnant.  Women who douche often.  Women who have a weak defense (immune) system.  Women who have been taking steroid medicines for a long time.  Women who frequently wear tight clothing. What are the signs or symptoms? Symptoms of this condition include:  White, thick vaginal discharge.  Swelling, itching, redness, and irritation of the vagina. The lips of  the vagina (vulva) may be affected as well.  Pain or a burning feeling while urinating.  Pain during sex. How is this diagnosed? This condition is diagnosed with a medical history and physical exam. This will include a pelvic exam. Your health care provider will examine a sample of your vaginal discharge under a microscope. Your health care provider may send this sample for testing to confirm the diagnosis. How is this treated? This condition is treated with medicine. Medicines may be over-the-counter or prescription. You may be told to use one or more of the following:  Medicine that is taken orally.  Medicine that is applied as a cream.  Medicine that is inserted directly into the vagina (suppository). Follow these instructions at home:  Take or apply over-the-counter and prescription medicines only as told by your health care provider.  Do not have sex until your health care provider has approved. Tell your sex partner that you have a yeast infection. That person should go to his or her health care provider if he or she develops symptoms.  Do not wear tight clothes, such as pantyhose or tight pants.  Avoid using tampons until your health care provider approves.  Eat more yogurt. This may help to keep your yeast infection from returning.  Try taking a sitz bath to help with discomfort. This is a warm water bath that is taken while you are sitting down. The water should only come up to your hips and should cover your buttocks. Do this 3-4 times per day or as told by your health care provider.  Do not douche.  Wear breathable, cotton underwear.  If you have diabetes, keep your blood sugar levels under control. Contact a health care provider if:  You have a fever.  Your symptoms go away and then return.  Your symptoms do not get better with treatment.  Your symptoms get worse.  You have new symptoms.  You develop blisters in or around your vagina.  You have blood coming  from your vagina and it is not your menstrual period.  You develop pain in your abdomen. This information is not intended to replace advice given to you by your health care provider. Make sure you discuss any questions you have with your health care provider. Document Released: 08/17/2005 Document Revised: 04/20/2016 Document Reviewed: 05/11/2015 Elsevier Interactive Patient Education  2017 Elsevier Inc.  

## 2017-01-17 LAB — WET PREP BY MOLECULAR PROBE
CANDIDA SPECIES: DETECTED — AB
GARDNERELLA VAGINALIS: NOT DETECTED
TRICHOMONAS VAG: NOT DETECTED

## 2017-01-17 MED ORDER — FLUCONAZOLE 150 MG PO TABS: 150.0000 mg | ORAL_TABLET | Freq: Once | ORAL | 0 refills | Status: AC

## 2017-01-17 NOTE — Addendum Note (Signed)
Addended by: Luisa DagoPHILLIPS, Brad Lieurance C on: 01/17/2017 08:41 AM   Modules accepted: Orders

## 2017-03-17 ENCOUNTER — Encounter: Payer: Self-pay | Admitting: Family Medicine

## 2017-04-14 ENCOUNTER — Other Ambulatory Visit: Payer: Managed Care, Other (non HMO)

## 2017-04-14 ENCOUNTER — Telehealth: Payer: Self-pay | Admitting: Family Medicine

## 2017-04-14 DIAGNOSIS — Z13220 Encounter for screening for lipoid disorders: Secondary | ICD-10-CM

## 2017-04-14 NOTE — Telephone Encounter (Signed)
-----   Message from Baldomero LamyNatasha C Chavers sent at 04/12/2017  1:41 PM EDT ----- Regarding: Labs Fri 5/25, need orders. Thanks! :-) Please order  future cpx labs for pt's upcoming lab appt. Thanks Rodney Boozeasha

## 2017-04-19 ENCOUNTER — Other Ambulatory Visit (INDEPENDENT_AMBULATORY_CARE_PROVIDER_SITE_OTHER): Payer: Managed Care, Other (non HMO)

## 2017-04-19 ENCOUNTER — Encounter (INDEPENDENT_AMBULATORY_CARE_PROVIDER_SITE_OTHER): Payer: Self-pay

## 2017-04-19 DIAGNOSIS — Z13220 Encounter for screening for lipoid disorders: Secondary | ICD-10-CM

## 2017-04-19 LAB — LIPID PANEL
CHOL/HDL RATIO: 3
Cholesterol: 192 mg/dL (ref 0–200)
HDL: 60.8 mg/dL (ref >39.00)
LDL Cholesterol: 104 mg/dL — ABNORMAL HIGH (ref 0–99)
NONHDL: 130.98
Triglycerides: 135 mg/dL (ref 0.0–149.0)
VLDL: 27 mg/dL (ref 0.0–40.0)

## 2017-04-19 LAB — COMPREHENSIVE METABOLIC PANEL
ALK PHOS: 42 U/L (ref 39–117)
ALT: 19 U/L (ref 0–35)
AST: 18 U/L (ref 0–37)
Albumin: 4.4 g/dL (ref 3.5–5.2)
BUN: 11 mg/dL (ref 6–23)
CHLORIDE: 105 meq/L (ref 96–112)
CO2: 27 meq/L (ref 19–32)
Calcium: 9.7 mg/dL (ref 8.4–10.5)
Creatinine, Ser: 0.67 mg/dL (ref 0.40–1.20)
GFR: 109 mL/min (ref >60.00)
GLUCOSE: 94 mg/dL (ref 70–99)
Potassium: 4.1 mEq/L (ref 3.5–5.1)
SODIUM: 137 meq/L (ref 135–145)
TOTAL PROTEIN: 6.8 g/dL (ref 6.0–8.3)
Total Bilirubin: 0.7 mg/dL (ref 0.2–1.2)

## 2017-04-20 ENCOUNTER — Encounter: Payer: Self-pay | Admitting: Family Medicine

## 2017-04-20 ENCOUNTER — Ambulatory Visit (INDEPENDENT_AMBULATORY_CARE_PROVIDER_SITE_OTHER): Payer: Managed Care, Other (non HMO) | Admitting: Family Medicine

## 2017-04-20 VITALS — BP 129/81 | HR 56 | Temp 97.7°F | Ht 63.0 in | Wt 146.8 lb

## 2017-04-20 DIAGNOSIS — Z0000 Encounter for general adult medical examination without abnormal findings: Secondary | ICD-10-CM | POA: Diagnosis not present

## 2017-04-20 DIAGNOSIS — M5090 Cervical disc disorder, unspecified, unspecified cervical region: Secondary | ICD-10-CM | POA: Diagnosis not present

## 2017-04-20 DIAGNOSIS — F411 Generalized anxiety disorder: Secondary | ICD-10-CM | POA: Diagnosis not present

## 2017-04-20 DIAGNOSIS — F321 Major depressive disorder, single episode, moderate: Secondary | ICD-10-CM | POA: Insufficient documentation

## 2017-04-20 NOTE — Progress Notes (Signed)
Subjective:    Patient ID: Carly Suarez, female    DOB: [birth year], 31 y.o.   MRN: [id number]  HPI  The patient is here for annual wellness exam and preventative care.     Cervical neck pain: She has seen Dr. Mauri Pole neurosurgeon MRI: bulging disc C3 -C4 and C5.  Started on diclofenac. Using twice daily.Marland Kitchen Has helped some.. She can function day to day. Not keeping her up at night. No new numbness, no weakness, no new incontinence.Marland Kitchen  Has follow up neck month with Dr. Mauri Pole  She is seeing psychiatrist for depression, anxiety.  She has stopped sertraline in last month as it was not helpful and it made her think more about hurting herself . She never would do this.  She works night shift.  Has appt with them today.  Labs reviewed in detail. Nml CMET. Lab Results  Component Value Date   CHOL 192 04/19/2017   HDL 60.80 04/19/2017   LDLCALC 104 (H) 04/19/2017   TRIG 135.0 04/19/2017   CHOLHDL 3 04/19/2017   Exercise: none Diet: moderate  Body mass index is 26 kg/m. Wt Readings from Last 3 Encounters:  04/20/17 146 lb 12 oz (66.6 kg)  01/16/17 143 lb (64.9 kg)  01/10/17 142 lb 12 oz (64.8 kg)    Blood pressure 129/81, pulse (!) 56, temperature 97.7 F (36.5 C), temperature source Oral, height 5\' 3"  (1.6 m), weight 146 lb 12 oz (66.6 kg), last menstrual period 04/12/2017.  Social History /Family History/Past Medical History reviewed in detail and updated in EMR if needed.  Review of Systems  Constitutional: Positive for fatigue. Negative for fever.  HENT: Negative for congestion.   Eyes: Negative for pain.  Respiratory: Negative for cough and shortness of breath.   Cardiovascular: Negative for chest pain, palpitations and leg swelling.  Gastrointestinal: Negative for abdominal pain.  Genitourinary: Negative for dysuria and vaginal bleeding.  Musculoskeletal: Negative for back pain.  Neurological: Negative for syncope, light-headedness and headaches.    Psychiatric/Behavioral: Positive for dysphoric mood. The patient is nervous/anxious.        Objective:   Physical Exam  Constitutional: Vital signs are normal. She appears well-developed and well-nourished. She is cooperative.  Non-toxic appearance. She does not appear ill. No distress.  HENT:  Head: Normocephalic.  Right Ear: Hearing, tympanic membrane, external ear and ear canal normal.  Left Ear: Hearing, tympanic membrane, external ear and ear canal normal.  Nose: Nose normal.  Eyes: Conjunctivae, EOM and lids are normal. Pupils are equal, round, and reactive to light. Lids are everted and swept, no foreign bodies found.  Neck: Trachea normal and normal range of motion. Neck supple. Carotid bruit is not present. No thyroid mass and no thyromegaly present.  Cardiovascular: Normal rate, regular rhythm, S1 normal, S2 normal, normal heart sounds and intact distal pulses.  Exam reveals no gallop.   No murmur heard. Pulmonary/Chest: Effort normal and breath sounds normal. No respiratory distress. She has no wheezes. She has no rhonchi. She has no rales.  Abdominal: Soft. Normal appearance and bowel sounds are normal. She exhibits no distension, no fluid wave, no abdominal bruit and no mass. There is no hepatosplenomegaly. There is no tenderness. There is no rebound, no guarding and no CVA tenderness. No hernia.  Lymphadenopathy:    She has no cervical adenopathy.    She has no axillary adenopathy.  Neurological: She is alert. She has normal strength. No cranial nerve deficit or sensory deficit.  Skin: Skin  is warm, dry and intact. No rash noted.  Psychiatric: Her speech is normal and behavior is normal. Judgment normal. Her mood appears not anxious. Cognition and memory are normal. She does not exhibit a depressed mood.          Assessment & Plan:  The patient's preventative maintenance and recommended screening tests for an annual wellness exam were reviewed in full today. Brought up  to date unless services declined.  Counselled on the importance of diet, exercise, and its role in overall health and mortality. The patient's FH and SH was reviewed, including their home life, tobacco status, and drug and alcohol status.   Vaccines: Tdap uptodate 2017, has received HPV x 3 PAP/Breast: per GYN Colon: no early family history.  nonsmoker  STD testing: refused

## 2017-04-20 NOTE — Patient Instructions (Addendum)
Work on trying to increase exercise to 3-5 times a week.     Exercising to Stay Healthy Exercising regularly is important. It has many health benefits, such as:  Improving your overall fitness, flexibility, and endurance.  Increasing your bone density.  Helping with weight control.  Decreasing your body fat.  Increasing your muscle strength.  Reducing stress and tension.  Improving your overall health.  In order to become healthy and stay healthy, it is recommended that you do moderate-intensity and vigorous-intensity exercise. You can tell that you are exercising at a moderate intensity if you have a higher heart rate and faster breathing, but you are still able to hold a conversation. You can tell that you are exercising at a vigorous intensity if you are breathing much harder and faster and cannot hold a conversation while exercising. How often should I exercise? Choose an activity that you enjoy and set realistic goals. Your health care provider can help you to make an activity plan that works for you. Exercise regularly as directed by your health care provider. This may include:  Doing resistance training twice each week, such as: ? Push-ups. ? Sit-ups. ? Lifting weights. ? Using resistance bands.  Doing a given intensity of exercise for a given amount of time. Choose from these options: ? 150 minutes of moderate-intensity exercise every week. ? 75 minutes of vigorous-intensity exercise every week. ? A mix of moderate-intensity and vigorous-intensity exercise every week.  Children, pregnant women, people who are out of shape, people who are overweight, and older adults may need to consult a health care provider for individual recommendations. If you have any sort of medical condition, be sure to consult your health care provider before starting a new exercise program. What are some exercise ideas? Some moderate-intensity exercise ideas include:  Walking at a rate of 1  mile in 15 minutes.  Biking.  Hiking.  Golfing.  Dancing.  Some vigorous-intensity exercise ideas include:  Walking at a rate of at least 4.5 miles per hour.  Jogging or running at a rate of 5 miles per hour.  Biking at a rate of at least 10 miles per hour.  Lap swimming.  Roller-skating or in-line skating.  Cross-country skiing.  Vigorous competitive sports, such as football, basketball, and soccer.  Jumping rope.  Aerobic dancing.  What are some everyday activities that can help me to get exercise?  Yard work, such as: ? Pushing a Surveyor, mining. ? Raking and bagging leaves.  Washing and waxing your car.  Pushing a stroller.  Shoveling snow.  Gardening.  Washing windows or floors. How can I be more active in my day-to-day activities?  Use the stairs instead of the elevator.  Take a walk during your lunch break.  If you drive, park your car farther away from work or school.  If you take public transportation, get off one stop early and walk the rest of the way.  Make all of your phone calls while standing up and walking around.  Get up, stretch, and walk around every 30 minutes throughout the day. What guidelines should I follow while exercising?  Do not exercise so much that you hurt yourself, feel dizzy, or get very short of breath.  Consult your health care provider before starting a new exercise program.  Wear comfortable clothes and shoes with good support.  Drink plenty of water while you exercise to prevent dehydration or heat stroke. Body water is lost during exercise and must be replaced.  Work out until you breathe faster and your heart beats faster. This information is not intended to replace advice given to you by your health care provider. Make sure you discuss any questions you have with your health care provider. Document Released: 12/10/2010 Document Revised: 04/14/2016 Document Reviewed: 04/10/2014 Elsevier Interactive Patient  Education  [employer]2018 [employer] Inc.

## 2017-04-20 NOTE — Assessment & Plan Note (Addendum)
Moderate control.. has follow up today with mood disorder clinic. Considering different med to treat.

## 2017-04-20 NOTE — Assessment & Plan Note (Signed)
Followed by Dr. Mauri PoleNeudleman. Treated with NSAIDs.

## 2017-05-11 ENCOUNTER — Telehealth: Payer: Self-pay | Admitting: Obstetrics & Gynecology

## 2017-05-11 NOTE — Telephone Encounter (Signed)
Spoke with patient. Patient is currently taking Tri-Sprintec OCP. Reports she usually takes a week off of her pills during the fourth week of her pill pack and then starts a new pack. When she got to the fourth week of her pill pack she forgot to take a week off and immediately started a new pack. Is on day 5 of new pack. Feels like she needs to have a menses. Asking if she needs to continue taking pills as directed or stop taking the pills to start a menses then restart. Advised will review with Dr.Miller and return call.

## 2017-05-11 NOTE — Telephone Encounter (Signed)
Left message to call Kaitlyn at [phone]. 

## 2017-05-11 NOTE — Telephone Encounter (Signed)
Patient is asking to talk with Dr.Miller's nurse about her birth control. °

## 2017-05-12 NOTE — Telephone Encounter (Signed)
Spoke with patient. Advised of message as seen below from Dr.Miller. Patient is agreeable and verbalizes understanding.  Routing to provider for final review. Patient agreeable to disposition. Will close encounter   

## 2017-05-12 NOTE — Telephone Encounter (Signed)
It would be easiest with the tri-sprintec to just finish the pack and then have a cycle.  Then she would start a new pack.  If feels she would like to go ahead and have a cycle now, she needs to stop the pills for one week but she will need to start a brand new pack.  Either way is fine.

## 2017-07-16 ENCOUNTER — Other Ambulatory Visit: Payer: Self-pay | Admitting: Obstetrics & Gynecology

## 2017-07-17 NOTE — Telephone Encounter (Signed)
Medication refill request: OCP &  Valtrex  Last AEX:  07-22-16  Next AEX: 10-06-17  Last MMG (if hormonal medication request): N/A Refill authorized: please advise

## 2017-10-06 ENCOUNTER — Ambulatory Visit: Payer: Managed Care, Other (non HMO) | Admitting: Obstetrics & Gynecology

## 2017-11-22 ENCOUNTER — Encounter: Payer: Self-pay | Admitting: Obstetrics & Gynecology

## 2017-11-22 ENCOUNTER — Ambulatory Visit: Payer: Self-pay | Admitting: Obstetrics & Gynecology

## 2017-11-22 NOTE — Progress Notes (Deleted)
32 y.o. G0P0 MarriedCaucasianF here for annual exam.    No LMP recorded.          Sexually active: {yes no:314532}  The current method of family planning is {contraception:315051}.    Exercising: {yes no:314532}  {types:19826} Smoker:  {YES NO:22349}  Health Maintenance: Pap:  07/22/16 Neg   07/08/16 HR HPV:neg   06/08/15 Neg. HR HPV:neg  History of abnormal Pap:  Yes, LEEP 2015 MMG: Never TDaP:  2017 Screening Labs: ***, Hb today: ***, Urine today: ***   reports that  has never smoked. she has never used smokeless tobacco. She reports that she drinks alcohol. She reports that she does not use drugs.  Past Medical History:  Diagnosis Date  . ADD (attention deficit disorder)   . History of chicken pox   . History of shingles     Past Surgical History:  Procedure Laterality Date  . LEEP N/A 06/30/2014   Procedure: LOOP ELECTROSURGICAL EXCISION PROCEDURE (LEEP);  Surgeon: Annamaria BootsMary Suzanne Miller, MD;  Location: [facility];  Service: Gynecology;  Laterality: N/A;  . WISDOM TOOTH EXTRACTION  2005    Current Outpatient Medications  Medication Sig Dispense Refill  . acetaminophen (TYLENOL) 500 MG tablet Take 1,000 mg by mouth every 6 (six) hours as needed for moderate pain or headache.    . fluticasone (FLONASE) 50 MCG/ACT nasal spray Place 2 sprays into both nostrils daily. 16 g 5  . mupirocin ointment (BACTROBAN) 2 %     . TRI-SPRINTEC 0.18/0.215/0.25 MG-35 MCG tablet TAKE 1 TABLET BY MOUTH DAILY 84 tablet 1  . valACYclovir (VALTREX) 1000 MG tablet TAKE 1 TABLET BY MOUTH DAILY (MAY INCREASE TO TWICE DAILY X3DAYS FOR OUTBREAKS) 90 tablet 1   No current facility-administered medications for this visit.     Family History  Problem Relation Age of Onset  . Hypertension Mother   . Hypertension Father   . Prostate cancer Father [phone]  . Melanoma Father [id number]  . Diabetes Maternal Grandmother   . Arthritis Maternal Grandmother   . Hypertension Maternal Grandmother   . Prostate cancer Maternal  Grandfather   . Heart disease Paternal Grandmother   . Other Brother [phone]       Batten Disease - gentic disorder  - attacks brain and nervous system     ROS:  Pertinent items are noted in HPI.  Otherwise, a comprehensive ROS was negative.  Exam:   There were no vitals taken for this visit.  Weight change: @WEIGHTCHANGE @ Height:      Ht Readings from Last 3 Encounters:  04/20/17 5\' 3"  (1.6 m)  01/16/17 5' 3.5" (1.613 m)  01/10/17 5' 3.6" (1.615 m)    General appearance: alert, cooperative and appears stated age Head: Normocephalic, without obvious abnormality, atraumatic Neck: no adenopathy, supple, symmetrical, trachea midline and thyroid {EXAM; THYROID:18604} Lungs: clear to auscultation bilaterally Breasts: {Exam; breast:13139::"normal appearance, no masses or tenderness"} Heart: regular rate and rhythm Abdomen: soft, non-tender; bowel sounds normal; no masses,  no organomegaly Extremities: extremities normal, atraumatic, no cyanosis or edema Skin: Skin color, texture, turgor normal. No rashes or lesions Lymph nodes: Cervical, supraclavicular, and axillary nodes normal. No abnormal inguinal nodes palpated Neurologic: Grossly normal   Pelvic: External genitalia:  no lesions              Urethra:  normal appearing urethra with no masses, tenderness or lesions              Bartholins and Skenes: normal  Vagina: normal appearing vagina with normal color and discharge, no lesions              Cervix: {exam; cervix:14595}              Pap taken: {yes no:314532} Bimanual Exam:  Uterus:  {exam; uterus:12215}              Adnexa: {exam; adnexa:12223}               Rectovaginal: Confirms               Anus:  normal sphincter tone, no lesions  Chaperone was present for exam.  A:  Well Woman with normal exam  P:   {plan; gyn:5269::"mammogram","pap smear","return annually or prn"}

## 2017-12-29 ENCOUNTER — Telehealth: Payer: Self-pay | Admitting: Obstetrics and Gynecology

## 2017-12-29 NOTE — Telephone Encounter (Signed)
Left patient a message to call back when ready to reschedule, canceled by automated reminder call. °

## 2018-01-02 ENCOUNTER — Ambulatory Visit: Payer: Self-pay | Admitting: Obstetrics and Gynecology

## 2018-01-08 ENCOUNTER — Other Ambulatory Visit: Payer: Self-pay | Admitting: Obstetrics & Gynecology

## 2018-01-08 NOTE — Telephone Encounter (Signed)
Medication refill request: OCP  Last AEX:  07-08-16  Next AEX: 01-17-18  Last MMG (if hormonal medication request): N/A Refill authorized: please advise

## 2018-01-17 ENCOUNTER — Ambulatory Visit: Payer: PRIVATE HEALTH INSURANCE | Admitting: Obstetrics & Gynecology

## 2018-01-17 ENCOUNTER — Other Ambulatory Visit: Payer: Self-pay

## 2018-01-17 ENCOUNTER — Other Ambulatory Visit (HOSPITAL_COMMUNITY)
Admission: RE | Admit: 2018-01-17 | Discharge: 2018-01-17 | Disposition: A | Discharge status: Home / Self Care | Payer: PRIVATE HEALTH INSURANCE | Source: Ambulatory Visit | Attending: Obstetrics & Gynecology | Admitting: Obstetrics & Gynecology

## 2018-01-17 ENCOUNTER — Encounter: Payer: Self-pay | Admitting: Obstetrics & Gynecology

## 2018-01-17 VITALS — BP 126/86 | HR 68 | Temp 97.5°F | Resp 16 | Ht 63.0 in | Wt 149.0 lb

## 2018-01-17 DIAGNOSIS — Z124 Encounter for screening for malignant neoplasm of cervix: Secondary | ICD-10-CM

## 2018-01-17 DIAGNOSIS — Z01419 Encounter for gynecological examination (general) (routine) without abnormal findings: Secondary | ICD-10-CM

## 2018-01-17 MED ORDER — NORGESTIM-ETH ESTRAD TRIPHASIC 0.18/0.215/0.25 MG-35 MCG PO TABS: 1.0000 | ORAL_TABLET | Freq: Every day | ORAL | 4 refills | Status: DC

## 2018-01-17 MED ORDER — DICLOFENAC POTASSIUM 25 MG PO CAPS: 25.0000 mg | ORAL_CAPSULE | Freq: Every day | ORAL | Status: DC

## 2018-01-17 MED ORDER — VALACYCLOVIR HCL 1 G PO TABS: 1000.0000 mg | ORAL_TABLET | Freq: Every day | ORAL | 4 refills | Status: DC

## 2018-01-17 NOTE — Progress Notes (Signed)
32 y.o. G0P0 MarriedCaucasianF here for annual exam.  Doing well.  Cycles are regular.  Flow lasts 4-5 days.    H/o CIN 3 with LEEP in 2015.    Continues to have small areas of tenderness on labia majora.  [facility].  No additional recommendations were made.  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  Saw Dr. Mauri PoleNeudleman.  Has decided at this time not to any surgery.  Has not done physical therapy.    Patient's last menstrual period was 01/02/2018.          Sexually active: Yes.    The current method of family planning is OCP (estrogen/progesterone).    Exercising: No.   Smoker:  no  Health Maintenance: Pap:  07/22/16 Neg  07/08/16 HR HPV:neg   06/08/15 Neg. HR HPV:neg  History of abnormal Pap:  Yes, LEEP CIN 3 MMG:  Never TDaP:  2017 Screening Labs: PCP   reports that  has never smoked. she has never used smokeless tobacco. She reports that she drinks alcohol. She reports that she does not use drugs.  Past Medical History:  Diagnosis Date  . ADD (attention deficit disorder)   . History of chicken pox   . History of shingles     Past Surgical History:  Procedure Laterality Date  . LEEP N/A 06/30/2014   Procedure: LOOP ELECTROSURGICAL EXCISION PROCEDURE (LEEP);  Surgeon: Annamaria BootsMary Suzanne Berda Shelvin, MD;  Location: [facility];  Service: Gynecology;  Laterality: N/A;  . WISDOM TOOTH EXTRACTION  2005    Current Outpatient Medications  Medication Sig Dispense Refill  . acetaminophen (TYLENOL) 500 MG tablet Take 1,000 mg by mouth every 6 (six) hours as needed for moderate pain or headache.    . fluticasone (FLONASE) 50 MCG/ACT nasal spray Place 2 sprays into both nostrils daily. 16 g 5  . Lurasidone HCl (LATUDA PO) Take by mouth.    . mupirocin ointment (BACTROBAN) 2 %     . TRI-SPRINTEC 0.18/0.215/0.25 MG-35 MCG tablet TAKE 1 TABLET BY MOUTH DAILY 1 Package 0  . valACYclovir (VALTREX) 1000 MG tablet TAKE 1 TABLET BY MOUTH DAILY (MAY INCREASE TO TWICE DAILY X3DAYS FOR OUTBREAKS) 90 tablet 1    No current facility-administered medications for this visit.     Family History  Problem Relation Age of Onset  . Hypertension Mother   . Hypertension Father   . Prostate cancer Father [id number]  . Melanoma Father [id number]  . Diabetes Maternal Grandmother   . Arthritis Maternal Grandmother   . Hypertension Maternal Grandmother   . Prostate cancer Maternal Grandfather   . Heart disease Paternal Grandmother   . Other Brother [phone]       Batten Disease - gentic disorder  - attacks brain and nervous system     ROS:  Pertinent items are noted in HPI.  Otherwise, a comprehensive ROS was negative.  Exam:   BP 126/86 (BP Location: Right Arm, Patient Position: Sitting, Cuff Size: Normal)   Pulse 68   Temp (!) 97.5 F (36.4 C) (Oral)   Resp 16   Ht 5\' 3"  (1.6 m)   Wt 149 lb (67.6 kg)   LMP 01/02/2018   BMI 26.39 kg/m    Height: 5\' 3"  (160 cm)  Ht Readings from Last 3 Encounters:  01/17/18 5\' 3"  (1.6 m)  04/20/17 5\' 3"  (1.6 m)  01/16/17 5' 3.5" (1.613 m)    General appearance: alert, cooperative and appears stated age Head: Normocephalic, without obvious abnormality, atraumatic  Neck: no adenopathy, supple, symmetrical, trachea midline and thyroid normal to inspection and palpation Lungs: clear to auscultation bilaterally Breasts: normal appearance, no masses or tenderness Heart: regular rate and rhythm Abdomen: soft, non-tender; bowel sounds normal; no masses,  no organomegaly Extremities: extremities normal, atraumatic, no cyanosis or edema Skin: Skin color, texture, turgor normal. No rashes or lesions, multiple tattoos present Lymph nodes: Cervical, supraclavicular, and axillary nodes normal. No abnormal inguinal nodes palpated Neurologic: Grossly normal   Pelvic: External genitalia:  no lesions              Urethra:  normal appearing urethra with no masses, tenderness or lesions              Bartholins and Skenes: normal                 Vagina: normal appearing vagina with normal  color and discharge, no lesions              Cervix: no lesions              Pap taken: Yes.   Bimanual Exam:  Uterus:  normal size, contour, position, consistency, mobility, non-tender              Adnexa: normal adnexa and no mass, fullness, tenderness               Rectovaginal: Confirms               Anus:  normal sphincter tone, no lesions  Chaperone was present for exam.  A:  Well Woman with normal exam History of genital HSV History of CIN-3 on LEEP 8/15.  Normal Pap since. 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  She did see specialist at [facility] and no additional recommendations were made.  P:   Mammogram guidelines reviewed.  She will plan to start a 40  Pap smear guidelines reviewed.  She does not feel comfortable skipping Pap today.  Pap only obtained.  No high risk HPV was obtained.  Refill for Valtrex 1 g daily, increasing to BID x 3 days with outbreaks.  #90/4 refills Refill for Tri-Sprintec, 3 mo supply/41yr RF Return annually or prn

## 2018-01-19 LAB — CYTOLOGY - PAP: Diagnosis: NEGATIVE

## 2018-04-20 ENCOUNTER — Telehealth: Payer: Self-pay | Admitting: Family Medicine

## 2018-04-20 ENCOUNTER — Other Ambulatory Visit: Payer: Managed Care, Other (non HMO)

## 2018-04-20 DIAGNOSIS — Z13220 Encounter for screening for lipoid disorders: Secondary | ICD-10-CM

## 2018-04-20 NOTE — Telephone Encounter (Signed)
-----   Message from Terri J Walsh sent at 04/09/2018 11:00 AM EDT ----- Regarding: Lab orders for Friday, 5.31.19 Patient is scheduled for CPX labs, please order future labs, Thanks , Terri  

## 2018-04-24 ENCOUNTER — Encounter: Payer: Managed Care, Other (non HMO) | Admitting: Family Medicine

## 2018-05-16 ENCOUNTER — Encounter: Payer: Self-pay | Admitting: Family Medicine

## 2018-05-16 ENCOUNTER — Ambulatory Visit (INDEPENDENT_AMBULATORY_CARE_PROVIDER_SITE_OTHER): Payer: PRIVATE HEALTH INSURANCE | Admitting: Family Medicine

## 2018-05-16 VITALS — BP 122/84 | HR 85 | Temp 98.0°F | Ht 63.0 in | Wt 148.5 lb

## 2018-05-16 DIAGNOSIS — J069 Acute upper respiratory infection, unspecified: Secondary | ICD-10-CM

## 2018-05-16 DIAGNOSIS — R1013 Epigastric pain: Secondary | ICD-10-CM | POA: Diagnosis not present

## 2018-05-16 NOTE — Progress Notes (Signed)
BP 122/84 (BP Location: Left Arm, Patient Position: Sitting, Cuff Size: Normal)   Pulse 85   Temp 98 F (36.7 C) (Oral)   Ht 5\' 3"  (1.6 m)   Wt 148 lb 8 oz (67.4 kg)   LMP 04/24/2018   SpO2 99%   BMI 26.31 kg/m    CC: cough, sinus pressure, epigastric abd pain Subjective:    Patient ID: Carly Suarez, female    DOB: 08/31/1986, 32 y.o.   MRN: [id number]  HPI: Carly Mutannie M Abdullah is a 32 y.o. female presenting on 05/16/2018 for Cough (Dry cough. ); Abdominal Pain (Located in epigastric area. Started 05/14/18. Denies any N/V/D. ); and Sinus Problem (C/o sinus pressure. )   10d h/o ST, flu like symptoms with body aches and fever (Tmax 100). Initially saw teledoc - treated with naprosyn. [facility] week - treated with cough medicine (brompheniramine/pseduophed), zpack antibiotic and prednisone course. ST has improved, remaining somewhat congested with mildly productive cough. Monday abd started hurting. Sore R upper teeth. Ongoing R sided headache, muffled hearing. Mild PNDrainage. Epigastric pain described as cramping tightness. No boring pain to back.   No fevers/chills, ear pain, nausea/vomiting, diarrhea or bowel changes. No GERD.  No sick contacts at home.  No h/o asthma. Non smoker  Relevant past medical, surgical, family and social history reviewed and updated as indicated. Interim medical history since our last visit reviewed. Allergies and medications reviewed and updated. Outpatient Medications Prior to Visit  Medication Sig Dispense Refill  . acetaminophen (TYLENOL) 500 MG tablet Take 1,000 mg by mouth every 6 (six) hours as needed for moderate pain or headache.    . Diclofenac Potassium 25 MG CAPS Take 1 capsule (25 mg total) by mouth daily. 120 capsule   . fluticasone (FLONASE) 50 MCG/ACT nasal spray Place 2 sprays into both nostrils daily. 16 g 5  . Lurasidone HCl (LATUDA PO) Take by mouth.    . mupirocin ointment (BACTROBAN) 2 %     . Norgestimate-Ethinyl Estradiol  Triphasic (TRI-SPRINTEC) 0.18/0.215/0.25 MG-35 MCG tablet Take 1 tablet by mouth daily. 3 Package 4  . valACYclovir (VALTREX) 1000 MG tablet Take 1 tablet (1,000 mg total) by mouth daily. 90 tablet 4   No facility-administered medications prior to visit.      Per HPI unless specifically indicated in ROS section below Review of Systems     Objective:    BP 122/84 (BP Location: Left Arm, Patient Position: Sitting, Cuff Size: Normal)   Pulse 85   Temp 98 F (36.7 C) (Oral)   Ht 5\' 3"  (1.6 m)   Wt 148 lb 8 oz (67.4 kg)   LMP 04/24/2018   SpO2 99%   BMI 26.31 kg/m   Wt Readings from Last 3 Encounters:  05/16/18 148 lb 8 oz (67.4 kg)  01/17/18 149 lb (67.6 kg)  04/20/17 146 lb 12 oz (66.6 kg)    Physical Exam  Constitutional: She appears well-developed and well-nourished. No distress.  HENT:  Head: Normocephalic and atraumatic.  Right Ear: Hearing, tympanic membrane, external ear and ear canal normal.  Left Ear: Hearing, tympanic membrane, external ear and ear canal normal.  Nose: Mucosal edema (L>R nasal erythema and discharge) present. No rhinorrhea. Right sinus exhibits no maxillary sinus tenderness and no frontal sinus tenderness. Left sinus exhibits no maxillary sinus tenderness and no frontal sinus tenderness.  Mouth/Throat: Uvula is midline, oropharynx is clear and moist and mucous membranes are normal. No oropharyngeal exudate, posterior  oropharyngeal edema, posterior oropharyngeal erythema or tonsillar abscesses.  Eyes: Pupils are equal, round, and reactive to light. Conjunctivae and EOM are normal. No scleral icterus.  Neck: Normal range of motion. Neck supple.  Cardiovascular: Normal rate, regular rhythm, normal heart sounds and intact distal pulses.  No murmur heard. Pulmonary/Chest: Effort normal and breath sounds normal. No respiratory distress. She has no wheezes. She has no rales.  Abdominal: Normal appearance and bowel sounds are normal. There is no  hepatosplenomegaly. There is tenderness (mild-mod discomfort) in the epigastric area. There is no rebound, no guarding and no CVA tenderness. No hernia.  Lymphadenopathy:    She has no cervical adenopathy.  Skin: Skin is warm and dry. No rash noted.  Nursing note and vitals reviewed.     Assessment & Plan:   Problem List Items Addressed This Visit    Upper respiratory infection, acute - Primary    Recently treated for URI with NSAID, prednisone, cough syrup and zpack by urgent care eval x3. Slow recovery. Now with right sided symptoms may be developing R sided sinusitis - on day #3 of zpack. Advised finish course and monitor for continued improvement. If symptoms not resolving over the rest of the week, or if worsening symptoms, pt to contact us to consider further treatment for acute sinusitis.       Epigastric abdominal pain    Anticipate medicine related gastritis or similar - due to recent NSAID and prednisone course. Slowly improving - discussed if persistent symptoms to take OTC zantac or prilosec x1-2 wks, update if persistent or ongoing symptoms.           No orders of the defined types were placed in this encounter.  No orders of the defined types were placed in this encounter.   Follow up plan: Return if symptoms worsen or fail to improve.  Eustaquio Boyden, MD

## 2018-05-16 NOTE — Assessment & Plan Note (Signed)
Recently treated for URI with NSAID, prednisone, cough syrup and zpack by urgent care eval x3. Slow recovery. Now with right sided symptoms may be developing R sided sinusitis - on day #3 of zpack. Advised finish course and monitor for continued improvement. If symptoms not resolving over the rest of the week, or if worsening symptoms, pt to contact us to consider further treatment for acute sinusitis.

## 2018-05-16 NOTE — Assessment & Plan Note (Signed)
Anticipate medicine related gastritis or similar - due to recent NSAID and prednisone course. Slowly improving - discussed if persistent symptoms to take OTC zantac or prilosec x1-2 wks, update if persistent or ongoing symptoms.

## 2018-05-16 NOTE — Patient Instructions (Signed)
I think stomach symptoms are likely from recent medicines (prednisone and naprosyn) causing irritation of stomach lining. If ongoing abdominal discomfort, pick up over the counter zantac or prilosec OTC for a week or two.  For right sided head symptoms - possible sinus infection - zpack should help treat this. Let us know if ongoing symptoms after finishing zpack or new fevers, worsening right facial pain or sinus congestion.

## 2018-07-29 ENCOUNTER — Telehealth: Payer: Self-pay | Admitting: Family Medicine

## 2018-07-29 DIAGNOSIS — Z13220 Encounter for screening for lipoid disorders: Secondary | ICD-10-CM

## 2018-07-29 NOTE — Telephone Encounter (Signed)
-----   Message from Wendi Maya, RT sent at 07/25/2018  6:11 PM EDT ----- Regarding: Lab orders for Wednesday 08/01/18 Please enter CPE lab orders for 08/01/18. Thanks!

## 2018-08-01 ENCOUNTER — Other Ambulatory Visit (INDEPENDENT_AMBULATORY_CARE_PROVIDER_SITE_OTHER): Payer: PRIVATE HEALTH INSURANCE

## 2018-08-01 DIAGNOSIS — Z13220 Encounter for screening for lipoid disorders: Secondary | ICD-10-CM

## 2018-08-01 LAB — LIPID PANEL
Cholesterol: 166 mg/dL (ref 0–200)
HDL: 72.5 mg/dL (ref >39.00)
LDL Cholesterol: 72 mg/dL (ref 0–99)
NonHDL: 93.63
Total CHOL/HDL Ratio: 2
Triglycerides: 107 mg/dL (ref 0.0–149.0)
VLDL: 21.4 mg/dL (ref 0.0–40.0)

## 2018-08-01 LAB — COMPREHENSIVE METABOLIC PANEL
ALBUMIN: 4 g/dL (ref 3.5–5.2)
ALK PHOS: 39 U/L (ref 39–117)
ALT: 17 U/L (ref 0–35)
AST: 15 U/L (ref 0–37)
BUN: 12 mg/dL (ref 6–23)
CO2: 27 mEq/L (ref 19–32)
Calcium: 9.5 mg/dL (ref 8.4–10.5)
Chloride: 103 mEq/L (ref 96–112)
Creatinine, Ser: 0.69 mg/dL (ref 0.40–1.20)
GFR: 104.51 mL/min (ref >60.00)
Glucose, Bld: 86 mg/dL (ref 70–99)
POTASSIUM: 4.2 meq/L (ref 3.5–5.1)
Sodium: 137 mEq/L (ref 135–145)
TOTAL PROTEIN: 6.7 g/dL (ref 6.0–8.3)
Total Bilirubin: 0.5 mg/dL (ref 0.2–1.2)

## 2018-08-07 ENCOUNTER — Encounter: Payer: Self-pay | Admitting: Family Medicine

## 2018-08-07 ENCOUNTER — Ambulatory Visit (INDEPENDENT_AMBULATORY_CARE_PROVIDER_SITE_OTHER): Payer: PRIVATE HEALTH INSURANCE | Admitting: Family Medicine

## 2018-08-07 VITALS — BP 110/70 | HR 69 | Temp 98.3°F | Ht 62.75 in | Wt 151.5 lb

## 2018-08-07 DIAGNOSIS — Z0000 Encounter for general adult medical examination without abnormal findings: Secondary | ICD-10-CM

## 2018-08-07 DIAGNOSIS — M7741 Metatarsalgia, right foot: Secondary | ICD-10-CM | POA: Diagnosis not present

## 2018-08-07 DIAGNOSIS — F321 Major depressive disorder, single episode, moderate: Secondary | ICD-10-CM

## 2018-08-07 NOTE — Assessment & Plan Note (Signed)
Tolerable control on no medication. Pt refuses med and psychiatrist.  Will continue healthy lifestyle  To treat.

## 2018-08-07 NOTE — Progress Notes (Signed)
Subjective:    Patient ID: Carly Suarez, female    DOB: 1986/09/05, 32 y.o.   MRN: [id number]  HPI The patient is here for annual wellness exam and preventative care.    She reports she is doing well over all.   She has had some pain in ball of right foot. Sore when on foot or at rest. No redness.  No callus.  No known injury or fall.  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 Wears flip flops a lot but at work wears boots.  Has not taken ibuprofen for it.  MDD, GAD  Moderate control. No longer seeing psychiatry, no longer on medication.  Regular massages, yoga, healthy lifestyle.Marland Kitchen Helps tolerable.   Reviewed labs in detail with pt. Lab Results  Component Value Date   CHOL 166 08/01/2018   HDL 72.50 08/01/2018   LDLCALC 72 08/01/2018   TRIG 107.0 08/01/2018   CHOLHDL 2 08/01/2018   Exercise: YOGA one to two times a week  Diet: good, varied diet.   Wt Readings from Last 3 Encounters:  08/07/18 151 lb 8 oz (68.7 kg)  05/16/18 148 lb 8 oz (67.4 kg)  01/17/18 149 lb (67.6 kg)    Body mass index is 27.05 kg/m.  Social History /Family History/Past Medical History reviewed in detail and updated in EMR if needed. Blood pressure 110/70, pulse 69, temperature 98.3 F (36.8 C), temperature source Oral, height 5' 2.75" (1.594 m), weight 151 lb 8 oz (68.7 kg), last menstrual period 07/10/2018.  Review of Systems  Constitutional: Negative for fatigue and fever.  HENT: Negative for congestion.   Eyes: Negative for pain.  Respiratory: Negative for cough and shortness of breath.   Cardiovascular: Negative for chest pain, palpitations and leg swelling.  Gastrointestinal: Negative for abdominal pain.  Genitourinary: Negative for dysuria and vaginal bleeding.  Musculoskeletal: Negative for back pain.  Neurological: Negative for syncope, light-headedness and headaches.  Psychiatric/Behavioral: Negative for dysphoric mood.       Objective:   Physical Exam  Constitutional: Vital signs  are normal. She appears well-developed and well-nourished. She is cooperative.  Non-toxic appearance. She does not appear ill. No distress.  HENT:  Head: Normocephalic.  Right Ear: Hearing, tympanic membrane, external ear and ear canal normal.  Left Ear: Hearing, tympanic membrane, external ear and ear canal normal.  Nose: Nose normal.  Eyes: Pupils are equal, round, and reactive to light. Conjunctivae, EOM and lids are normal. Lids are everted and swept, no foreign bodies found.  Neck: Trachea normal and normal range of motion. Neck supple. Carotid bruit is not present. No thyroid mass and no thyromegaly present.  Cardiovascular: Normal rate, regular rhythm, S1 normal, S2 normal, normal heart sounds and intact distal pulses. Exam reveals no gallop.  No murmur heard. Pulmonary/Chest: Effort normal and breath sounds normal. No respiratory distress. She has no wheezes. She has no rhonchi. She has no rales.  Abdominal: Soft. Normal appearance and bowel sounds are normal. She exhibits no distension, no fluid wave, no abdominal bruit and no mass. There is no hepatosplenomegaly. There is no tenderness. There is no rebound, no guarding and no CVA tenderness. No hernia.  Lymphadenopathy:    She has no cervical adenopathy.    She has no axillary adenopathy.  Neurological: She is alert. She has normal strength. No cranial nerve deficit or sensory deficit.  Skin: Skin is warm, dry and intact. No rash noted.  Psychiatric: Her speech is normal and behavior is normal.  Judgment normal. Her mood appears not anxious. Cognition and memory are normal. She does not exhibit a depressed mood.   Very ttp over right 1st metatarsal head.       Assessment & Plan:  The patient's preventative maintenance and recommended screening tests for an annual wellness exam were reviewed in full today. Brought up to date unless services declined.  Counselled on the importance of diet, exercise, and its role in overall health  and mortality. The patient's FH and SH was reviewed, including their home life, tobacco status, and drug and alcohol status.   Vaccines: Tdap uptodate 2017, has received HPV x 3, refused flu vaccine plans to get at work. PAP/Breast: per GYN.. Last appt in last year Colon: no early family history. Nonsmoker STD testing: refused

## 2018-08-07 NOTE — Assessment & Plan Note (Addendum)
NSAID and metatarsal pad trial.   if not improving consider X-ray to rule out stress fracture of metatarsal head.

## 2018-08-07 NOTE — Patient Instructions (Signed)
Diclofenac 75 mgf twice daily or ibuprofen 800 mg every 8 hours for pain and inflammation.  Wear more supportive shoes. Trial of metatarsal pads in shoes.  If not improving follow up for X-ray or see podiatry.

## 2019-01-28 ENCOUNTER — Other Ambulatory Visit (HOSPITAL_COMMUNITY)
Admission: RE | Admit: 2019-01-28 | Discharge: 2019-01-28 | Disposition: A | Discharge status: Home / Self Care | Payer: PRIVATE HEALTH INSURANCE | Source: Ambulatory Visit | Attending: Obstetrics & Gynecology | Admitting: Obstetrics & Gynecology

## 2019-01-28 ENCOUNTER — Encounter: Payer: Self-pay | Admitting: Obstetrics & Gynecology

## 2019-01-28 ENCOUNTER — Other Ambulatory Visit: Payer: Self-pay

## 2019-01-28 ENCOUNTER — Ambulatory Visit (INDEPENDENT_AMBULATORY_CARE_PROVIDER_SITE_OTHER): Payer: PRIVATE HEALTH INSURANCE | Admitting: Obstetrics & Gynecology

## 2019-01-28 ENCOUNTER — Encounter: Payer: Self-pay | Admitting: Internal Medicine

## 2019-01-28 VITALS — BP 136/82 | HR 74 | Resp 14 | Ht 63.0 in | Wt 153.0 lb

## 2019-01-28 DIAGNOSIS — R1013 Epigastric pain: Secondary | ICD-10-CM

## 2019-01-28 DIAGNOSIS — Z01419 Encounter for gynecological examination (general) (routine) without abnormal findings: Secondary | ICD-10-CM

## 2019-01-28 DIAGNOSIS — Z124 Encounter for screening for malignant neoplasm of cervix: Secondary | ICD-10-CM | POA: Diagnosis not present

## 2019-01-28 DIAGNOSIS — N764 Abscess of vulva: Secondary | ICD-10-CM | POA: Diagnosis not present

## 2019-01-28 MED ORDER — VALACYCLOVIR HCL 1 G PO TABS: 1000.0000 mg | ORAL_TABLET | Freq: Every day | ORAL | 4 refills | Status: DC

## 2019-01-28 MED ORDER — NORGESTIM-ETH ESTRAD TRIPHASIC 0.18/0.215/0.25 MG-35 MCG PO TABS: 1.0000 | ORAL_TABLET | Freq: Every day | ORAL | 4 refills | Status: DC

## 2019-01-28 NOTE — Progress Notes (Signed)
33 y.o. G0P0 Married White or Caucasian female here for annual exam.  Reports she's had three episodes of pain that starts in her epigastric region.  Has tight pressure when this occurs that is associated with nausea.  The most recent episode started Satruday night around 8pm and lasted until midday yesterday.  It does feel like there are food triggers.  Alcohol and wine makes this worse.  Has taken tums and prilosec prn.  No emesis.  No diarrhea or constipation. Would like to see Dr. Leone Payor who helped care for her brother    Cycles are regular.  On OCPs.  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 These are small but can be very painful.  I did culture the skin and treat with antibiotics.  She changes products she was using for cleaning.  She did go to [facility] for consultation that she felt was not helpful at all.  Would like other suggestions.  As I just don't have any other ideas, feel derm consultation is appropriate.  Will refer to [facility].  Pt is going to take pictures of next occurrence to take to appt.    LMP:  01/02/2019      Sexually active: Yes.    The current method of family planning is OCP (estrogen/progesterone).    Exercising: Yes.    Gym/ health club routine includes yoga. Smoker:  no  Health Maintenance: Pap:  01/17/18 Neg   07/22/16 Neg. 07/08/16 HR HPV:neg  History of abnormal Pap:  Yes, LEEP CIN 3 2015 TDaP:  2017 Screening Labs: PCP   reports that she has never smoked. She has never used smokeless tobacco. She reports current alcohol use. She reports that she does not use drugs.  Past Medical History:  Diagnosis Date  . ADD (attention deficit disorder)   . History of chicken pox   . History of shingles     Past Surgical History:  Procedure Laterality Date  . LEEP N/A 06/30/2014   Procedure: LOOP ELECTROSURGICAL EXCISION PROCEDURE (LEEP);  Surgeon: Annamaria Boots, MD;  Location: [facility];  Service: Gynecology;  Laterality: N/A;  . WISDOM TOOTH  EXTRACTION  2005    Current Outpatient Medications  Medication Sig Dispense Refill  . acetaminophen (TYLENOL) 500 MG tablet Take 1,000 mg by mouth every 6 (six) hours as needed for moderate pain or headache.    . Diclofenac Potassium 25 MG CAPS Take 1 capsule (25 mg total) by mouth daily. 120 capsule   . fluticasone (FLONASE) 50 MCG/ACT nasal spray Place 2 sprays into both nostrils daily. 16 g 5  . mupirocin ointment (BACTROBAN) 2 %     . Norgestimate-Ethinyl Estradiol Triphasic (TRI-SPRINTEC) 0.18/0.215/0.25 MG-35 MCG tablet Take 1 tablet by mouth daily. 3 Package 4  . valACYclovir (VALTREX) 1000 MG tablet Take 1 tablet (1,000 mg total) by mouth daily. 90 tablet 4   No current facility-administered medications for this visit.     Family History  Problem Relation Age of Onset  . Hypertension Mother   . Hypertension Father   . Prostate cancer Father 70  . Melanoma Father 33  . Diabetes Maternal Grandmother   . Arthritis Maternal Grandmother   . Hypertension Maternal Grandmother   . Prostate cancer Maternal Grandfather   . Heart disease Paternal Grandmother   . 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   Exam:   Vitals:   01/28/19 1222  BP: 136/82  Pulse: 74  Resp: 14    General appearance: alert, cooperative and appears stated age Head: Normocephalic, without obvious abnormality, atraumatic Neck: no adenopathy, supple, symmetrical, trachea midline and thyroid normal to inspection and palpation Lungs: clear to auscultation bilaterally Breasts: normal appearance, no masses or tenderness Heart: regular rate and rhythm Abdomen: soft, non-tender; bowel sounds normal; no masses,  no organomegaly Extremities: extremities normal, atraumatic, no cyanosis or edema Skin: Skin color, texture, turgor normal. No  rashes or lesions Lymph nodes: Cervical, supraclavicular, and axillary nodes normal. No abnormal inguinal nodes palpated Neurologic: Grossly normal   Pelvic: External genitalia:  no lesions              Urethra:  normal appearing urethra with no masses, tenderness or lesions              Bartholins and Skenes: normal                 Vagina: normal appearing vagina with normal color and discharge, no lesions              Cervix: no lesions              Pap taken: No. Bimanual Exam:  Uterus:  normal size, contour, position, consistency, mobility, non-tender              Adnexa: normal adnexa and no mass, fullness, tenderness               Rectovaginal: Confirms               Anus:  normal sphincter tone, no lesions  Chaperone was present for exam.  A:  Well Woman with normal exam H/o vulvar HSV H/O CIN 3 treated with LEEP 8/15.  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  Will start at 40 at this point. pap smear obtained today.  She desires yearly pap smear.  Neg HR HPV 8/17 RF for valtrex 1 gram daily increasing to BID x 3 days with outbreaks.  #90/4RF RF for tri-sprintec daily.  #90 day supply/4RF Referral to GI Referral to Dr. Evelina Dun at [facility] annually or prn

## 2019-01-31 ENCOUNTER — Encounter: Payer: Self-pay | Admitting: Obstetrics & Gynecology

## 2019-01-31 LAB — CYTOLOGY - PAP: Diagnosis: NEGATIVE

## 2019-02-25 ENCOUNTER — Other Ambulatory Visit: Payer: Self-pay

## 2019-02-25 ENCOUNTER — Ambulatory Visit (INDEPENDENT_AMBULATORY_CARE_PROVIDER_SITE_OTHER): Payer: PRIVATE HEALTH INSURANCE | Admitting: Internal Medicine

## 2019-02-25 ENCOUNTER — Encounter: Payer: Self-pay | Admitting: Internal Medicine

## 2019-02-25 DIAGNOSIS — R1013 Epigastric pain: Secondary | ICD-10-CM | POA: Diagnosis not present

## 2019-02-25 NOTE — Assessment & Plan Note (Addendum)
Symptoms sound like cholelithiasis to me. She is to go on low fat diet - we will send her that and gallstones handout in mail. Gastritis, esophageal dysmotility, GERD also possible.  If she gets another attack - if severe enough go to ED If tolerates at home she will let me know and will get labs - LFT.s lipase, amylase and CBC Will evaluate with RUQ [place] - to be scheduled Reviewed signs and symptoms that would mean an ED visit - fever, unremitting/severe symptoms

## 2019-02-25 NOTE — Patient Instructions (Addendum)
As we discussed I think most likely diagnosis is gallstones.  If you get another attack let me know - we will do labs soon as possible around that time.  If you have a bad episode that involves fever, nausea and vomiting that won't stop or the symptoms are severe and last longer than 4-6 hours you should go to the emergency department.  We are sending you information about low fat diet and gallstones.  [facility] will contact you to schedule your abdominal ultrasound.  I appreciate the opportunity to care for you. Iva Boop, MD, [facility]

## 2019-02-25 NOTE — Progress Notes (Signed)
    TELEHEALTH ENCOUNTER IN SETTING OF COVID-19 PANDEMIC - REQUESTED BY PATIENT SERVICE PROVIDED BY TELEMEDECINE - TYPE: Webex PATIENT LOCATION: Work PATIENT HAS CONSENTED TO TELEHEALTH VISIT PROVIDER LOCATION: OFFICE REFERRING PROVIDER dr. Leda Quail  Carly Suarez 33 y.o. Mar 05, 1986 [id number]  Assessment & Plan:   Abdominal pain, epigastric - episodic, ? gallstones Symptoms sound like cholelithiasis to me. She is to go on low fat diet - we will send her that and gallstones handout in mail. Gastritis, esophageal dysmotility, GERD also possible.  If she gets another attack - if severe enough go to ED If tolerates at home she will let me know and will get labs - LFT.s lipase, amylase and CBC Will evaluate with RUQ [place] - to be scheduled Reviewed signs and symptoms that would mean an ED visit - fever, unremitting/severe symptoms  Cc: Dr. Mia Creek, MD  Subjective:   Chief Complaint: epigastric pain  HPI The patient is seen via telehealth - she has a history of intermittent epigastric pain. First episodes she recalls were about 2 years ago - says pain in epigastrium w/o radiation lasting hours. Did ok and then more recently a few episodes, usually at night. Says occurred after eating pizza and drinking wine. Most severe episode lasted 8-10 hours. Antacids and PPI did not provide rapid relief, symptoms eventually subsides. No dysphagia, no frequent heartburn. Maybe occasional mild sensation or symptoms. Has not occurred again x 1 month. No vomiting reported. No icterus or dark urine. No fevers. NL LFT 2019  Wt Readings from Last 3 Encounters:  02/25/19 153 lb (69.4 kg)  01/28/19 153 lb (69.4 kg)  08/07/18 151 lb 8 oz (68.7 kg)    No Known Allergies Current Meds  Medication Sig  . fluticasone (FLONASE) 50 MCG/ACT nasal spray Place 2 sprays into both nostrils daily.  . Norgestimate-Ethinyl Estradiol Triphasic (TRI-SPRINTEC) 0.18/0.215/0.25 MG-35 MCG tablet  Take 1 tablet by mouth daily.  . valACYclovir (VALTREX) 1000 MG tablet Take 1 tablet (1,000 mg total) by mouth daily.   Past Medical History:  Diagnosis Date  . ADD (attention deficit disorder)   . History of chicken pox   . History of shingles    Past Surgical History:  Procedure Laterality Date  . LEEP N/A 06/30/2014   Procedure: LOOP ELECTROSURGICAL EXCISION PROCEDURE (LEEP);  Surgeon: Annamaria Boots, MD;  Location: [facility];  Service: Gynecology;  Laterality: N/A;  . WISDOM TOOTH EXTRACTION  2005   Social History   Social History Narrative   Married, no children.   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   Review of Systems\ Labial minora abscesses intermittently  Data Review:  GYN and PCP notes 2019-2020 Labs in EMR All other ROS negative

## 2019-03-04 ENCOUNTER — Other Ambulatory Visit: Payer: Self-pay

## 2019-03-04 ENCOUNTER — Ambulatory Visit
Admission: RE | Admit: 2019-03-04 | Discharge: 2019-03-04 | Disposition: A | Discharge status: Home / Self Care | Payer: PRIVATE HEALTH INSURANCE | Source: Ambulatory Visit | Attending: Internal Medicine | Admitting: Internal Medicine

## 2019-03-04 DIAGNOSIS — R1013 Epigastric pain: Secondary | ICD-10-CM

## 2019-03-06 NOTE — Progress Notes (Signed)
Please let her know she does not have gallstones. As long as she has not had more episodes I would observe for now.  If she has been having more pains let me know and if it happens again let me know.

## 2019-05-13 DIAGNOSIS — N907 Vulvar cyst: Secondary | ICD-10-CM | POA: Insufficient documentation

## 2019-05-14 ENCOUNTER — Telehealth: Payer: Self-pay

## 2019-05-14 ENCOUNTER — Other Ambulatory Visit: Payer: PRIVATE HEALTH INSURANCE

## 2019-05-14 DIAGNOSIS — Z20822 Contact with and (suspected) exposure to covid-19: Secondary | ICD-10-CM

## 2019-05-14 NOTE — Telephone Encounter (Signed)
Incoming call from [facility], referring Pt.  For Covid -19 testing.   Telephone  call to Pt. Patient Scheduled for later today at 3:45pm.  Pt.  Voices Understanding.

## 2019-05-18 LAB — NOVEL CORONAVIRUS, NAA: SARS-CoV-2, NAA: NOT DETECTED

## 2019-08-05 ENCOUNTER — Ambulatory Visit: Payer: PRIVATE HEALTH INSURANCE | Admitting: Family Medicine

## 2019-10-01 ENCOUNTER — Telehealth: Payer: Self-pay | Admitting: Family Medicine

## 2019-10-01 ENCOUNTER — Other Ambulatory Visit (INDEPENDENT_AMBULATORY_CARE_PROVIDER_SITE_OTHER): Payer: PRIVATE HEALTH INSURANCE

## 2019-10-01 DIAGNOSIS — Z13220 Encounter for screening for lipoid disorders: Secondary | ICD-10-CM

## 2019-10-01 LAB — COMPREHENSIVE METABOLIC PANEL
ALT: 16 U/L (ref 0–35)
AST: 16 U/L (ref 0–37)
Albumin: 4.3 g/dL (ref 3.5–5.2)
Alkaline Phosphatase: 47 U/L (ref 39–117)
BUN: 12 mg/dL (ref 6–23)
CO2: 25 mEq/L (ref 19–32)
Calcium: 9.4 mg/dL (ref 8.4–10.5)
Chloride: 102 mEq/L (ref 96–112)
Creatinine, Ser: 0.62 mg/dL (ref 0.40–1.20)
GFR: 110.45 mL/min (ref >60.00)
Glucose, Bld: 78 mg/dL (ref 70–99)
Potassium: 4.1 mEq/L (ref 3.5–5.1)
Sodium: 135 mEq/L (ref 135–145)
Total Bilirubin: 0.5 mg/dL (ref 0.2–1.2)
Total Protein: 7 g/dL (ref 6.0–8.3)

## 2019-10-01 LAB — LIPID PANEL
Cholesterol: 208 mg/dL — ABNORMAL HIGH (ref 0–200)
HDL: 71 mg/dL (ref >39.00)
LDL Cholesterol: 109 mg/dL — ABNORMAL HIGH (ref 0–99)
NonHDL: 137.21
Total CHOL/HDL Ratio: 3
Triglycerides: 142 mg/dL (ref 0.0–149.0)
VLDL: 28.4 mg/dL (ref 0.0–40.0)

## 2019-10-01 NOTE — Telephone Encounter (Signed)
-----   Message from Terri J Walsh sent at 09/25/2019 12:51 PM EST ----- Regarding: lab orders for Tuesday, 11.10.20 Patient is scheduled for CPX labs, please order future labs, Thanks , Terri  

## 2019-10-01 NOTE — Progress Notes (Signed)
No critical labs need to be addressed urgently. We will discuss labs in detail at upcoming office visit.   

## 2019-10-04 ENCOUNTER — Other Ambulatory Visit: Payer: Self-pay

## 2019-10-04 ENCOUNTER — Ambulatory Visit (INDEPENDENT_AMBULATORY_CARE_PROVIDER_SITE_OTHER): Payer: PRIVATE HEALTH INSURANCE | Admitting: Family Medicine

## 2019-10-04 ENCOUNTER — Encounter: Payer: Self-pay | Admitting: Family Medicine

## 2019-10-04 VITALS — BP 136/86 | HR 66 | Temp 98.5°F | Ht 63.0 in | Wt 159.0 lb

## 2019-10-04 DIAGNOSIS — Z23 Encounter for immunization: Secondary | ICD-10-CM | POA: Diagnosis not present

## 2019-10-04 DIAGNOSIS — Z0000 Encounter for general adult medical examination without abnormal findings: Secondary | ICD-10-CM

## 2019-10-04 DIAGNOSIS — F321 Major depressive disorder, single episode, moderate: Secondary | ICD-10-CM | POA: Diagnosis not present

## 2019-10-04 NOTE — Assessment & Plan Note (Signed)
Tolerable control per pt.. discussed medication treatment options for GAD .Marland Kitchen she will consider.

## 2019-10-04 NOTE — Addendum Note (Signed)
Addended by: Pilar Grammes on: 10/04/2019 03:17 PM   Modules accepted: Orders

## 2019-10-04 NOTE — Progress Notes (Signed)
Chief Complaint  Patient presents with  . Annual Exam    History of Present Illness: HPI  The patient is here for annual wellness exam and preventative care.    Doing well overall.   Diet:  Moderate Exercise:walking  MDD, GAD  Stable on no medication.    Office Visit from 10/04/2019 in [place] at [facility] Total Score  2    More anxiety then depression. Episodes of anxiety ( nervous , shaky, taerful) in social situations. No issue sleeping, falling asleep. No SI, no HI.  COVID 19 screen No recent travel or known exposure to COVID19 The patient denies respiratory symptoms of COVID 19 at this time.  The importance of social distancing was discussed today.   Review of Systems  Constitutional: Negative for chills and fever.  HENT: Negative for congestion and ear pain.   Eyes: Negative for pain and redness.  Respiratory: Negative for cough and shortness of breath.   Cardiovascular: Negative for chest pain, palpitations and leg swelling.  Gastrointestinal: Negative for abdominal pain, blood in stool, constipation, diarrhea, nausea and vomiting.  Genitourinary: Negative for dysuria.  Musculoskeletal: Negative for falls and myalgias.  Skin: Negative for rash.  Neurological: Negative for dizziness.  Psychiatric/Behavioral: Negative for depression. The patient is not nervous/anxious.       Past Medical History:  Diagnosis Date  . ADD (attention deficit disorder)   . History of chicken pox   . History of shingles     reports that she has never smoked. She has never used smokeless tobacco. She reports current alcohol use. She reports that she does not use drugs.   Current Outpatient Medications:  .  fluticasone (FLONASE) 50 MCG/ACT nasal spray, Place 2 sprays into both nostrils daily., Disp: 16 g, Rfl: 5 .  Norgestimate-Ethinyl Estradiol Triphasic (TRI-SPRINTEC) 0.18/0.215/0.25 MG-35 MCG tablet, Take 1 tablet by mouth daily., Disp: 3 Package, Rfl: 4 .   valACYclovir (VALTREX) 1000 MG tablet, Take 1 tablet (1,000 mg total) by mouth daily., Disp: 90 tablet, Rfl: 4   Observations/Objective: Blood pressure 136/86, pulse 66, temperature 98.5 F (36.9 C), height 5\' 3"  (1.6 m), weight 159 lb (72.1 kg), SpO2 99 %.  Physical Exam Constitutional:      General: She is not in acute distress.    Appearance: Normal appearance. She is well-developed. She is not ill-appearing or toxic-appearing.  HENT:     Head: Normocephalic.     Right Ear: Hearing, tympanic membrane, ear canal and external ear normal.     Left Ear: Hearing, tympanic membrane, ear canal and external ear normal.     Nose: Nose normal.  Eyes:     General: Lids are normal. Lids are everted, no foreign bodies appreciated.     Conjunctiva/sclera: Conjunctivae normal.     Pupils: Pupils are equal, round, and reactive to light.  Neck:     Musculoskeletal: Normal range of motion and neck supple.     Thyroid: No thyroid mass or thyromegaly.     Vascular: No carotid bruit.     Trachea: Trachea normal.  Cardiovascular:     Rate and Rhythm: Normal rate and regular rhythm.     Heart sounds: Normal heart sounds, S1 normal and S2 normal. No murmur. No gallop.   Pulmonary:     Effort: Pulmonary effort is normal. No respiratory distress.     Breath sounds: Normal breath sounds. No wheezing, rhonchi or rales.  Abdominal:     General: Bowel sounds are  normal. There is no distension or abdominal bruit.     Palpations: Abdomen is soft. There is no fluid wave or mass.     Tenderness: There is no abdominal tenderness. There is no guarding or rebound.     Hernia: No hernia is present.  Lymphadenopathy:     Cervical: No cervical adenopathy.  Skin:    General: Skin is warm and dry.     Findings: No rash.  Neurological:     Mental Status: She is alert.     Cranial Nerves: No cranial nerve deficit.     Sensory: No sensory deficit.  Psychiatric:        Mood and Affect: Mood is not anxious or  depressed.        Speech: Speech normal.        Behavior: Behavior normal. Behavior is cooperative.        Judgment: Judgment normal.      Assessment and Plan   The patient's preventative maintenance and recommended screening tests for an annual wellness exam were reviewed in full today. Brought up to date unless services declined.  Counselled on the importance of diet, exercise, and its role in overall health and mortality. The patient's FH and SH was reviewed, including their home life, tobacco status, and drug and alcohol status.   Vaccines: Tdapuptodate 2017, has received HPV x 3. PAP/Breast: per GYN.. Last appt in last year, no early family history of breast x cancer Colon: no early family history. Nonsmoker STD testing:refused  Depression, major, single episode, moderate (HCC)  Tolerable control per pt.. discussed medication treatment options for GAD .Marland Kitchen she will consider.    Kerby Nora, MD

## 2019-10-04 NOTE — Patient Instructions (Signed)
Work on increasing exercise and healthy eating.  Call if you would like to consider med.

## 2020-03-02 ENCOUNTER — Other Ambulatory Visit: Payer: Self-pay

## 2020-03-03 NOTE — Progress Notes (Signed)
34 y.o. G0P0 Married White or Caucasian female here for annual exam.  Doing well.  Has been working full time.  Has lots of questions about Covid vaccination.  These were all addressed.  She and spouse are getting a divorce.    Cycles are regular.  On OCPs.  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  These are small but very tiny.  Has been seen at [facility] and now at [facility]    Patient's last menstrual period was 02/26/2020.          Sexually active: Yes.    The current method of family planning is TRI-SPRINTEC.    Exercising: Yes.    cardio, weights, yoga Smoker:  no  Health Maintenance: Pap: 01-28-19 Neg,  01/17/18 Neg  History of abnormal Pap:  Yes, 2015 Hx of LEEP--CIN III MMG:  n/a Colonoscopy:  n/a BMD:   n/a TDaP:  2017 Hep C testing: n/a Screening Labs: PCP   reports that she has never smoked. She has never used smokeless tobacco. She reports current alcohol use. She reports that she does not use drugs.  Past Medical History:  Diagnosis Date  . ADD (attention deficit disorder)   . History of chicken pox   . History of shingles     Past Surgical History:  Procedure Laterality Date  . LEEP N/A 06/30/2014   Procedure: LOOP ELECTROSURGICAL EXCISION PROCEDURE (LEEP);  Surgeon: Annamaria Boots, MD;  Location: [facility];  Service: Gynecology;  Laterality: N/A;  . WISDOM TOOTH EXTRACTION  2005    Current Outpatient Medications  Medication Sig Dispense Refill  . cyclobenzaprine (FLEXERIL) 10 MG tablet Take 10 mg by mouth as needed for muscle spasms.    . diclofenac (VOLTAREN) 75 MG EC tablet Take 75 mg by mouth as needed.    . fluticasone (FLONASE) 50 MCG/ACT nasal spray Place 2 sprays into both nostrils daily. 16 g 5  . Norgestimate-Ethinyl Estradiol Triphasic (TRI-SPRINTEC) 0.18/0.215/0.25 MG-35 MCG tablet Take 1 tablet by mouth daily. 3 Package 4  . valACYclovir (VALTREX) 1000 MG tablet Take 1 tablet (1,000 mg total) by mouth daily. 90 tablet 4   No current facility-administered  medications for this visit.    Family History  Problem Relation Age of Onset  . Hypertension Mother   . Hypertension Father   . Prostate cancer Father 78  . Melanoma Father 4  . Diabetes Maternal Grandmother   . Arthritis Maternal Grandmother   . Hypertension Maternal Grandmother   . Prostate cancer Maternal Grandfather   . Heart disease Paternal Grandmother   . Other Brother 74       Batten Disease - gentic disorder  - attacks brain and nervous system     Review of Systems  All other systems reviewed and are negative.   Exam:   BP 120/82 (BP Location: Right Arm, Patient Position: Sitting, Cuff Size: Normal)   Pulse 76   Temp (!) 97.4 F (36.3 C) (Temporal)   Resp 10   Ht 5' 4.75" (1.645 m)   Wt 154 lb (69.9 kg)   LMP 02/26/2020   BMI 25.83 kg/m    Height: 5' 4.75" (164.5 cm)  Ht Readings from Last 3 Encounters:  03/04/20 5' 4.75" (1.645 m)  10/04/19 5\' 3"  (1.6 m)  02/25/19 5\' 3"  (1.6 m)   General appearance: alert, cooperative and appears stated age Head: Normocephalic, without obvious abnormality, atraumatic Neck: no adenopathy, supple, symmetrical, trachea midline and thyroid normal to inspection and palpation Lungs: clear to  auscultation bilaterally Breasts: normal appearance, no masses or tenderness Heart: regular rate and rhythm Abdomen: soft, non-tender; bowel sounds normal; no masses,  no organomegaly Extremities: extremities normal, atraumatic, no cyanosis or edema Skin: Skin color, texture, turgor normal. No rashes or lesions Lymph nodes: Cervical, supraclavicular, and axillary nodes normal. No abnormal inguinal nodes palpated Neurologic: Grossly normal   Pelvic: External genitalia:  no lesions              Urethra:  normal appearing urethra with no masses, tenderness or lesions              Bartholins and Skenes: normal                 Vagina: normal appearing vagina with normal color and discharge, no lesions              Cervix: no lesions               Pap taken: Yes.   Bimanual Exam:  Uterus:  normal size, contour, position, consistency, mobility, non-tender              Adnexa: normal adnexa and no mass, fullness, tenderness               Rectovaginal: Confirms               Anus:  normal sphincter tone, no lesions  Desired no chaperone present for exam.  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  #3 month supply/4RF RF for Valtrex 1 gram daily.  Increase BID x 3 days with outbreaks.  #90/4RF Blood work done typically with Dr. Diona Browner Return annually or prn

## 2020-03-04 ENCOUNTER — Encounter: Payer: Self-pay | Admitting: Obstetrics & Gynecology

## 2020-03-04 ENCOUNTER — Other Ambulatory Visit (HOSPITAL_COMMUNITY)
Admission: RE | Admit: 2020-03-04 | Discharge: 2020-03-04 | Disposition: A | Discharge status: Home / Self Care | Payer: PRIVATE HEALTH INSURANCE | Source: Ambulatory Visit | Attending: Obstetrics & Gynecology | Admitting: Obstetrics & Gynecology

## 2020-03-04 ENCOUNTER — Ambulatory Visit: Payer: PRIVATE HEALTH INSURANCE | Admitting: Obstetrics & Gynecology

## 2020-03-04 ENCOUNTER — Other Ambulatory Visit: Payer: Self-pay

## 2020-03-04 VITALS — BP 120/82 | HR 76 | Temp 97.4°F | Resp 10 | Ht 64.75 in | Wt 154.0 lb

## 2020-03-04 DIAGNOSIS — Z01419 Encounter for gynecological examination (general) (routine) without abnormal findings: Secondary | ICD-10-CM | POA: Diagnosis not present

## 2020-03-04 DIAGNOSIS — N871 Moderate cervical dysplasia: Secondary | ICD-10-CM | POA: Diagnosis not present

## 2020-03-04 DIAGNOSIS — Z124 Encounter for screening for malignant neoplasm of cervix: Secondary | ICD-10-CM | POA: Diagnosis not present

## 2020-03-04 MED ORDER — NORGESTIM-ETH ESTRAD TRIPHASIC 0.18/0.215/0.25 MG-35 MCG PO TABS: 1.0000 | ORAL_TABLET | Freq: Every day | ORAL | 4 refills | Status: DC

## 2020-03-04 MED ORDER — VALACYCLOVIR HCL 1 G PO TABS: 1000.0000 mg | ORAL_TABLET | Freq: Every day | ORAL | 4 refills | Status: DC

## 2020-03-05 LAB — CYTOLOGY - PAP
Comment: NEGATIVE
Diagnosis: NEGATIVE
High risk HPV: NEGATIVE

## 2020-04-28 ENCOUNTER — Ambulatory Visit
Admission: RE | Admit: 2020-04-28 | Discharge: 2020-04-28 | Disposition: A | Discharge status: Home / Self Care | Payer: PRIVATE HEALTH INSURANCE | Source: Ambulatory Visit | Attending: Family Medicine | Admitting: Family Medicine

## 2020-04-28 ENCOUNTER — Ambulatory Visit (INDEPENDENT_AMBULATORY_CARE_PROVIDER_SITE_OTHER)
Admission: RE | Admit: 2020-04-28 | Discharge: 2020-04-28 | Disposition: A | Discharge status: Home / Self Care | Payer: PRIVATE HEALTH INSURANCE | Source: Ambulatory Visit | Attending: Family Medicine | Admitting: Family Medicine

## 2020-04-28 ENCOUNTER — Ambulatory Visit: Payer: PRIVATE HEALTH INSURANCE | Admitting: Family Medicine

## 2020-04-28 ENCOUNTER — Encounter: Payer: Self-pay | Admitting: Family Medicine

## 2020-04-28 ENCOUNTER — Other Ambulatory Visit: Payer: Self-pay

## 2020-04-28 VITALS — BP 148/96 | HR 96 | Temp 98.6°F | Ht 63.0 in | Wt 145.5 lb

## 2020-04-28 DIAGNOSIS — M79642 Pain in left hand: Secondary | ICD-10-CM

## 2020-04-28 DIAGNOSIS — M2550 Pain in unspecified joint: Secondary | ICD-10-CM

## 2020-04-28 DIAGNOSIS — M79641 Pain in right hand: Secondary | ICD-10-CM

## 2020-04-28 NOTE — Patient Instructions (Addendum)
We will call with X-ray and lab results. Can use diclofenac gel on hands 4  Times daily.  I will get records from Dr. Mauri Pole re you neck issues.

## 2020-04-28 NOTE — Assessment & Plan Note (Signed)
Carly Suarez; with X-rays and lab eval. Can use topical diclofenac gel on hands.

## 2020-04-28 NOTE — Assessment & Plan Note (Signed)
Eval with labs for autoimmune disease.  Get records from Dr. Mauri Pole for status of eval of neck.

## 2020-04-28 NOTE — Progress Notes (Signed)
Chief Complaint  Patient presents with  . Hand Pain    x years  . 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 She reports pain with movement in finger, pain with making grip.  Stiffness and pain greatest in AM.. better with movement after > 1 hours.  No redness, no swelling.  Mainly MCPs and PIPs.   Occ pain severe enough it is waking her at night    She also reports pain all over her body. She see Dr. Sherwood Gambler in past for neck issues, on diclofenac (not helping)  Also low back pain, hips.  Right knee pain, popping.   No other small joint pain.  No muscle pain. She works out, stretching.  No family history of RA, psoriasis.  No new meds, no new supplement.  She has separated from her husband in last year. Good and bad days. Denies depression.   This visit occurred during the SARS-CoV-2 public health emergency.  Safety protocols were in place, including screening questions prior to the visit, additional usage of staff PPE, and extensive cleaning of exam room while observing appropriate contact time as indicated for disinfecting solutions.   COVID 19 screen:  No recent travel or known exposure to COVID19 The patient denies respiratory symptoms of COVID 19 at this time. The importance of social distancing was discussed today.     Review of Systems  Constitutional: Negative for chills and fever.  HENT: Negative for congestion and ear pain.   Eyes: Negative for pain and redness.  Respiratory: Negative for cough and shortness of breath.   Cardiovascular: Negative for chest pain, palpitations and leg swelling.  Gastrointestinal: Negative for abdominal pain, blood in stool, constipation, diarrhea, nausea and vomiting.  Genitourinary: Negative for dysuria.  Musculoskeletal: Negative for falls and myalgias.  Skin:  Negative for rash.  Neurological: Negative for dizziness.  Psychiatric/Behavioral: Negative for depression. The patient is not nervous/anxious.       Past Medical History:  Diagnosis Date  . ADD (attention deficit disorder)   . History of chicken pox   . History of shingles     reports that she has never smoked. She has never used smokeless tobacco. She reports current alcohol use. She reports that she does not use drugs.   Current Outpatient Medications:  .  cyclobenzaprine (FLEXERIL) 10 MG tablet, Take 10 mg by mouth as needed for muscle spasms., Disp: , Rfl:  .  diclofenac (VOLTAREN) 75 MG EC tablet, Take 75 mg by mouth as needed., Disp: , Rfl:  .  fluticasone (FLONASE) 50 MCG/ACT nasal spray, Place 2 sprays into both nostrils daily., Disp: 16 g, Rfl: 5 .  Norgestimate-Ethinyl Estradiol Triphasic (TRI-SPRINTEC) 0.18/0.215/0.25 MG-35 MCG tablet, Take 1 tablet by mouth daily., Disp: 3 Package, Rfl: 4 .  valACYclovir (VALTREX) 1000 MG tablet, Take 1 tablet (1,000 mg total) by mouth daily., Disp: 90 tablet, Rfl: 4   Observations/Objective: Blood pressure (!) 148/96, pulse 96, temperature 98.6 F (37 C), temperature source Temporal, height 5\' 3"  (1.6 m), weight 145 lb 8 oz (66 kg), SpO2 99 %.  Physical Exam Constitutional:      General: She is not in acute distress.    Appearance: Normal appearance. She is well-developed. She is not ill-appearing or toxic-appearing.  HENT:  Head: Normocephalic.     Right Ear: Hearing, tympanic membrane, ear canal and external ear normal. Tympanic membrane is not erythematous, retracted or bulging.     Left Ear: Hearing, tympanic membrane, ear canal and external ear normal. Tympanic membrane is not erythematous, retracted or bulging.     Nose: No mucosal edema or rhinorrhea.     Right Sinus: No maxillary sinus tenderness or frontal sinus tenderness.     Left Sinus: No maxillary sinus tenderness or frontal sinus tenderness.     Mouth/Throat:      Pharynx: Uvula midline.  Eyes:     General: Lids are normal. Lids are everted, no foreign bodies appreciated.     Conjunctiva/sclera: Conjunctivae normal.     Pupils: Pupils are equal, round, and reactive to light.  Neck:     Thyroid: No thyroid mass or thyromegaly.     Vascular: No carotid bruit.     Trachea: Trachea normal.  Cardiovascular:     Rate and Rhythm: Normal rate and regular rhythm.     Pulses: Normal pulses.     Heart sounds: Normal heart sounds, S1 normal and S2 normal. No murmur. No friction rub. No gallop.   Pulmonary:     Effort: Pulmonary effort is normal. No tachypnea or respiratory distress.     Breath sounds: Normal breath sounds. No decreased breath sounds, wheezing, rhonchi or rales.  Abdominal:     General: Bowel sounds are normal.     Palpations: Abdomen is soft.     Tenderness: There is no abdominal tenderness.  Musculoskeletal:     Cervical back: Normal range of motion and neck supple.  Skin:    General: Skin is warm and dry.     Findings: No rash.  Neurological:     Mental Status: She is alert.  Psychiatric:        Mood and Affect: Mood is not anxious or depressed.        Speech: Speech normal.        Behavior: Behavior normal. Behavior is cooperative.        Thought Content: Thought content normal.        Judgment: Judgment normal.      Assessment and Plan   Arthralgia Eval with labs for autoimmune disease.  Get records from Dr. Mauri Pole for status of eval of neck.   Bilateral hand pain Carley Hammed; with X-rays and lab eval. Can use topical diclofenac gel on hands.     Kerby Nora, MD

## 2020-04-29 LAB — SEDIMENTATION RATE: Sed Rate: 4 mm/hr (ref 0–20)

## 2020-04-29 LAB — C-REACTIVE PROTEIN: CRP: <1 mg/dL (ref 0.5–20.0)

## 2020-05-01 LAB — ANTI-NUCLEAR AB-TITER (ANA TITER): ANA Titer 1: 1:80 {titer} — ABNORMAL HIGH

## 2020-05-01 LAB — ANA: Anti Nuclear Antibody (ANA): POSITIVE — AB

## 2020-05-01 LAB — CYCLIC CITRUL PEPTIDE ANTIBODY, IGG: Cyclic Citrullin Peptide Ab: <16 UNITS

## 2020-05-01 LAB — RHEUMATOID FACTOR: Rheumatoid fact SerPl-aCnc: <14 IU/mL (ref <14)

## 2020-05-05 ENCOUNTER — Telehealth: Payer: Self-pay | Admitting: Family Medicine

## 2020-05-05 DIAGNOSIS — M79642 Pain in left hand: Secondary | ICD-10-CM

## 2020-05-05 DIAGNOSIS — M2550 Pain in unspecified joint: Secondary | ICD-10-CM

## 2020-05-05 DIAGNOSIS — R768 Other specified abnormal immunological findings in serum: Secondary | ICD-10-CM

## 2020-05-05 NOTE — Telephone Encounter (Signed)
-----   Message from Damita Lack, CMA sent at 05/05/2020  4:43 PM EDT ----- Pattricia Boss notified as instructed by telephone.  She would like to go ahead with the Rheumatology referral. She prefers [place].  Appointment also scheduled with Dr. [name] on 05/08/2020 at 3:20 pm to discuss neck pain and medication options.  (Dr. Mauri Pole is retired).

## 2020-05-08 ENCOUNTER — Ambulatory Visit: Payer: PRIVATE HEALTH INSURANCE | Admitting: Family Medicine

## 2020-05-08 ENCOUNTER — Encounter: Payer: Self-pay | Admitting: Family Medicine

## 2020-05-08 ENCOUNTER — Other Ambulatory Visit: Payer: Self-pay

## 2020-05-08 VITALS — BP 133/89 | HR 74 | Temp 98.2°F | Ht 64.75 in | Wt 146.0 lb

## 2020-05-08 DIAGNOSIS — M542 Cervicalgia: Secondary | ICD-10-CM | POA: Diagnosis not present

## 2020-05-08 DIAGNOSIS — M2550 Pain in unspecified joint: Secondary | ICD-10-CM | POA: Diagnosis not present

## 2020-05-08 DIAGNOSIS — R7689 Other specified abnormal immunological findings in serum: Secondary | ICD-10-CM | POA: Insufficient documentation

## 2020-05-08 DIAGNOSIS — G8929 Other chronic pain: Secondary | ICD-10-CM | POA: Diagnosis not present

## 2020-05-08 DIAGNOSIS — R768 Other specified abnormal immunological findings in serum: Secondary | ICD-10-CM | POA: Diagnosis not present

## 2020-05-08 MED ORDER — GABAPENTIN 100 MG PO CAPS
100.0000 mg | ORAL_CAPSULE | Freq: Every day | ORAL | 3 refills | Status: DC
Start: 2020-05-08 — End: 2020-06-05

## 2020-05-08 NOTE — Assessment & Plan Note (Signed)
Due to cervical disc disease last seen on MRI 2013. Will start with PT and trial of neurontin.  If not improving.. consider repeat X-ray and referral to PMR.

## 2020-05-08 NOTE — Progress Notes (Signed)
Chief Complaint  Patient presents with  . Neck Pain    Discuss Medication Options    History of Present Illness: HPI   34 year old female presents for evaluation chronic neck pain.  She has seen neurosurgery in past.. Dx with cervical herniated disc. Treated with diclofenac  BID. Pain mainly on right neck. No vertebral pain. Daily pain, moderate 7-10. Spasm in right neck.  No radiation of pain into right arm. No numbness or weakness.   Ongoing x 3-4 years. No recent falls   She reports pain not well controlled with this.  MRI 2013 showed cervical DDD   Arthralgia diffuse, especially hands: negative hand films, neg rheum labs except positive ASA.  Referral to rheumatology pending.  This visit occurred during the SARS-CoV-2 public health emergency.  Safety protocols were in place, including screening questions prior to the visit, additional usage of staff PPE, and extensive cleaning of exam room while observing appropriate contact time as indicated for disinfecting solutions.   COVID 19 screen:  No recent travel or known exposure to COVID19 The patient denies respiratory symptoms of COVID 19 at this time. The importance of social distancing was discussed today.     Review of Systems  Constitutional: Negative for chills and fever.  HENT: Negative for congestion and ear pain.   Eyes: Negative for pain and redness.  Respiratory: Negative for cough and shortness of breath.   Cardiovascular: Negative for chest pain, palpitations and leg swelling.  Gastrointestinal: Negative for abdominal pain, blood in stool, constipation, diarrhea, nausea and vomiting.  Genitourinary: Negative for dysuria.  Musculoskeletal: Negative for falls and myalgias.  Skin: Negative for rash.  Neurological: Negative for dizziness.  Psychiatric/Behavioral: Negative for depression. The patient is not nervous/anxious.       Past Medical History:  Diagnosis Date  . ADD (attention deficit disorder)   .  History of chicken pox   . History of shingles     reports that she has never smoked. She has never used smokeless tobacco. She reports current alcohol use. She reports that she does not use drugs.   Current Outpatient Medications:  .  cyclobenzaprine (FLEXERIL) 10 MG tablet, Take 10 mg by mouth as needed for muscle spasms., Disp: , Rfl:  .  diclofenac (VOLTAREN) 75 MG EC tablet, Take 75 mg by mouth as needed., Disp: , Rfl:  .  fluticasone (FLONASE) 50 MCG/ACT nasal spray, Place 2 sprays into both nostrils daily., Disp: 16 g, Rfl: 5 .  Norgestimate-Ethinyl Estradiol Triphasic (TRI-SPRINTEC) 0.18/0.215/0.25 MG-35 MCG tablet, Take 1 tablet by mouth daily., Disp: 3 Package, Rfl: 4 .  valACYclovir (VALTREX) 1000 MG tablet, Take 1 tablet (1,000 mg total) by mouth daily., Disp: 90 tablet, Rfl: 4   Observations/Objective: Blood pressure 133/89, pulse 74, temperature 98.2 F (36.8 C), temperature source Temporal, height 5' 4.75" (1.645 m), weight 146 lb (66.2 kg), last menstrual period 04/21/2020, SpO2 98 %.  Physical Exam Constitutional:      General: She is not in acute distress.    Appearance: Normal appearance. She is well-developed. She is not ill-appearing or toxic-appearing.  HENT:     Head: Normocephalic.     Right Ear: Hearing, tympanic membrane, ear canal and external ear normal. Tympanic membrane is not erythematous, retracted or bulging.     Left Ear: Hearing, tympanic membrane, ear canal and external ear normal. Tympanic membrane is not erythematous, retracted or bulging.     Nose: No mucosal edema or rhinorrhea.  Right Sinus: No maxillary sinus tenderness or frontal sinus tenderness.     Left Sinus: No maxillary sinus tenderness or frontal sinus tenderness.     Mouth/Throat:     Pharynx: Uvula midline.  Eyes:     General: Lids are normal. Lids are everted, no foreign bodies appreciated.     Conjunctiva/sclera: Conjunctivae normal.     Pupils: Pupils are equal, round, and  reactive to light.  Neck:     Thyroid: No thyroid mass or thyromegaly.     Vascular: No carotid bruit.     Trachea: Trachea normal.  Cardiovascular:     Rate and Rhythm: Normal rate and regular rhythm.     Pulses: Normal pulses.     Heart sounds: Normal heart sounds, S1 normal and S2 normal. No murmur heard.  No friction rub. No gallop.   Pulmonary:     Effort: Pulmonary effort is normal. No tachypnea or respiratory distress.     Breath sounds: Normal breath sounds. No decreased breath sounds, wheezing, rhonchi or rales.  Abdominal:     General: Bowel sounds are normal.     Palpations: Abdomen is soft.     Tenderness: There is no abdominal tenderness.  Musculoskeletal:     Cervical back: Neck supple. Tenderness present. No deformity, erythema or bony tenderness. Pain with movement present. Decreased range of motion.     Comments: Positive spurling with pain to right shoulder... pain in in arm  Skin:    General: Skin is warm and dry.     Findings: No rash.  Neurological:     Mental Status: She is alert.  Psychiatric:        Mood and Affect: Mood is not anxious or depressed.        Speech: Speech normal.        Behavior: Behavior normal. Behavior is cooperative.        Thought Content: Thought content normal.        Judgment: Judgment normal.      Assessment and Plan   Chronic neck pain Due to cervical disc disease last seen on MRI 2013. Will start with PT and trial of neurontin.  If not improving.. consider repeat X-ray and referral to PMR.  Positive ANA (antinuclear antibody) Refer to rheum for eval.     Eliezer Lofts, MD

## 2020-05-08 NOTE — Assessment & Plan Note (Signed)
Refer to rheum for eval

## 2020-05-08 NOTE — Patient Instructions (Addendum)
Restart PT for neck pain.  Start neurontin for pain at bedtime. Call if not tolerating. You can increase if no improvement in 2 weeks.. 1 tab twice daily  Keep rheumatology referral.

## 2020-06-05 ENCOUNTER — Ambulatory Visit: Payer: PRIVATE HEALTH INSURANCE | Admitting: Family Medicine

## 2020-06-05 ENCOUNTER — Ambulatory Visit (INDEPENDENT_AMBULATORY_CARE_PROVIDER_SITE_OTHER)
Admission: RE | Admit: 2020-06-05 | Discharge: 2020-06-05 | Disposition: A | Discharge status: Home / Self Care | Payer: PRIVATE HEALTH INSURANCE | Source: Ambulatory Visit | Attending: Family Medicine | Admitting: Family Medicine

## 2020-06-05 VITALS — BP 122/78 | HR 74 | Temp 98.4°F | Ht 64.75 in | Wt 146.8 lb

## 2020-06-05 DIAGNOSIS — M542 Cervicalgia: Secondary | ICD-10-CM

## 2020-06-05 DIAGNOSIS — G8929 Other chronic pain: Secondary | ICD-10-CM

## 2020-06-05 MED ORDER — GABAPENTIN 100 MG PO CAPS
100.0000 mg | ORAL_CAPSULE | Freq: Three times a day (TID) | ORAL | 1 refills | Status: DC
Start: 2020-06-05 — End: 2021-01-29

## 2020-06-05 NOTE — Assessment & Plan Note (Signed)
Given continued pain  eval with Plain X-ray, may need further eval with MRi. The patient has failed> 6 weeks of conservative treatment including tylenol, heat, ibuprfen, diclofenac and low dose neurontin.   Trial of increased dose of neurontin 100 mg TID.

## 2020-06-05 NOTE — Patient Instructions (Addendum)
Increase Neurontin to 100 mg at bedtime and  One in AM, if not SE but no improvement increase to three times daily.  We will call with X-ray results.

## 2020-06-05 NOTE — Progress Notes (Signed)
Chief Complaint  Patient presents with   Follow-up    neck pain     History of Present Illness: HPI    34 year old female presents for follow up chronic  Cervical radiculopathy resulting in neck pain.   At last OV she started PT and trial of Neurontin 100 mg at bedtime, she has not noted any SE, no improvement in ncek pain.  She has had a flare up this week.. pain in right neck, into upper shoulder.  No new numbness, no new weakness. No clear triggers, no falls no injuries.  Diclofenac po BID.   This visit occurred during the SARS-CoV-2 public health emergency.  Safety protocols were in place, including screening questions prior to the visit, additional usage of staff PPE, and extensive cleaning of exam room while observing appropriate contact time as indicated for disinfecting solutions.   COVID 19 screen:  No recent travel or known exposure to COVID19 The patient denies respiratory symptoms of COVID 19 at this time. The importance of social distancing was discussed today.     Review of Systems  Constitutional: Negative for chills and fever.  HENT: Negative for congestion and ear pain.   Eyes: Negative for pain and redness.  Respiratory: Negative for cough and shortness of breath.   Cardiovascular: Negative for chest pain, palpitations and leg swelling.  Gastrointestinal: Negative for abdominal pain, blood in stool, constipation, diarrhea, nausea and vomiting.  Genitourinary: Negative for dysuria.  Musculoskeletal: Positive for neck pain. Negative for falls and myalgias.  Skin: Negative for rash.  Neurological: Negative for dizziness.  Psychiatric/Behavioral: Negative for depression. The patient is not nervous/anxious.       Past Medical History:  Diagnosis Date   ADD (attention deficit disorder)    History of chicken pox    History of shingles     reports that she has never smoked. She has never used smokeless tobacco. She reports current alcohol use. She reports  that she does not use drugs.   Current Outpatient Medications:    cyclobenzaprine (FLEXERIL) 10 MG tablet, Take 10 mg by mouth as needed for muscle spasms., Disp: , Rfl:    fluticasone (FLONASE) 50 MCG/ACT nasal spray, Place 2 sprays into both nostrils daily., Disp: 16 g, Rfl: 5   gabapentin (NEURONTIN) 100 MG capsule, Take 1 capsule (100 mg total) by mouth at bedtime., Disp: 30 capsule, Rfl: 3   Norgestimate-Ethinyl Estradiol Triphasic (TRI-SPRINTEC) 0.18/0.215/0.25 MG-35 MCG tablet, Take 1 tablet by mouth daily., Disp: 3 Package, Rfl: 4   valACYclovir (VALTREX) 1000 MG tablet, Take 1 tablet (1,000 mg total) by mouth daily., Disp: 90 tablet, Rfl: 4   Observations/Objective: Blood pressure 122/78, pulse 74, temperature 98.4 F (36.9 C), temperature source Temporal, height 5' 4.75" (1.645 m), weight 146 lb 12 oz (66.6 kg), SpO2 98 %.  Physical Exam Constitutional:      General: She is not in acute distress.    Appearance: Normal appearance. She is well-developed. She is not ill-appearing or toxic-appearing.  HENT:     Head: Normocephalic.     Right Ear: Hearing, tympanic membrane, ear canal and external ear normal. Tympanic membrane is not erythematous, retracted or bulging.     Left Ear: Hearing, tympanic membrane, ear canal and external ear normal. Tympanic membrane is not erythematous, retracted or bulging.     Nose: No mucosal edema or rhinorrhea.     Right Sinus: No maxillary sinus tenderness or frontal sinus tenderness.     Left Sinus: No  maxillary sinus tenderness or frontal sinus tenderness.     Mouth/Throat:     Pharynx: Uvula midline.  Eyes:     General: Lids are normal. Lids are everted, no foreign bodies appreciated.     Conjunctiva/sclera: Conjunctivae normal.     Pupils: Pupils are equal, round, and reactive to light.  Neck:     Thyroid: No thyroid mass or thyromegaly.     Vascular: No carotid bruit.     Trachea: Trachea normal.  Cardiovascular:     Rate and  Rhythm: Normal rate and regular rhythm.     Pulses: Normal pulses.     Heart sounds: Normal heart sounds, S1 normal and S2 normal. No murmur heard.  No friction rub. No gallop.   Pulmonary:     Effort: Pulmonary effort is normal. No tachypnea or respiratory distress.     Breath sounds: Normal breath sounds. No decreased breath sounds, wheezing, rhonchi or rales.  Abdominal:     General: Bowel sounds are normal.     Palpations: Abdomen is soft.     Tenderness: There is no abdominal tenderness.  Musculoskeletal:     Cervical back: Neck supple. Tenderness present. No deformity, erythema or bony tenderness. Pain with movement present. Decreased range of motion.     Comments: Positive spurling with pain to right shoulder... pain in in arm  Skin:    General: Skin is warm and dry.     Findings: No rash.  Neurological:     Mental Status: She is alert.  Psychiatric:        Mood and Affect: Mood is not anxious or depressed.        Speech: Speech normal.        Behavior: Behavior normal. Behavior is cooperative.        Thought Content: Thought content normal.        Judgment: Judgment normal.      Assessment and Plan   Chronic neck pain Given continued pain  eval with Plain X-ray, may need further eval with MRi. The patient has failed> 6 weeks of conservative treatment including tylenol, heat, ibuprfen, diclofenac and low dose neurontin.   Trial of increased dose of neurontin 100 mg TID.       Kerby Nora, MD

## 2020-10-08 ENCOUNTER — Other Ambulatory Visit: Payer: PRIVATE HEALTH INSURANCE

## 2020-10-13 ENCOUNTER — Encounter: Payer: PRIVATE HEALTH INSURANCE | Admitting: Family Medicine

## 2020-10-22 ENCOUNTER — Telehealth: Payer: Self-pay | Admitting: Family Medicine

## 2020-10-22 DIAGNOSIS — Z1159 Encounter for screening for other viral diseases: Secondary | ICD-10-CM

## 2020-10-22 DIAGNOSIS — Z13220 Encounter for screening for lipoid disorders: Secondary | ICD-10-CM

## 2020-10-22 NOTE — Telephone Encounter (Signed)
-----   Message from Terri J Walsh sent at 10/06/2020  2:22 PM EST ----- Regarding: lab orders for Friday, 12.3.21 Patient is scheduled for CPX labs, please order future labs, Thanks , Terri  

## 2020-10-23 ENCOUNTER — Other Ambulatory Visit (INDEPENDENT_AMBULATORY_CARE_PROVIDER_SITE_OTHER): Payer: PRIVATE HEALTH INSURANCE

## 2020-10-23 ENCOUNTER — Other Ambulatory Visit: Payer: Self-pay

## 2020-10-23 DIAGNOSIS — Z1159 Encounter for screening for other viral diseases: Secondary | ICD-10-CM

## 2020-10-23 DIAGNOSIS — Z13220 Encounter for screening for lipoid disorders: Secondary | ICD-10-CM | POA: Diagnosis not present

## 2020-10-23 LAB — COMPREHENSIVE METABOLIC PANEL
ALT: 13 U/L (ref 0–35)
AST: 15 U/L (ref 0–37)
Albumin: 4.1 g/dL (ref 3.5–5.2)
Alkaline Phosphatase: 41 U/L (ref 39–117)
BUN: 13 mg/dL (ref 6–23)
CO2: 27 mEq/L (ref 19–32)
Calcium: 9.7 mg/dL (ref 8.4–10.5)
Chloride: 103 mEq/L (ref 96–112)
Creatinine, Ser: 0.72 mg/dL (ref 0.40–1.20)
GFR: 108.9 mL/min (ref >60.00)
Glucose, Bld: 86 mg/dL (ref 70–99)
Potassium: 4 mEq/L (ref 3.5–5.1)
Sodium: 138 mEq/L (ref 135–145)
Total Bilirubin: 0.5 mg/dL (ref 0.2–1.2)
Total Protein: 7 g/dL (ref 6.0–8.3)

## 2020-10-23 LAB — LIPID PANEL
Cholesterol: 186 mg/dL (ref 0–200)
HDL: 76.1 mg/dL (ref >39.00)
LDL Cholesterol: 82 mg/dL (ref 0–99)
NonHDL: 110.25
Total CHOL/HDL Ratio: 2
Triglycerides: 140 mg/dL (ref 0.0–149.0)
VLDL: 28 mg/dL (ref 0.0–40.0)

## 2020-10-23 NOTE — Progress Notes (Signed)
No critical labs need to be addressed urgently. We will discuss labs in detail at upcoming office visit.   

## 2020-10-26 LAB — HEPATITIS C ANTIBODY
Hepatitis C Ab: NONREACTIVE
SIGNAL TO CUT-OFF: 0 (ref <1.00)

## 2020-10-26 NOTE — Progress Notes (Signed)
No critical labs need to be addressed urgently. We will discuss labs in detail at upcoming office visit.   

## 2020-10-30 ENCOUNTER — Other Ambulatory Visit: Payer: Self-pay

## 2020-10-30 ENCOUNTER — Encounter: Payer: Self-pay | Admitting: Family Medicine

## 2020-10-30 ENCOUNTER — Ambulatory Visit (INDEPENDENT_AMBULATORY_CARE_PROVIDER_SITE_OTHER): Payer: PRIVATE HEALTH INSURANCE | Admitting: Family Medicine

## 2020-10-30 VITALS — BP 126/98 | HR 77 | Temp 97.9°F | Ht 65.0 in | Wt 140.0 lb

## 2020-10-30 DIAGNOSIS — Z0000 Encounter for general adult medical examination without abnormal findings: Secondary | ICD-10-CM

## 2020-10-30 DIAGNOSIS — F321 Major depressive disorder, single episode, moderate: Secondary | ICD-10-CM | POA: Diagnosis not present

## 2020-10-30 DIAGNOSIS — F411 Generalized anxiety disorder: Secondary | ICD-10-CM | POA: Diagnosis not present

## 2020-10-30 MED ORDER — SERTRALINE HCL 50 MG PO TABS: 50.0000 mg | ORAL_TABLET | Freq: Every day | ORAL | 3 refills | Status: DC

## 2020-10-30 NOTE — Patient Instructions (Addendum)
Work on low salt diet, low caffeine, decrease alcohol. Increase exercise and heart healthy diet. Follow BP every few days.Marland Kitchen goal < 140/90.Marland Kitchen ideally 120/70

## 2020-10-30 NOTE — Progress Notes (Signed)
Chief Complaint  Patient presents with  . Annual Exam    History of Present Illness: HPI  The patient is here for annual wellness exam and preventative care.    MDD, GAD:  Worsened GAD > 2 months, She reports she is still very anxious, cannot relax, excessive worrying, cannot staying sleep Flowsheet Row Office Visit from 10/30/2020 in [facility] Total Score 2     Depression stable.  negative screen for bipolar disorder.  Diet: healthy Exercise: active job. Reviewed labs in detail with pt.   Elevated blood pressure today. She reports BP has been running high at home for a while.  131/85, 150/102, 146/98... took on days when stressed... no CP, palpitations   Family history of HTN BP Readings from Last 3 Encounters:  10/30/20 (!) 126/98  06/05/20 122/78  05/08/20 133/89   Wt Readings from Last 3 Encounters:  10/30/20 140 lb (63.5 kg)  06/05/20 146 lb 12 oz (66.6 kg)  05/08/20 146 lb (66.2 kg)     Lab Results  Component Value Date   CHOL 186 10/23/2020   HDL 76.10 10/23/2020   LDLCALC 82 10/23/2020   TRIG 140.0 10/23/2020   CHOLHDL 2 10/23/2020    This visit occurred during the SARS-CoV-2 public health emergency.  Safety protocols were in place, including screening questions prior to the visit, additional usage of staff PPE, and extensive cleaning of exam room while observing appropriate contact time as indicated for disinfecting solutions.   COVID 19 screen:  No recent travel or known exposure to COVID19 The patient denies respiratory symptoms of COVID 19 at this time. The importance of social distancing was discussed today.    Review of Systems  Constitutional: Negative for chills and fever.  HENT: Negative for congestion and ear pain.   Eyes: Negative for pain and redness.  Respiratory: Negative for cough and shortness of breath.   Cardiovascular: Negative for chest pain, palpitations and leg swelling.  Gastrointestinal:  Negative for abdominal pain, blood in stool, constipation, diarrhea, nausea and vomiting.  Genitourinary: Negative for dysuria.  Musculoskeletal: Negative for falls and myalgias.  Skin: Negative for rash.  Neurological: Negative for dizziness.  Psychiatric/Behavioral: Negative for depression, hallucinations, substance abuse and suicidal ideas. The patient is nervous/anxious and has insomnia.       Past Medical History:  Diagnosis Date  . ADD (attention deficit disorder)   . History of chicken pox   . History of shingles     reports that she has never smoked. She has never used smokeless tobacco. She reports current alcohol use. She reports that she does not use drugs.  Current Outpatient Medications on File Prior to Visit  Medication Sig Dispense Refill  . cyclobenzaprine (FLEXERIL) 10 MG tablet Take 10 mg by mouth as needed for muscle spasms.    . fluticasone (FLONASE) 50 MCG/ACT nasal spray Place 2 sprays into both nostrils daily. 16 g 5  . gabapentin (NEURONTIN) 100 MG capsule Take 1 capsule (100 mg total) by mouth 3 (three) times daily. 90 capsule 1  . Norgestimate-Ethinyl Estradiol Triphasic (TRI-SPRINTEC) 0.18/0.215/0.25 MG-35 MCG tablet Take 1 tablet by mouth daily. 3 Package 4  . valACYclovir (VALTREX) 1000 MG tablet Take 1 tablet (1,000 mg total) by mouth daily. 90 tablet 4   No current facility-administered medications on file prior to visit.   Observations/Objective: Blood pressure (!) 126/98, pulse 77, temperature 97.9 F (36.6 C), temperature source Temporal, height 5\' 5"  (1.651 m), weight 140  lb (63.5 kg), SpO2 99 %.  Physical Exam Constitutional:      General: She is not in acute distress.Vital signs are normal.     Appearance: Normal appearance. She is well-developed and well-nourished. She is not ill-appearing or toxic-appearing.  HENT:     Head: Normocephalic.     Right Ear: Hearing, tympanic membrane, ear canal and external ear normal.     Left Ear: Hearing,  tympanic membrane, ear canal and external ear normal.     Nose: Nose normal.  Eyes:     General: Lids are normal. Lids are everted, no foreign bodies appreciated.     Extraocular Movements: EOM normal.     Conjunctiva/sclera: Conjunctivae normal.     Pupils: Pupils are equal, round, and reactive to light.  Neck:     Thyroid: No thyroid mass or thyromegaly.     Vascular: No carotid bruit.     Trachea: Trachea normal.  Cardiovascular:     Rate and Rhythm: Normal rate and regular rhythm.     Pulses: Intact distal pulses.     Heart sounds: Normal heart sounds, S1 normal and S2 normal. No murmur heard. No gallop.   Pulmonary:     Effort: Pulmonary effort is normal. No respiratory distress.     Breath sounds: Normal breath sounds. No wheezing, rhonchi or rales.  Abdominal:     General: Bowel sounds are normal. There is no distension or abdominal bruit.     Palpations: Abdomen is soft. There is no fluid wave, hepatosplenomegaly or mass.     Tenderness: There is no abdominal tenderness. There is no CVA tenderness, guarding or rebound.     Hernia: No hernia is present.  Musculoskeletal:     Cervical back: Normal range of motion and neck supple.  Lymphadenopathy:     Cervical: No cervical adenopathy.     Upper Body:  No axillary adenopathy present. Skin:    General: Skin is warm, dry and intact.     Findings: No rash.  Neurological:     Mental Status: She is alert.     Cranial Nerves: No cranial nerve deficit.     Sensory: No sensory deficit.     Deep Tendon Reflexes: Strength normal.  Psychiatric:        Mood and Affect: Mood is not anxious or depressed.        Speech: Speech normal.        Behavior: Behavior normal. Behavior is cooperative.        Cognition and Memory: Cognition and memory normal.        Judgment: Judgment normal.      Assessment and Plan   The patient's preventative maintenance and recommended screening tests for an annual wellness exam were reviewed in full  today. Brought up to date unless services declined.  Counselled on the importance of diet, exercise, and its role in overall health and mortality. The patient's FH and SH was reviewed, including their home life, tobacco status, and drug and alcohol status.   Vaccines: Tdapuptodate 2017, has received HPV x 3, COVID x 3, refused flu. PAP/Breast: per GYN.. 02/2020 neg pap, no early family history of breast x cancer Colon: no early family history. Nonsmoker STD testing:refused  Hep C: done ETOH 0-7 per week.Marland Kitchen occ 2-3 at a time.  Problem List Items Addressed This Visit    Depression, major, single episode, moderate (HCC)    Chronic, stable control.      Relevant Medications  sertraline (ZOLOFT) 50 MG tablet   GAD (generalized anxiety disorder)    Chronic, poor control, worsening. Offered counseling, pt not interested. Encouraged relaxation and stress reduction. Start sertraline 50 mg at bedtime.  Close follow up in 4 weeks.      Relevant Medications   sertraline (ZOLOFT) 50 MG tablet    Other Visit Diagnoses    Routine general medical examination at a health care facility    -  Primary     Kerby Nora, MD

## 2020-10-30 NOTE — Assessment & Plan Note (Signed)
Chronic, poor control, worsening. Offered counseling, pt not interested. Encouraged relaxation and stress reduction. Start sertraline 50 mg at bedtime.  Close follow up in 4 weeks.

## 2020-10-30 NOTE — Progress Notes (Deleted)
Patient ID: Carly Suarez, female   DOB: 1986-08-08, 34 y.o.   MRN: [id number]  Chief Complaint  Patient presents with  . Annual Exam    History of Present Illness: HPI    This visit occurred during the SARS-CoV-2 public health emergency.  Safety protocols were in place, including screening questions prior to the visit, additional usage of staff PPE, and extensive cleaning of exam room while observing appropriate contact time as indicated for disinfecting solutions.   COVID 19 screen:  No recent travel or known exposure to COVID19 The patient denies respiratory symptoms of COVID 19 at this time. The importance of social distancing was discussed today.    ROS    Past Medical History:  Diagnosis Date  . ADD (attention deficit disorder)   . History of chicken pox   . History of shingles     reports that she has never smoked. She has never used smokeless tobacco. She reports current alcohol use. She reports that she does not use drugs.  Current Outpatient Medications on File Prior to Visit  Medication Sig Dispense Refill  . cyclobenzaprine (FLEXERIL) 10 MG tablet Take 10 mg by mouth as needed for muscle spasms.    . fluticasone (FLONASE) 50 MCG/ACT nasal spray Place 2 sprays into both nostrils daily. 16 g 5  . gabapentin (NEURONTIN) 100 MG capsule Take 1 capsule (100 mg total) by mouth 3 (three) times daily. 90 capsule 1  . Norgestimate-Ethinyl Estradiol Triphasic (TRI-SPRINTEC) 0.18/0.215/0.25 MG-35 MCG tablet Take 1 tablet by mouth daily. 3 Package 4  . valACYclovir (VALTREX) 1000 MG tablet Take 1 tablet (1,000 mg total) by mouth daily. 90 tablet 4   No current facility-administered medications on file prior to visit.    Observations/Objective: Blood pressure (!) 126/98, pulse 77, temperature 97.9 F (36.6 C), temperature source Temporal, height 5\' 5"  (1.651 m), weight 140 lb (63.5 kg), SpO2 99 %.  Physical Exam   Assessment and Plan Problem List Items Addressed This Visit     Depression, major, single episode, moderate (HCC)   Relevant Medications   sertraline (ZOLOFT) 50 MG tablet   GAD (generalized anxiety disorder)   Relevant Medications   sertraline (ZOLOFT) 50 MG tablet    Other Visit Diagnoses    Routine general medical examination at a health care facility    -  Primary     Meds ordered this encounter  Medications  . sertraline (ZOLOFT) 50 MG tablet    Sig: Take 1 tablet (50 mg total) by mouth daily.    Dispense:  30 tablet    Refill:  3   No orders of the defined types were placed in this encounter.      , MD

## 2020-10-30 NOTE — Assessment & Plan Note (Signed)
Chronic, stable control 

## 2020-11-26 ENCOUNTER — Other Ambulatory Visit: Payer: Self-pay

## 2020-11-27 ENCOUNTER — Ambulatory Visit: Payer: PRIVATE HEALTH INSURANCE | Admitting: Family Medicine

## 2020-11-27 VITALS — BP 130/90 | HR 69 | Temp 97.9°F | Ht 65.0 in | Wt 140.0 lb

## 2020-11-27 DIAGNOSIS — F411 Generalized anxiety disorder: Secondary | ICD-10-CM

## 2020-11-27 DIAGNOSIS — G8929 Other chronic pain: Secondary | ICD-10-CM

## 2020-11-27 DIAGNOSIS — M5090 Cervical disc disorder, unspecified, unspecified cervical region: Secondary | ICD-10-CM | POA: Diagnosis not present

## 2020-11-27 DIAGNOSIS — R768 Other specified abnormal immunological findings in serum: Secondary | ICD-10-CM

## 2020-11-27 DIAGNOSIS — M542 Cervicalgia: Secondary | ICD-10-CM

## 2020-11-27 DIAGNOSIS — F321 Major depressive disorder, single episode, moderate: Secondary | ICD-10-CM

## 2020-11-27 MED ORDER — SERTRALINE HCL 100 MG PO TABS
100.0000 mg | ORAL_TABLET | Freq: Every day | ORAL | 5 refills | Status: DC
Start: 2020-11-27 — End: 2020-12-21

## 2020-11-27 NOTE — Assessment & Plan Note (Signed)
Slightly improved but minimal.  Increase sertraline to 100 mg daily.

## 2020-11-27 NOTE — Assessment & Plan Note (Signed)
Not clear associated with rheumatologic disease per Dr. Allena Katz Rheum OV.

## 2020-11-27 NOTE — Progress Notes (Signed)
Patient ID: Carly Suarez, female    DOB: 06-14-1986, 35 y.o.   MRN: [id number]  This visit was conducted in person.  BP 130/90   Pulse 69   Temp 97.9 F (36.6 C) (Temporal)   Ht 5\' 5"  (1.651 m)   Wt 140 lb (63.5 kg)   LMP 11/04/2020 (Exact Date)   SpO2 99%   BMI 23.30 kg/m    CC: mood and BP Subjective:   HPI: Carly Suarez is a 35 y.o. female presenting on 11/27/2020 for Follow-up (4 week )   MDD, GAD: At last OV started on sertraline 50 mg daily. No SE to the medications, no benefit at all. Still issues sleeping.  PHQ9 SCORE ONLY 11/27/2020 10/30/2020 10/04/2019  PHQ-9 Total Score 13 9 2     GAD 7 : Generalized Anxiety Score 11/27/2020 10/30/2020  Nervous, Anxious, on Edge 2 3  Control/stop worrying 3 3  Worry too much - different things 2 3  Trouble relaxing 2 3  Restless 2 3  Easily annoyed or irritable 3 3  Afraid - awful might happen 1 1  Total GAD 7 Score 15 19  Anxiety Difficulty Very difficult Very difficult      Blood pressure control is somewhat better today in office BP Readings from Last 3 Encounters:  11/27/20 130/90  10/30/20 (!) 126/98  06/05/20 122/78    Chronic neck pain:  Ongoing for years, continuing to get worse. No current weakness or numbness in upper extremeties.  05/2020 Plain film unremarkable. Has see rheumatology Dr. Posey Pronto, in 10/2020 for arthralgia, felt isolated ANA positive unremarkable. Dx with cervical myofacial pain syndrome, likely carpal tunnel and right occipital neuralgia.  She does not want procedures but want to have further eval of what is going on.  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     Relevant past medical, surgical, family and social history reviewed and updated as indicated. Interim medical history since our last visit reviewed. Allergies and medications reviewed and updated. Outpatient Medications Prior to Visit  Medication Sig Dispense Refill  . cyclobenzaprine  (FLEXERIL) 10 MG tablet Take 10 mg by mouth as needed for muscle spasms.    . fluticasone (FLONASE) 50 MCG/ACT nasal spray Place 2 sprays into both nostrils daily. 16 g 5  . gabapentin (NEURONTIN) 100 MG capsule Take 1 capsule (100 mg total) by mouth 3 (three) times daily. 90 capsule 1  . Norgestimate-Ethinyl Estradiol Triphasic (TRI-SPRINTEC) 0.18/0.215/0.25 MG-35 MCG tablet Take 1 tablet by mouth daily. 3 Package 4  . sertraline (ZOLOFT) 50 MG tablet Take 1 tablet (50 mg total) by mouth daily. 30 tablet 3  . valACYclovir (VALTREX) 1000 MG tablet Take 1 tablet (1,000 mg total) by mouth daily. 90 tablet 4   No facility-administered medications prior to visit.     Per HPI unless specifically indicated in ROS section below Review of Systems  Constitutional: Negative for fatigue and fever.  HENT: Negative for ear pain.   Eyes: Negative for pain.  Respiratory: Negative for chest tightness and shortness of breath.   Cardiovascular: Negative for chest pain, palpitations and leg swelling.  Gastrointestinal: Negative for abdominal pain.  Genitourinary: Negative for dysuria.  Musculoskeletal: Positive for neck pain and neck stiffness.  Psychiatric/Behavioral: Positive for dysphoric mood and sleep disturbance. The patient is nervous/anxious.    Objective:  BP 130/90   Pulse 69   Temp 97.9 F (36.6 C) (Temporal)  Ht 5\' 5"  (1.651 m)   Wt 140 lb (63.5 kg)   LMP 11/04/2020 (Exact Date)   SpO2 99%   BMI 23.30 kg/m   Wt Readings from Last 3 Encounters:  11/27/20 140 lb (63.5 kg)  10/30/20 140 lb (63.5 kg)  06/05/20 146 lb 12 oz (66.6 kg)      Physical Exam Constitutional:      General: She is not in acute distress.Vital signs are normal.     Appearance: Normal appearance. She is well-developed and well-nourished. She is not ill-appearing or toxic-appearing.  HENT:     Head: Normocephalic.     Right Ear: Hearing, tympanic membrane, ear canal and external ear normal. Tympanic membrane is  not erythematous, retracted or bulging.     Left Ear: Hearing, tympanic membrane, ear canal and external ear normal. Tympanic membrane is not erythematous, retracted or bulging.     Nose: No mucosal edema or rhinorrhea.     Right Sinus: No maxillary sinus tenderness or frontal sinus tenderness.     Left Sinus: No maxillary sinus tenderness or frontal sinus tenderness.     Mouth/Throat:     Mouth: Oropharynx is clear and moist and mucous membranes are normal.     Pharynx: Uvula midline.  Eyes:     General: Lids are normal. Lids are everted, no foreign bodies appreciated.     Extraocular Movements: EOM normal.     Conjunctiva/sclera: Conjunctivae normal.     Pupils: Pupils are equal, round, and reactive to light.  Neck:     Thyroid: No thyroid mass or thyromegaly.     Vascular: No carotid bruit.     Trachea: Trachea normal.  Cardiovascular:     Rate and Rhythm: Normal rate and regular rhythm.     Pulses: Normal pulses and intact distal pulses.     Heart sounds: Normal heart sounds, S1 normal and S2 normal. No murmur heard. No friction rub. No gallop.   Pulmonary:     Effort: Pulmonary effort is normal. No tachypnea or respiratory distress.     Breath sounds: Normal breath sounds. No decreased breath sounds, wheezing, rhonchi or rales.  Abdominal:     General: Bowel sounds are normal.     Palpations: Abdomen is soft.     Tenderness: There is no abdominal tenderness.  Musculoskeletal:     Cervical back: Neck supple. Tenderness present. No erythema, rigidity, torticollis or bony tenderness. Pain with movement present. Decreased range of motion.     Thoracic back: Normal.     Lumbar back: Normal.  Skin:    General: Skin is warm, dry and intact.     Findings: No rash.  Neurological:     Mental Status: She is alert.     Cranial Nerves: Cranial nerves are intact.     Sensory: Sensation is intact.     Motor: Motor function is intact.     Coordination: Coordination is intact.   Psychiatric:        Mood and Affect: Mood is depressed. Mood is not anxious. Affect is flat.        Speech: Speech normal.        Behavior: Behavior normal. Behavior is cooperative.        Thought Content: Thought content normal.        Cognition and Memory: Cognition and memory normal.        Judgment: Judgment normal.       Results for orders placed or performed in visit  on 10/23/20  Comprehensive metabolic panel  Result Value Ref Range   Sodium 138 135 - 145 mEq/L   Potassium 4.0 3.5 - 5.1 mEq/L   Chloride 103 96 - 112 mEq/L   CO2 27 19 - 32 mEq/L   Glucose, Bld 86 70 - 99 mg/dL   BUN 13 6 - 23 mg/dL   Creatinine, Ser 1.61 0.40 - 1.20 mg/dL   Total Bilirubin 0.5 0.2 - 1.2 mg/dL   Alkaline Phosphatase 41 39 - 117 U/L   AST 15 0 - 37 U/L   ALT 13 0 - 35 U/L   Total Protein 7.0 6.0 - 8.3 g/dL   Albumin 4.1 3.5 - 5.2 g/dL   GFR 096.04 >54.09 mL/min   Calcium 9.7 8.4 - 10.5 mg/dL  Lipid panel  Result Value Ref Range   Cholesterol 186 0 - 200 mg/dL   Triglycerides 811.9 0.0 - 149.0 mg/dL   HDL 14.78 >29.56 mg/dL   VLDL 21.3 0.0 - 08.6 mg/dL   LDL Cholesterol 82 0 - 99 mg/dL   Total CHOL/HDL Ratio 2    NonHDL 110.25   Hepatitis C antibody  Result Value Ref Range   Hepatitis C Ab NON-REACTIVE NON-REACTI   SIGNAL TO CUT-OFF 0.00 <1.00    This visit occurred during the SARS-CoV-2 public health emergency.  Safety protocols were in place, including screening questions prior to the visit, additional usage of staff PPE, and extensive cleaning of exam room while observing appropriate contact time as indicated for disinfecting solutions.   COVID 19 screen:  No recent travel or known exposure to COVID19 The patient denies respiratory symptoms of COVID 19 at this time. The importance of social distancing was discussed today.   Assessment and Plan    Problem List Items Addressed This Visit    Cervical disc disease    Dx in past by Dr. Mauri Pole who has retired. Discussed eval  with MRi opr referral to back specialist for re-eval of cervical spine... she chose referral. She does not want to return to neuro Dr. Allena Katz as he stated the next step would be injections in neck.   Will also try increase of gabapentin as outline in patient instructions.      Relevant Orders   Ambulatory referral to Orthopedic Surgery   Chronic neck pain - Primary   Relevant Medications   sertraline (ZOLOFT) 100 MG tablet   Depression, major, single episode, moderate (HCC)    Worsened control... increase sertraline to 100 mg at bedtime. Follow up in 4 weeks.      Relevant Medications   sertraline (ZOLOFT) 100 MG tablet   GAD (generalized anxiety disorder)    Slightly improved but minimal.  Increase sertraline to 100 mg daily.      Relevant Medications   sertraline (ZOLOFT) 100 MG tablet   Positive ANA (antinuclear antibody)    Not clear associated with rheumatologic disease per Dr. Allena Katz Rheum OV.          Kerby Nora, MD

## 2020-11-27 NOTE — Assessment & Plan Note (Signed)
Worsened control... increase sertraline to 100 mg at bedtime. Follow up in 4 weeks.

## 2020-11-27 NOTE — Patient Instructions (Addendum)
Increase sertraline to 100 mg at bedtime.  Try increase in gabapentin to 100 mg in AM and 200 mg at night, if making groggy during the day.. can increase to 300 mg at night instead. We will work on referral to a back specialist for further evaluation and treatment.

## 2020-11-27 NOTE — Assessment & Plan Note (Addendum)
Dx in past by Dr. Mauri Pole who has retired. Discussed eval with MRi opr referral to back specialist for re-eval of cervical spine... she chose referral. She does not want to return to neuro Dr. Allena Katz as he stated the next step would be injections in neck.   Will also try increase of gabapentin as outline in patient instructions.

## 2020-12-21 ENCOUNTER — Other Ambulatory Visit: Payer: Self-pay | Admitting: Family Medicine

## 2021-01-28 ENCOUNTER — Other Ambulatory Visit: Payer: Self-pay

## 2021-01-28 ENCOUNTER — Ambulatory Visit: Payer: PRIVATE HEALTH INSURANCE | Attending: Neurology

## 2021-01-28 DIAGNOSIS — M6281 Muscle weakness (generalized): Secondary | ICD-10-CM | POA: Insufficient documentation

## 2021-01-28 DIAGNOSIS — G8929 Other chronic pain: Secondary | ICD-10-CM | POA: Insufficient documentation

## 2021-01-28 DIAGNOSIS — M545 Low back pain, unspecified: Secondary | ICD-10-CM | POA: Insufficient documentation

## 2021-01-28 NOTE — Therapy (Signed)
[facility] PH[facility] AND SPORTS MEDICINE [address] [place], [place], [address] Phone: [phone]   Fax:  [phone]  Physical Therapy Evaluation  Patient Details  Name: Carly Suarez MRN: [id number] Date of Birth: 10/26/1986 Referring Provider (PT): Lonell FaceShah, Hemang K, MD   Encounter Date: 01/28/2021   PT End of Session - 01/28/21 1724    Visit Number 1    Number of Visits 17    Date for PT Re-Evaluation 03/25/21    PT Start Time 1724    PT Stop Time 1836    PT Time Calculation (min) 72 min    Activity Tolerance Patient tolerated treatment well    Behavior [facility] for tasks assessed/performed           Past Medical History:  Diagnosis Date  . ADD (attention deficit disorder)   . History of chicken pox   . History of shingles     Past Surgical History:  Procedure Laterality Date  . LEEP N/A 06/30/2014   Procedure: LOOP ELECTROSURGICAL EXCISION PROCEDURE (LEEP);  Surgeon: Annamaria BootsMary Suzanne Miller, MD;  Location: [facility];  Service: Gynecology;  Laterality: N/A;  . WISDOM TOOTH EXTRACTION  2005    There were no vitals filed for this visit.    Subjective Assessment - 01/28/21 1728    Subjective Low back pain: 4/10 currently (pt sitting on a chair), 9/10 at most for the past 3 months (pt pushes through the pain)    Pertinent History Low back pain. Pain began years ago as long as she can remember. Pt got fed up with her back pain. Pt wears a uniform for work bothered her back and the belt she has to wear does not help.  Difficulty holding her bladder at times. Doctor does not know about it. Pt started having to wear liners recently (pt was recommended to tell her doctor about it. Pt verbalized understanding). Difficulty with bladder started within the last year. Back is progressively worsening. No known saddle anesthesia. No loss of strength from the waist down.    Patient Stated Goals Decrease pain. Be able stand for an extended period  of time.    Currently in Pain? Yes    Pain Score 4     Pain Orientation Posterior;Lower    Pain Descriptors / Indicators Sore;Pressure;Tightness;Sharp;Aching    Pain Type Chronic pain    Pain Radiating Towards B posterior hips and anterior hip around L1.    Pain Onset More than a month ago    Pain Frequency Occasional    Aggravating Factors  wearing her uniform (30 lbs including boots), bending over, leaning back, laying on her stomach (increases low back pressure). standing up from sitting (gets up very slow); prolonged standing (0-10 minutes).    Pain Relieving Factors not wearing the uniform. massage for pain management. Baclofen (muscle relaxer) since Saturday 01/23/21 and pt was not as sore getting out of bed.              [facility] - 01/28/21 1748      Assessment   Medical Diagnosis LBP    Referring Provider (PT) Lonell FaceShah, Hemang K, MD    Onset Date/Surgical Date 01/12/21    Prior Therapy None for back.      Precautions   Precaution Comments No known precautions      Restrictions   Other Position/Activity Restrictions No known restrictions      Balance Screen   Has the patient fallen  in the past 6 months No    Has the patient had a decrease in activity level because of a fear of falling?  No    Is the patient reluctant to leave their home because of a fear of falling?  No      Prior Function   Vocation Full time Presenter, broadcasting     Posture/Postural Control   Posture Comments Protracted neck, B protracted shoulders, R shoulder higher, R greater trochanter higher, B hip ER, L lateral shift, L lumbar rotation      AROM   Lumbar Flexion WFL with low back pain (like a band)    Lumbar Extension WFL wiht pain from T12 to sacrum (movement preference around T12/L1    Lumbar - Right Side Bend WFL    Lumbar - Left Side [facility] with L lumbar rotation and L low back pain at around L5    Lumbar - Right Rotation WFL with R low back pain    Lumbar - Left  Rotation WFL      PROM   Right Hip Internal Rotation  17    Left Hip Internal Rotation  16      Strength   Right Hip Flexion 4/5   with R TFL pain; decreased trunk control   Right Hip Extension 4/5    Right Hip ABduction 4/5    Left Hip Flexion 4/5   decreased trunk control   Left Hip Extension 4-/5    Left Hip ABduction 4/5    Right Knee Flexion 5/5    Right Knee Extension 5/5    Left Knee Flexion 4/5    Left Knee Extension 5/5      Special Tests   Other special tests (+) Long sit test suggesting anterior nutation of L inominate.      Ambulation/Gait   Gait Comments decreased stance R LE, B trendelenberg.   slight decrease in femoral control wiht stairs.                     Objective measurements completed on examination: See above findings.    No latex band allergy   Palpation: TTP R L5 transverse process in standing TTP L sacral Ala in prone Anterior L ASIS   Decreased B femoral control with stand to sit.  Difficulty with bladder started within the last year. Back is progressively worsening.   [place]    Therapeutic exercise  Seated manually resisted R lateral shift isometrics in neutral 10x5 second holds for 3 sets  No back pain in sitting afterwards  Seated hip extension isometrics   L 7x5 seconds. L low back/hip discomfort.   R 10x5 seconds. L lumbar paraspinal muscle discomfort, eases with rest.   Seated transversus abdominis contraction/naval ins 10x5 seconds for 2 sets   Improved exercise technique, movement at target joints, use of target muscles after mod verbal, visual, tactile cues.   Response to treatment Pt states back feels better after session   Clinical impression Pt is a 35 year old female who came to physical therapy secondary to low back pain. She also presents with L lateral shift, L lumbar rotation posture, bilateral hip weakness, decreased trunk strength and control, reproduction of symptoms with lumbar AROM, positive  special test suggesting lumbopelvic involvement, and difficulty performing tasks which involve bending over, leaning back as well as standing up from sitting and tolerating prolonged standing positions secondary to back pain. Pt will benefit from skilled physical therapy  services to address the aforementioned deficits.             PT Education - 01/28/21 1848    Education Details ther-ex, plan of care    Person(s) Educated Patient    Methods Explanation;Demonstration;Tactile cues;Verbal cues    Comprehension Verbalized understanding;Returned demonstration            PT Short Term Goals - 01/28/21 1852      PT SHORT TERM GOAL #1   Title Pt will be independent with her initial HEP to decrease pain, improve strength, and ability to perform work duties with less difficulty.    Time 3    Period Weeks    Status New    Target Date 02/18/21             PT Long Term Goals - 01/28/21 1853      PT LONG TERM GOAL #1   Title Pt will have a decrease in low back pain to 3/10 or less at worst to promote ability to perform work duties, wear her uniform, perform tasks which involve bending over as well as tolerate prolonged standing with less difficutly.    Baseline 9/10 low back pain at most for the past 3 months (01/28/2021)    Time 8    Period Weeks    Status New    Target Date 03/25/21      PT LONG TERM GOAL #2   Title Patient will improve bilateral hip extension and abduction strength by at least 1/2 MMT to promote ability to perform standing tasks with less back pain.    Baseline Hip extension 4/5 R, 4-/5 L, hip abduction 4/5 R and L (01/28/2021)    Time 8    Period Weeks    Status New    Target Date 03/25/21      PT LONG TERM GOAL #3   Title Patient will improve B hip IR ROM by at least 10 degrees to promote ability to perform standing tasks with less back pain.    Baseline Hip IR: 17 degrees R, 16 degrees L (01/28/2021)    Time 8    Period Weeks    Status New    Target  Date 03/25/21      PT LONG TERM GOAL #4   Title Patient will improve her Lumbar FOTO score by at least 10 points as a demonstration of improved function.    Baseline FOTO emailed to pt secondary to ipad internet difficulties. (01/28/2021)    Time 8    Period Weeks    Status New    Target Date 03/25/21                  Plan - 01/28/21 1849    Clinical Impression Statement Pt is a 35 year old female who came to physical therapy secondary to low back pain. She also presents with L lateral shift, L lumbar rotation posture, bilateral hip weakness, decreased trunk strength and control, reproduction of symptoms with lumbar AROM, positive special test suggesting lumbopelvic involvement, and difficulty performing tasks which involve bending over, leaning back as well as standing up from sitting and tolerating prolonged standing positions secondary to back pain. Pt will benefit from skilled physical therapy services to address the aforementioned deficits.    Personal Factors and Comorbidities Past/Current Experience;Profession;Time since onset of injury/illness/exacerbation    Examination-Activity Limitations Bed Mobility;Bend;Carry;Lift;Locomotion Level;Stand;Transfers    Stability/Clinical Decision Making Evolving/Moderate complexity   pain is worsening per pt.  Clinical Decision Making Moderate    Rehab Potential Fair    PT Frequency 2x / week    PT Duration 8 weeks    PT Treatment/Interventions Therapeutic exercise;Therapeutic activities;Neuromuscular re-education;Patient/family education;Manual techniques;Dry needling;Spinal Manipulations;Joint Manipulations;Aquatic Therapy;Biofeedback;Electrical Stimulation;Iontophoresis 4mg /ml Dexamethasone;Traction    PT Next Visit Plan posture, core, hip strength, femoral control, lumbopelvic control, manual techniques, modalities PRN    Consulted and Agree with Plan of Care Patient           Patient will benefit from skilled therapeutic  intervention in order to improve the following deficits and impairments:  Pain,Postural dysfunction,Improper body mechanics,Decreased strength  Visit Diagnosis: Chronic bilateral low back pain, unspecified whether sciatica present - Plan: PT plan of care cert/re-cert, PT plan of care cert/re-cert  Muscle weakness (generalized) - Plan: PT plan of care cert/re-cert, PT plan of care cert/re-cert     Problem List Patient Active Problem List   Diagnosis Date Noted  . Positive ANA (antinuclear antibody) 05/08/2020  . Chronic neck pain 05/08/2020  . Bilateral hand pain 04/28/2020  . Arthralgia 04/28/2020  . Vulvar cyst 05/13/2019  . Metatarsalgia of right foot 08/07/2018  . Depression, major, single episode, moderate (HCC) 04/20/2017  . GAD (generalized anxiety disorder) 04/20/2017  . Cervical disc disease 04/20/2017  . Abdominal pain, epigastric - episodic, ? gallstones 10/28/2015  . CIN III (cervical intraepithelial neoplasia III) 07/31/2014  . 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, [place] Phone: [phone]   Fax:  [phone]  Name: Carly Suarez MRN: Carly Suarez Date of Birth: May 07, 1986

## 2021-01-29 ENCOUNTER — Ambulatory Visit: Payer: PRIVATE HEALTH INSURANCE | Admitting: Family Medicine

## 2021-01-29 DIAGNOSIS — F988 Other specified behavioral and emotional disorders with onset usually occurring in childhood and adolescence: Secondary | ICD-10-CM

## 2021-01-29 DIAGNOSIS — F321 Major depressive disorder, single episode, moderate: Secondary | ICD-10-CM | POA: Diagnosis not present

## 2021-01-29 DIAGNOSIS — F411 Generalized anxiety disorder: Secondary | ICD-10-CM | POA: Diagnosis not present

## 2021-01-29 MED ORDER — DULOXETINE HCL 30 MG PO CPEP: ORAL_CAPSULE | ORAL | 3 refills | Status: DC

## 2021-01-29 NOTE — Assessment & Plan Note (Signed)
Dx as a child. Could possibly benefit from treatment if focus issues continuing.

## 2021-01-29 NOTE — Assessment & Plan Note (Signed)
Inadequate control. See note re: MDD

## 2021-01-29 NOTE — Assessment & Plan Note (Signed)
SE to sertraline at higher dose vs focus issues from ADD, vs due to poorly controlled anxiety.  Will D/C sertraline. Change to Cymbalta given additional possible help with chronic pain issues. Titrate up to 60 mg daily.

## 2021-01-29 NOTE — Progress Notes (Signed)
Patient ID: Carly Suarez, female    DOB: 05/19/1986, 35 y.o.   MRN: [id number]  This visit was conducted in person.  BP (!) 144/96   Pulse 79   Temp 98.3 F (36.8 C) (Temporal)   Ht 5\' 5"  (1.651 m)   Wt 140 lb (63.5 kg)   LMP 01/26/2021 (Exact Date)   SpO2 98%   BMI 23.30 kg/m    CC:  Chief Complaint  Patient presents with  . Medication Problem    Thinks zoloft is casuing focusing issues     Subjective:   HPI: Carly Suarez is a 35 y.o. female presenting on 01/29/2021 for Medication Problem (Thinks zoloft is casuing focusing issues )  At last OV  In 11/2020 sertraline increased to 100 mg daily for worsening depression and anxiety.  She has not noted any improvement in mood.  She has increased difficulty focusing on high dose as well.  GAD7: 10 PHQ9 9    Saw Dr. 12/2020 on 2/21 for headaches.. changed flexeril to baclofen. Started trigger point injections and occipital nerve block. She as noted baclofen makes her more tired, but it has improved her low back pain.  Hx ADD dx in 3rd grade.. was on ritalin in past.   Relevant past medical, surgical, family and social history reviewed and updated as indicated. Interim medical history since our last visit reviewed. Allergies and medications reviewed and updated. Outpatient Medications Prior to Visit  Medication Sig Dispense Refill  . baclofen (LIORESAL) 10 MG tablet Take by mouth.    . fluticasone (FLONASE) 50 MCG/ACT nasal spray Place 2 sprays into both nostrils daily. 16 g 5  . Norgestimate-Ethinyl Estradiol Triphasic (TRI-SPRINTEC) 0.18/0.215/0.25 MG-35 MCG tablet Take 1 tablet by mouth daily. 3 Package 4  . sertraline (ZOLOFT) 100 MG tablet TAKE 1 TABLET BY MOUTH EVERY DAY 90 tablet 1  . valACYclovir (VALTREX) 1000 MG tablet Take 1 tablet (1,000 mg total) by mouth daily. (Patient taking differently: Take 1,000 mg by mouth as needed.) 90 tablet 4  . cyclobenzaprine (FLEXERIL) 10 MG tablet Take 10 mg by mouth as needed for  muscle spasms. (Patient not taking: Reported on 01/28/2021)    . gabapentin (NEURONTIN) 100 MG capsule Take 1 capsule (100 mg total) by mouth 3 (three) times daily. (Patient not taking: Reported on 01/28/2021) 90 capsule 1   No facility-administered medications prior to visit.     Per HPI unless specifically indicated in ROS section below Review of Systems  Constitutional: Negative for fatigue and fever.  HENT: Negative for congestion.   Eyes: Negative for pain.  Respiratory: Negative for cough and shortness of breath.   Cardiovascular: Negative for chest pain, palpitations and leg swelling.  Gastrointestinal: Negative for abdominal pain.  Genitourinary: Negative for dysuria and vaginal bleeding.  Musculoskeletal: Positive for back pain, neck pain and neck stiffness.  Neurological: Positive for headaches. Negative for syncope and light-headedness.  Psychiatric/Behavioral: Positive for decreased concentration and dysphoric mood. Negative for self-injury and sleep disturbance. The patient is nervous/anxious.    Objective:  BP (!) 144/96   Pulse 79   Temp 98.3 F (36.8 C) (Temporal)   Ht 5\' 5"  (1.651 m)   Wt 140 lb (63.5 kg)   LMP 01/26/2021 (Exact Date)   SpO2 98%   BMI 23.30 kg/m   Wt Readings from Last 3 Encounters:  01/29/21 140 lb (63.5 kg)  11/27/20 140 lb (63.5 kg)  10/30/20 140 lb (63.5 kg)  Physical Exam Constitutional:      General: She is not in acute distress.    Appearance: Normal appearance. She is well-developed. She is not ill-appearing or toxic-appearing.  HENT:     Head: Normocephalic.     Right Ear: Hearing, tympanic membrane, ear canal and external ear normal. Tympanic membrane is not erythematous, retracted or bulging.     Left Ear: Hearing, tympanic membrane, ear canal and external ear normal. Tympanic membrane is not erythematous, retracted or bulging.     Nose: No mucosal edema or rhinorrhea.     Right Sinus: No maxillary sinus tenderness or frontal  sinus tenderness.     Left Sinus: No maxillary sinus tenderness or frontal sinus tenderness.     Mouth/Throat:     Pharynx: Uvula midline.  Eyes:     General: Lids are normal. Lids are everted, no foreign bodies appreciated.     Conjunctiva/sclera: Conjunctivae normal.     Pupils: Pupils are equal, round, and reactive to light.  Neck:     Thyroid: No thyroid mass or thyromegaly.     Vascular: No carotid bruit.     Trachea: Trachea normal.  Cardiovascular:     Rate and Rhythm: Normal rate and regular rhythm.     Pulses: Normal pulses.     Heart sounds: Normal heart sounds, S1 normal and S2 normal. No murmur heard. No friction rub. No gallop.   Pulmonary:     Effort: Pulmonary effort is normal. No tachypnea or respiratory distress.     Breath sounds: Normal breath sounds. No decreased breath sounds, wheezing, rhonchi or rales.  Abdominal:     General: Bowel sounds are normal.     Palpations: Abdomen is soft.     Tenderness: There is no abdominal tenderness.  Musculoskeletal:     Cervical back: Normal range of motion and neck supple.  Skin:    General: Skin is warm and dry.     Findings: No rash.  Neurological:     Mental Status: She is alert.  Psychiatric:        Attention and Perception: Attention normal.        Mood and Affect: Mood is depressed. Affect is flat.        Speech: Speech normal.        Behavior: Behavior normal. Behavior is cooperative.        Thought Content: Thought content normal.        Judgment: Judgment normal.       Results for orders placed or performed in visit on 10/23/20  Comprehensive metabolic panel  Result Value Ref Range   Sodium 138 135 - 145 mEq/L   Potassium 4.0 3.5 - 5.1 mEq/L   Chloride 103 96 - 112 mEq/L   CO2 27 19 - 32 mEq/L   Glucose, Bld 86 70 - 99 mg/dL   BUN 13 6 - 23 mg/dL   Creatinine, Ser 9.50 0.40 - 1.20 mg/dL   Total Bilirubin 0.5 0.2 - 1.2 mg/dL   Alkaline Phosphatase 41 39 - 117 U/L   AST 15 0 - 37 U/L   ALT 13 0 -  35 U/L   Total Protein 7.0 6.0 - 8.3 g/dL   Albumin 4.1 3.5 - 5.2 g/dL   GFR 932.67 >12.45 mL/min   Calcium 9.7 8.4 - 10.5 mg/dL  Lipid panel  Result Value Ref Range   Cholesterol 186 0 - 200 mg/dL   Triglycerides 809.9 0.0 - 149.0 mg/dL   HDL  76.10 >39.00 mg/dL   VLDL 54.5 0.0 - 62.5 mg/dL   LDL Cholesterol 82 0 - 99 mg/dL   Total CHOL/HDL Ratio 2    NonHDL 110.25   Hepatitis C antibody  Result Value Ref Range   Hepatitis C Ab NON-REACTIVE NON-REACTI   SIGNAL TO CUT-OFF 0.00 <1.00    This visit occurred during the SARS-CoV-2 public health emergency.  Safety protocols were in place, including screening questions prior to the visit, additional usage of staff PPE, and extensive cleaning of exam room while observing appropriate contact time as indicated for disinfecting solutions.   COVID 19 screen:  No recent travel or known exposure to COVID19 The patient denies respiratory symptoms of COVID 19 at this time. The importance of social distancing was discussed today.   Assessment and Plan Problem List Items Addressed This Visit    ADD (attention deficit disorder)    Dx as a child. Could possibly benefit from treatment if focus issues continuing.       Depression, major, single episode, moderate (HCC)    SE to sertraline at higher dose vs focus issues from ADD, vs due to poorly controlled anxiety.  Will D/C sertraline. Change to Cymbalta given additional possible help with chronic pain issues. Titrate up to 60 mg daily.        Relevant Medications   DULoxetine (CYMBALTA) 30 MG capsule   GAD (generalized anxiety disorder)    Inadequate control. See note re: MDD      Relevant Medications   DULoxetine (CYMBALTA) 30 MG capsule         Kerby Nora, MD

## 2021-01-29 NOTE — Patient Instructions (Addendum)
Stop sertraline. Start Cymbalta 30 mg daily x 1 week, if no Side effects increase to 60 mg daily.  May need to address ADD if focus not improving.

## 2021-02-01 ENCOUNTER — Ambulatory Visit: Payer: PRIVATE HEALTH INSURANCE

## 2021-02-01 ENCOUNTER — Other Ambulatory Visit: Payer: Self-pay

## 2021-02-01 DIAGNOSIS — M545 Low back pain, unspecified: Secondary | ICD-10-CM | POA: Diagnosis not present

## 2021-02-01 DIAGNOSIS — M6281 Muscle weakness (generalized): Secondary | ICD-10-CM

## 2021-02-01 DIAGNOSIS — G8929 Other chronic pain: Secondary | ICD-10-CM

## 2021-02-01 NOTE — Patient Instructions (Signed)
Access Code: [email or web address] URL: [email or web address] Date: 02/01/2021 Prepared by: Loralyn Freshwater  Exercises Supine Transversus Abdominis Bracing - Hands on Ground - 1 x daily - 7 x weekly - 3 sets - 10 reps - 5 hold

## 2021-02-01 NOTE — Therapy (Signed)
[facility] [address], [place], [address] Phone: [phone]   Fax:  [phone]  Physical Therapy Treatment  Patient Details  Name: Carly Suarez MRN: [id number] Date of Birth: 1986/09/12 Referring Provider (PT): Lonell Face, MD   Encounter Date: 02/01/2021   PT End of Session - 02/01/21 1805    Visit Number 2    Number of Visits 17    Date for PT Re-Evaluation 03/25/21    PT Start Time 1805    PT Stop Time 1848    PT Time Calculation (min) 43 min    Activity Tolerance Patient tolerated treatment well    Behavior During Therapy [facility] for tasks assessed/performed           Past Medical History:  Diagnosis Date  . ADD (attention deficit disorder)   . History of chicken pox   . History of shingles     Past Surgical History:  Procedure Laterality Date  . LEEP N/A 06/30/2014   Procedure: LOOP ELECTROSURGICAL EXCISION PROCEDURE (LEEP);  Surgeon: Annamaria Boots, MD;  Location: [facility];  Service: Gynecology;  Laterality: N/A;  . WISDOM TOOTH EXTRACTION  2005    There were no vitals filed for this visit.   Subjective Assessment - 02/01/21 1806    Subjective 4/10 low back pain currently.    Pertinent History Low back pain. Pain began years ago as long as she can remember. Pt got fed up with her back pain. Pt wears a uniform for work bothered her back and the belt she has to wear does not help.  Difficulty holding her bladder at times. Doctor does not know about it. Pt started having to wear liners recently (pt was recommended to tell her doctor about it. Pt verbalized understanding). Difficulty with bladder started within the last year. Back is progressively worsening. No known saddle anesthesia. No loss of strength from the waist down.    Patient Stated Goals Decrease pain. Be able stand for an extended period of time.    Currently in Pain? Yes    Pain Score 4     Pain Onset More than a month ago                                      PT Education - 02/01/21 1839    Education Details ther-ex, HEP    Person(s) Educated Patient    Methods Explanation;Demonstration;Tactile cues;Verbal cues;Handout    Comprehension Returned demonstration;Verbalized understanding           Objective   [email or web address]  No latex band allergy Palpation: TTP R L5 transverse process in standing TTP L sacral Ala in prone Anterior L ASIS   Decreased B femoral control with stand to sit.  Difficulty with bladder started within the last year. Back is progressively worsening.   [facility] Access Code [email or web address]   Therapeutic exercise  hooklying transversus abdominis 10x3 with 5 second holds   hooklying B shoulder flexion 10x2 with 5 second holds to promote thoracic extension and gentle lumbar extension. B shoulder soreness  Bridge 10x5 seconds for 2 sets  Supine hip fallout 10x   S/L clamshell  R 10x  L 10x  No back pain in supine after aforementioned exercises.   Prone on elbows with deep breaths 45 seconds to 1 min. Low back pain which eases quickly with rest. Then 30  seconds   Prone quad/glute set 10x5 seconds for 2 sets alternating.  R low back discomfort afterwards     Improved exercise technique, movement at target joints, use of target muscles after mod verbal, visual, tactile cues.   Response to treatment Fair tolerance to today's session.   Clinical impression Worked on gentle extension secondary to possible extension preference at times. Also worked on trunk strengthening and lumbopelvic control to help decrease stress to low back. Demonstrates decreased trunk control with L hip fallout. Fair tolerance to today's session. Pt will benefit from continued skilled physical therapy services to decrease pain, improve strength and function.        PT Short Term Goals - 01/28/21 1852      PT SHORT TERM GOAL #1   Title Pt will be independent  with her initial HEP to decrease pain, improve strength, and ability to perform work duties with less difficulty.    Time 3    Period Weeks    Status New    Target Date 02/18/21             PT Long Term Goals - 01/28/21 1853      PT LONG TERM GOAL #1   Title Pt will have a decrease in low back pain to 3/10 or less at worst to promote ability to perform work duties, wear her uniform, perform tasks which involve bending over as well as tolerate prolonged standing with less difficutly.    Baseline 9/10 low back pain at most for the past 3 months (01/28/2021)    Time 8    Period Weeks    Status New    Target Date 03/25/21      PT LONG TERM GOAL #2   Title Patient will improve bilateral hip extension and abduction strength by at least 1/2 MMT to promote ability to perform standing tasks with less back pain.    Baseline Hip extension 4/5 R, 4-/5 L, hip abduction 4/5 R and L (01/28/2021)    Time 8    Period Weeks    Status New    Target Date 03/25/21      PT LONG TERM GOAL #3   Title Patient will improve B hip IR ROM by at least 10 degrees to promote ability to perform standing tasks with less back pain.    Baseline Hip IR: 17 degrees R, 16 degrees L (01/28/2021)    Time 8    Period Weeks    Status New    Target Date 03/25/21      PT LONG TERM GOAL #4   Title Patient will improve her Lumbar FOTO score by at least 10 points as a demonstration of improved function.    Baseline FOTO emailed to pt secondary to ipad internet difficulties. (01/28/2021)    Time 8    Period Weeks    Status New    Target Date 03/25/21                 Plan - 02/01/21 1843    Clinical Impression Statement Worked on gentle extension secondary to possible extension preference at times. Also worked on trunk strengthening and lumbopelvic control to help decrease stress to low back. Demonstrates decreased trunk control with L hip fallout. Fair tolerance to today's session. Pt will benefit from continued  skilled physical therapy services to decrease pain, improve strength and function.    Personal Factors and Comorbidities Past/Current Experience;Profession;Time since onset of injury/illness/exacerbation    Examination-Activity Limitations  Bed Mobility;Bend;Carry;Lift;Locomotion Level;Stand;Transfers    Stability/Clinical Decision Making Evolving/Moderate complexity   pain is worsening per pt.   Rehab Potential Fair    PT Frequency 2x / week    PT Duration 8 weeks    PT Treatment/Interventions Therapeutic exercise;Therapeutic activities;Neuromuscular re-education;Patient/family education;Manual techniques;Dry needling;Spinal Manipulations;Joint Manipulations;Aquatic Therapy;Biofeedback;Electrical Stimulation;Iontophoresis 4mg /ml Dexamethasone;Traction    PT Next Visit Plan posture, core, hip strength, femoral control, lumbopelvic control, manual techniques, modalities PRN    Consulted and Agree with Plan of Care Patient           Patient will benefit from skilled therapeutic intervention in order to improve the following deficits and impairments:  Pain,Postural dysfunction,Improper body mechanics,Decreased strength  Visit Diagnosis: Chronic bilateral low back pain, unspecified whether sciatica present  Muscle weakness (generalized)     Problem List Patient Active Problem List   Diagnosis Date Noted  . ADD (attention deficit disorder) 01/29/2021  . Positive ANA (antinuclear antibody) 05/08/2020  . Chronic neck pain 05/08/2020  . Bilateral hand pain 04/28/2020  . Arthralgia 04/28/2020  . Vulvar cyst 05/13/2019  . Metatarsalgia of right foot 08/07/2018  . Depression, major, single episode, moderate (HCC) 04/20/2017  . GAD (generalized anxiety disorder) 04/20/2017  . Cervical disc disease 04/20/2017  . Abdominal pain, epigastric - episodic, ? gallstones 10/28/2015  . CIN III (cervical intraepithelial neoplasia III) 07/31/2014  . 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 PHYSICAL AND SPORTS MEDICINE [address], [place] Phone: [phone]   Fax:  [phone]  Name: Carly Suarez MRN: [name] Date of Birth: [birth year]

## 2021-02-03 ENCOUNTER — Ambulatory Visit: Payer: PRIVATE HEALTH INSURANCE

## 2021-02-09 ENCOUNTER — Ambulatory Visit: Payer: PRIVATE HEALTH INSURANCE

## 2021-02-09 ENCOUNTER — Other Ambulatory Visit: Payer: Self-pay

## 2021-02-09 DIAGNOSIS — M545 Low back pain, unspecified: Secondary | ICD-10-CM

## 2021-02-09 DIAGNOSIS — M6281 Muscle weakness (generalized): Secondary | ICD-10-CM

## 2021-02-09 DIAGNOSIS — G8929 Other chronic pain: Secondary | ICD-10-CM

## 2021-02-09 NOTE — Therapy (Signed)
[facility]ORTS MEDICINE [address] [place], [address] Phone: [phone]   Fax:  [phone]  Physical Therapy Treatment  Patient Details  Name: Carly Suarez MRN: [id number] Date of Birth: [birth year] Referring Provider (PT): Lonell Face, MD   Encounter Date: 02/09/2021   PT End of Session - 02/09/21 1716    Visit Number 3    Number of Visits 17    Date for PT Re-Evaluation 03/25/21    PT Start Time 1716    PT Stop Time 1756    PT Time Calculation (min) 40 min    Activity Tolerance Patient tolerated treatment well    Behavior During Therapy [facility] for tasks assessed/performed           Past Medical History:  Diagnosis Date  . ADD (attention deficit disorder)   . History of chicken pox   . History of shingles     Past Surgical History:  Procedure Laterality Date  . LEEP N/A 06/30/2014   Procedure: LOOP ELECTROSURGICAL EXCISION PROCEDURE (LEEP);  Surgeon: Annamaria Boots, MD;  Location: [facility];  Service: Gynecology;  Laterality: N/A;  . WISDOM TOOTH EXTRACTION  2005    There were no vitals filed for this visit.   Subjective Assessment - 02/09/21 1718    Subjective Back ok, minimal pain currently, 3/10. Was sore after last session but not bad.    Pertinent History Low back pain. Pain began years ago as long as she can remember. Pt got fed up with her back pain. Pt wears a uniform for work bothered her back and the belt she has to wear does not help.  Difficulty holding her bladder at times. Doctor does not know about it. Pt started having to wear liners recently (pt was recommended to tell her doctor about it. Pt verbalized understanding). Difficulty with bladder started within the last year. Back is progressively worsening. No known saddle anesthesia. No loss of strength from the waist down.    Patient Stated Goals Decrease pain. Be able stand for an extended period of time.    Currently in Pain? Yes     Pain Score 3     Pain Onset More than a month ago                                     PT Education - 02/09/21 1742    Education Details ther-ex    Person(s) Educated Patient    Methods Explanation;Demonstration;Tactile cues;Verbal cues    Comprehension Returned demonstration;Verbalized understanding          Objective   [email or web address]  No latex band allergy Palpation: TTP R L5 transverse process in standing TTP L sacral Ala in prone Anterior L ASIS   Decreased B femoral control with stand to sit.  Difficulty with bladder started within the last year. Back is progressively worsening.   [facility] Access Code [email or web address]  Manual therapy  hooklying manual lumbar traction  Decreased low back pain   Therapeutic exercise  Hooklying posterior pelvic tilt 10x5 seconds for 3 sets  Supine hip fallout 10x. Increased time secondary to emphasis on proper technique. Difficulty for L LE compared to R LE.   Bridge 10x5 seconds for 2 sets  hooklying hip adduction ball and glute max squeeze 10x5 seconds for 2 sets  Crunches   Forward 10x  R 10x  L 10x  hooklying lower abdominal raise 10x  Improved exercise technique, movement at target joints, use of target muscles after mod verbal, visual, tactile cues.  Response to treatment Decreased back pain reported after session.   Clinical impression Worked on manual lumbar traction to decrease pressure to low back, followed by abdominal strengthening to promote control of lumbar spine during movement. Decreased back pain after session. Pt will benefit from continued skilled physical therapy services to decrease pain, improve strength and function.     PT Short Term Goals - 01/28/21 1852      PT SHORT TERM GOAL #1   Title Pt will be independent with her initial HEP to decrease pain, improve strength, and ability to perform work duties with less difficulty.    Time 3    Period Weeks     Status New    Target Date 02/18/21             PT Long Term Goals - 01/28/21 1853      PT LONG TERM GOAL #1   Title Pt will have a decrease in low back pain to 3/10 or less at worst to promote ability to perform work duties, wear her uniform, perform tasks which involve bending over as well as tolerate prolonged standing with less difficutly.    Baseline 9/10 low back pain at most for the past 3 months (01/28/2021)    Time 8    Period Weeks    Status New    Target Date 03/25/21      PT LONG TERM GOAL #2   Title Patient will improve bilateral hip extension and abduction strength by at least 1/2 MMT to promote ability to perform standing tasks with less back pain.    Baseline Hip extension 4/5 R, 4-/5 L, hip abduction 4/5 R and L (01/28/2021)    Time 8    Period Weeks    Status New    Target Date 03/25/21      PT LONG TERM GOAL #3   Title Patient will improve B hip IR ROM by at least 10 degrees to promote ability to perform standing tasks with less back pain.    Baseline Hip IR: 17 degrees R, 16 degrees L (01/28/2021)    Time 8    Period Weeks    Status New    Target Date 03/25/21      PT LONG TERM GOAL #4   Title Patient will improve her Lumbar FOTO score by at least 10 points as a demonstration of improved function.    Baseline FOTO emailed to pt secondary to ipad internet difficulties. (01/28/2021)    Time 8    Period Weeks    Status New    Target Date 03/25/21                 Plan - 02/09/21 1742    Clinical Impression Statement Worked on manual lumbar traction to decrease pressure to low back, followed by abdominal strengthening to promote control of lumbar spine during movement. Decreased back pain after session. Pt will benefit from continued skilled physical therapy services to decrease pain, improve strength and function.    Personal Factors and Comorbidities Past/Current Experience;Profession;Time since onset of injury/illness/exacerbation     Examination-Activity Limitations Bed Mobility;Bend;Carry;Lift;Locomotion Level;Stand;Transfers    Stability/Clinical Decision Making Evolving/Moderate complexity   pain is worsening per pt.   Rehab Potential Fair    PT Frequency 2x / week    PT Duration 8 weeks  PT Treatment/Interventions Therapeutic exercise;Therapeutic activities;Neuromuscular re-education;Patient/family education;Manual techniques;Dry needling;Spinal Manipulations;Joint Manipulations;Aquatic Therapy;Biofeedback;Electrical Stimulation;Iontophoresis 4mg /ml Dexamethasone;Traction    PT Next Visit Plan posture, core, hip strength, femoral control, lumbopelvic control, manual techniques, modalities PRN    Consulted and Agree with Plan of Care Patient           Patient will benefit from skilled therapeutic intervention in order to improve the following deficits and impairments:  Pain,Postural dysfunction,Improper body mechanics,Decreased strength  Visit Diagnosis: Chronic bilateral low back pain, unspecified whether sciatica present  Muscle weakness (generalized)     Problem List Patient Active Problem List   Diagnosis Date Noted  . ADD (attention deficit disorder) 01/29/2021  . Positive ANA (antinuclear antibody) 05/08/2020  . Chronic neck pain 05/08/2020  . Bilateral hand pain 04/28/2020  . Arthralgia 04/28/2020  . Vulvar cyst 05/13/2019  . Metatarsalgia of right foot 08/07/2018  . Depression, major, single episode, moderate (HCC) 04/20/2017  . GAD (generalized anxiety disorder) 04/20/2017  . Cervical disc disease 04/20/2017  . Abdominal pain, epigastric - episodic, ? gallstones 10/28/2015  . CIN III (cervical intraepithelial neoplasia III) 07/31/2014  . 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, [place] Phone: [phone]   Fax:   [phone]  Name: Carly Suarez MRN: [name] Date of Birth: 07-May-1986

## 2021-02-09 NOTE — Patient Instructions (Signed)
Access Code: [email or web address] URL: [email or web address] Date: 02/09/2021 Prepared by: Loralyn Freshwater  Exercises Supine Transversus Abdominis Bracing - Hands on Ground - 1 x daily - 7 x weekly - 3 sets - 10 reps - 5 hold Curl Up with Arms Crossed - 1 x daily - 7 x weekly - 3 sets - 10 reps

## 2021-02-11 ENCOUNTER — Other Ambulatory Visit: Payer: Self-pay

## 2021-02-11 ENCOUNTER — Ambulatory Visit: Payer: PRIVATE HEALTH INSURANCE

## 2021-02-11 DIAGNOSIS — G8929 Other chronic pain: Secondary | ICD-10-CM

## 2021-02-11 DIAGNOSIS — M6281 Muscle weakness (generalized): Secondary | ICD-10-CM

## 2021-02-11 DIAGNOSIS — M545 Low back pain, unspecified: Secondary | ICD-10-CM | POA: Diagnosis not present

## 2021-02-11 NOTE — Therapy (Signed)
[facility] [address], [place], [address] Phone: [phone]   Fax:  [phone]  Physical Therapy Treatment  Patient Details  Name: Carly Suarez MRN: [id number] Date of Birth: 28-Dec-1985 Referring Provider (PT): Lonell Face, MD   Encounter Date: 02/11/2021   PT End of Session - 02/11/21 1720    Visit Number 4    Number of Visits 17    Date for PT Re-Evaluation 03/25/21    PT Start Time 1720    PT Stop Time 1807    PT Time Calculation (min) 47 min    Activity Tolerance Patient tolerated treatment well    Behavior During Therapy [facility] for tasks assessed/performed           Past Medical History:  Diagnosis Date  . ADD (attention deficit disorder)   . History of chicken pox   . History of shingles     Past Surgical History:  Procedure Laterality Date  . LEEP N/A 06/30/2014   Procedure: LOOP ELECTROSURGICAL EXCISION PROCEDURE (LEEP);  Surgeon: Annamaria Boots, MD;  Location: [facility];  Service: Gynecology;  Laterality: N/A;  . WISDOM TOOTH EXTRACTION  2005    There were no vitals filed for this visit.   Subjective Assessment - 02/11/21 1721    Subjective Back is ok right now. Back was really sore all day yesterday (like a band). Feels much better today. No pain currently. Worked today and yesterday.    Pertinent History Low back pain. Pain began years ago as long as she can remember. Pt got fed up with her back pain. Pt wears a uniform for work bothered her back and the belt she has to wear does not help.  Difficulty holding her bladder at times. Doctor does not know about it. Pt started having to wear liners recently (pt was recommended to tell her doctor about it. Pt verbalized understanding). Difficulty with bladder started within the last year. Back is progressively worsening. No known saddle anesthesia. No loss of strength from the waist down.    Patient Stated Goals Decrease pain. Be able  stand for an extended period of time.    Currently in Pain? No/denies    Pain Score 0-No pain    Pain Onset More than a month ago              [facility] PT Assessment - 02/11/21 1743      Special Tests   Other special tests Long sit suggests anterior nutation L innominate.                                 PT Education - 02/11/21 1749    Education Details ther-ex    Person(s) Educated Patient    Methods Explanation;Demonstration;Tactile cues;Verbal cues    Comprehension Returned demonstration;Verbalized understanding          Objective  [email or web address]  No latex band allergy Palpation: TTP R L5 transverse process in standing TTP L sacral Ala in prone Anterior L ASIS   Decreased B femoral control with stand to sit.  Difficulty with bladder started within the last year. Back is progressively worsening.   MedbdridgeAccess Code [email or web address]  Manual therapy  hooklying manual lumbar traction  Decreased low back pain   Therapeutic exercise  Supine hip fallout 10x. Increased time secondary to emphasis on proper technique. Difficulty for L LE compared to R LE.  Long sit suggests anterior nutation L innominate.   Supine SLR hip flexion   R 10x3  Supine hip extension isometrics with L leg straight 10x3 with 5 second holds   Supine L hip extension isometrics in the SKTC position 10x5 seconds for 2 sets  Bridge 10x5 seconds for 2 sets  hooklying hip adduction ball and glute max squeeze 10x5 seconds for 2 sets    Improved exercise technique, movement at target joints, use of target muscles after mod verbal, visual, tactile cues.  Response to treatment No back pain after session   Clinical impression Good carry over of decreased back pain from previous session. Continued working on decreasing compression pressure on low back as well as improving core and glute strength and improving lumbopelvic posture.  Pt tolerated session  well without aggravation of symptoms. Pt will benefit from continued skilled physical therapy services to decrease pain, improve strength and function.         PT Short Term Goals - 01/28/21 1852      PT SHORT TERM GOAL #1   Title Pt will be independent with her initial HEP to decrease pain, improve strength, and ability to perform work duties with less difficulty.    Time 3    Period Weeks    Status New    Target Date 02/18/21             PT Long Term Goals - 01/28/21 1853      PT LONG TERM GOAL #1   Title Pt will have a decrease in low back pain to 3/10 or less at worst to promote ability to perform work duties, wear her uniform, perform tasks which involve bending over as well as tolerate prolonged standing with less difficutly.    Baseline 9/10 low back pain at most for the past 3 months (01/28/2021)    Time 8    Period Weeks    Status New    Target Date 03/25/21      PT LONG TERM GOAL #2   Title Patient will improve bilateral hip extension and abduction strength by at least 1/2 MMT to promote ability to perform standing tasks with less back pain.    Baseline Hip extension 4/5 R, 4-/5 L, hip abduction 4/5 R and L (01/28/2021)    Time 8    Period Weeks    Status New    Target Date 03/25/21      PT LONG TERM GOAL #3   Title Patient will improve B hip IR ROM by at least 10 degrees to promote ability to perform standing tasks with less back pain.    Baseline Hip IR: 17 degrees R, 16 degrees L (01/28/2021)    Time 8    Period Weeks    Status New    Target Date 03/25/21      PT LONG TERM GOAL #4   Title Patient will improve her Lumbar FOTO score by at least 10 points as a demonstration of improved function.    Baseline FOTO emailed to pt secondary to ipad internet difficulties. (01/28/2021)    Time 8    Period Weeks    Status New    Target Date 03/25/21                 Plan - 02/11/21 1754    Clinical Impression Statement Good carry over of decreased back  pain from previous session. Continued working on decreasing compression pressure on low back as well as improving  core and glute strength and improving lumbopelvic posture.  Pt tolerated session well without aggravation of symptoms. Pt will benefit from continued skilled physical therapy services to decrease pain, improve strength and function.    Personal Factors and Comorbidities Past/Current Experience;Profession;Time since onset of injury/illness/exacerbation    Examination-Activity Limitations Bed Mobility;Bend;Carry;Lift;Locomotion Level;Stand;Transfers    Stability/Clinical Decision Making Stable/Uncomplicated   pain is worsening per pt.   Clinical Decision Making Low    Rehab Potential Fair    PT Frequency 2x / week    PT Duration 8 weeks    PT Treatment/Interventions Therapeutic exercise;Therapeutic activities;Neuromuscular re-education;Patient/family education;Manual techniques;Dry needling;Spinal Manipulations;Joint Manipulations;Aquatic Therapy;Biofeedback;Electrical Stimulation;Iontophoresis 4mg /ml Dexamethasone;Traction    PT Next Visit Plan posture, core, hip strength, femoral control, lumbopelvic control, manual techniques, modalities PRN    Consulted and Agree with Plan of Care Patient           Patient will benefit from skilled therapeutic intervention in order to improve the following deficits and impairments:  Pain,Postural dysfunction,Improper body mechanics,Decreased strength  Visit Diagnosis: Chronic bilateral low back pain, unspecified whether sciatica present  Muscle weakness (generalized)     Problem List Patient Active Problem List   Diagnosis Date Noted  . ADD (attention deficit disorder) 01/29/2021  . Positive ANA (antinuclear antibody) 05/08/2020  . Chronic neck pain 05/08/2020  . Bilateral hand pain 04/28/2020  . Arthralgia 04/28/2020  . Vulvar cyst 05/13/2019  . Metatarsalgia of right foot 08/07/2018  . Depression, major, single episode, moderate  (HCC) 04/20/2017  . GAD (generalized anxiety disorder) 04/20/2017  . Cervical disc disease 04/20/2017  . Abdominal pain, epigastric - episodic, ? gallstones 10/28/2015  . CIN III (cervical intraepithelial neoplasia III) 07/31/2014  . HSV-2 (herpes simplex virus 2) infection 05/14/2014    05/16/2014 PT, DPT   02/11/2021, 6:12 PM  [facility] [address], [address], [place] Phone: [phone]   Fax:  [phone]  Name: Carly Suarez MRN: [name] Date of Birth: [birth year]

## 2021-02-15 ENCOUNTER — Other Ambulatory Visit: Payer: Self-pay

## 2021-02-15 ENCOUNTER — Ambulatory Visit: Payer: PRIVATE HEALTH INSURANCE

## 2021-02-15 DIAGNOSIS — M6281 Muscle weakness (generalized): Secondary | ICD-10-CM

## 2021-02-15 DIAGNOSIS — G8929 Other chronic pain: Secondary | ICD-10-CM

## 2021-02-15 DIAGNOSIS — M545 Low back pain, unspecified: Secondary | ICD-10-CM | POA: Diagnosis not present

## 2021-02-15 NOTE — Therapy (Signed)
[facility] [address], [place], [address] Phone: [phone]   Fax:  [phone]  Physical Therapy Treatment  Patient Details  Name: Carly Suarez MRN: [id number] Date of Birth: 30-Jun-1986 Referring Provider (PT): Lonell Face, MD   Encounter Date: 02/15/2021   PT End of Session - 02/15/21 1802    Visit Number 5    Number of Visits 17    Date for PT Re-Evaluation 03/25/21    PT Start Time 1802    PT Stop Time 1848    PT Time Calculation (min) 46 min    Activity Tolerance Patient tolerated treatment well    Behavior During Therapy [facility] for tasks assessed/performed           Past Medical History:  Diagnosis Date  . ADD (attention deficit disorder)   . History of chicken pox   . History of shingles     Past Surgical History:  Procedure Laterality Date  . LEEP N/A 06/30/2014   Procedure: LOOP ELECTROSURGICAL EXCISION PROCEDURE (LEEP);  Surgeon: Annamaria Boots, MD;  Location: [facility];  Service: Gynecology;  Laterality: N/A;  . WISDOM TOOTH EXTRACTION  2005    There were no vitals filed for this visit.   Subjective Assessment - 02/15/21 1803    Subjective Back sucks. 6/10 currently. Worked today. Did not wear her gear. Has been hurting since last session. Does not know what irritated it. Getting corisone injections for her neck this Friday 02/19/2021.    Pertinent History Low back pain. Pain began years ago as long as she can remember. Pt got fed up with her back pain. Pt wears a uniform for work bothered her back and the belt she has to wear does not help.  Difficulty holding her bladder at times. Doctor does not know about it. Pt started having to wear liners recently (pt was recommended to tell her doctor about it. Pt verbalized understanding). Difficulty with bladder started within the last year. Back is progressively worsening. No known saddle anesthesia. No loss of strength from the waist down.     Patient Stated Goals Decrease pain. Be able stand for an extended period of time.    Currently in Pain? Yes    Pain Score 6     Pain Onset More than a month ago                                     PT Education - 02/15/21 1818    Education Details ther-ex    Person(s) Educated Patient    Methods Explanation;Demonstration;Tactile cues;Verbal cues    Comprehension Returned demonstration;Verbalized understanding           Objective  [email or web address]  No latex band allergy Palpation: TTP R L5 transverse process in standing TTP L sacral Ala in prone Anterior L ASIS   Decreased B femoral control with stand to sit.  Difficulty with bladder started within the last year. Back is progressively worsening.   MedbdridgeAccess Code [email or web address]  Pt states doing gymnastics and dance when she was a kid   Manual therapy  hooklying manual lumbar traction     Therapeutic exercise Hooklying posterior pelvic tilt 10x10 seconds  Bridge 10x5 seconds for 2 sets  Crunches              Forward 10x2  R 10x2             L 10x2    hooklying hip adduction ball and glute max squeeze 10x5 seconds for 2 sets  hooklying lower abdominal raise 10x2  Seated B scapular retraction 10x5 seconds   Seated trunk flexion 3x5 seconds. Feels better for back.   Seated L hip extension isometrics 10x5 seconds     Improved exercise technique, movement at target joints, use of target muscles after mod verbal, visual, tactile cues.  Response to treatment No change in back pain level after session.   Clinical impression Worked on decreasing pressure to low back as well as improving core strength to promote better mechanics at her low back. No change in back pain level after session. Pt will benefit from continued skilled physical therapy services to decrease pain overall, improve strength and function.       PT Short Term Goals - 01/28/21 1852       PT SHORT TERM GOAL #1   Title Pt will be independent with her initial HEP to decrease pain, improve strength, and ability to perform work duties with less difficulty.    Time 3    Period Weeks    Status New    Target Date 02/18/21             PT Long Term Goals - 01/28/21 1853      PT LONG TERM GOAL #1   Title Pt will have a decrease in low back pain to 3/10 or less at worst to promote ability to perform work duties, wear her uniform, perform tasks which involve bending over as well as tolerate prolonged standing with less difficutly.    Baseline 9/10 low back pain at most for the past 3 months (01/28/2021)    Time 8    Period Weeks    Status New    Target Date 03/25/21      PT LONG TERM GOAL #2   Title Patient will improve bilateral hip extension and abduction strength by at least 1/2 MMT to promote ability to perform standing tasks with less back pain.    Baseline Hip extension 4/5 R, 4-/5 L, hip abduction 4/5 R and L (01/28/2021)    Time 8    Period Weeks    Status New    Target Date 03/25/21      PT LONG TERM GOAL #3   Title Patient will improve B hip IR ROM by at least 10 degrees to promote ability to perform standing tasks with less back pain.    Baseline Hip IR: 17 degrees R, 16 degrees L (01/28/2021)    Time 8    Period Weeks    Status New    Target Date 03/25/21      PT LONG TERM GOAL #4   Title Patient will improve her Lumbar FOTO score by at least 10 points as a demonstration of improved function.    Baseline FOTO emailed to pt secondary to ipad internet difficulties. (01/28/2021)    Time 8    Period Weeks    Status New    Target Date 03/25/21                 Plan - 02/15/21 1819    Clinical Impression Statement Worked on decreasing pressure to low back as well as improving core strength to promote better mechanics at her low back. No change in back pain level after session. Pt will benefit from continued skilled  physical therapy services to  decrease pain overall, improve strength and function.    Personal Factors and Comorbidities Past/Current Experience;Profession;Time since onset of injury/illness/exacerbation    Examination-Activity Limitations Bed Mobility;Bend;Carry;Lift;Locomotion Level;Stand;Transfers    Stability/Clinical Decision Making Stable/Uncomplicated   pain is worsening per pt.   Clinical Decision Making Low    Rehab Potential Fair    PT Frequency 2x / week    PT Duration 8 weeks    PT Treatment/Interventions Therapeutic exercise;Therapeutic activities;Neuromuscular re-education;Patient/family education;Manual techniques;Dry needling;Spinal Manipulations;Joint Manipulations;Aquatic Therapy;Biofeedback;Electrical Stimulation;Iontophoresis 4mg /ml Dexamethasone;Traction    PT Next Visit Plan posture, core, hip strength, femoral control, lumbopelvic control, manual techniques, modalities PRN    Consulted and Agree with Plan of Care Patient           Patient will benefit from skilled therapeutic intervention in order to improve the following deficits and impairments:  Pain,Postural dysfunction,Improper body mechanics,Decreased strength  Visit Diagnosis: Chronic bilateral low back pain, unspecified whether sciatica present  Muscle weakness (generalized)     Problem List Patient Active Problem List   Diagnosis Date Noted  . ADD (attention deficit disorder) 01/29/2021  . Positive ANA (antinuclear antibody) 05/08/2020  . Chronic neck pain 05/08/2020  . Bilateral hand pain 04/28/2020  . Arthralgia 04/28/2020  . Vulvar cyst 05/13/2019  . Metatarsalgia of right foot 08/07/2018  . Depression, major, single episode, moderate (HCC) 04/20/2017  . GAD (generalized anxiety disorder) 04/20/2017  . Cervical disc disease 04/20/2017  . Abdominal pain, epigastric - episodic, ? gallstones 10/28/2015  . CIN III (cervical intraepithelial neoplasia III) 07/31/2014  . HSV-2 (herpes simplex virus 2) infection 05/14/2014     05/16/2014 PT, DPT   02/15/2021, 6:58 PM  [facility] SPORTS [facility]IC[facility] [address], [place] Phone: [phone]   Fax:  [phone]  Name: Carly Suarez MRN: [name] Date of Birth: [birth year]

## 2021-02-17 ENCOUNTER — Other Ambulatory Visit: Payer: Self-pay

## 2021-02-17 ENCOUNTER — Ambulatory Visit: Payer: PRIVATE HEALTH INSURANCE

## 2021-02-17 DIAGNOSIS — G8929 Other chronic pain: Secondary | ICD-10-CM

## 2021-02-17 DIAGNOSIS — M545 Low back pain, unspecified: Secondary | ICD-10-CM

## 2021-02-17 DIAGNOSIS — M6281 Muscle weakness (generalized): Secondary | ICD-10-CM

## 2021-02-17 NOTE — Therapy (Signed)
[facility] PH[facility]ORTS MEDICINE [address] [place], [address] Phone: [phone]   Fax:  [phone]  Physical Therapy Treatment  Patient Details  Name: Carly Suarez MRN: [id number] Date of Birth: 05/21/1986 Referring Provider (PT): Lonell Face, MD   Encounter Date: 02/17/2021   PT End of Session - 02/17/21 1733    Visit Number 6    Number of Visits 17    Date for PT Re-Evaluation 03/25/21    PT Start Time 1733    PT Stop Time 1816    PT Time Calculation (min) 43 min    Activity Tolerance Patient tolerated treatment well    Behavior During Therapy [facility] for tasks assessed/performed           Past Medical History:  Diagnosis Date  . ADD (attention deficit disorder)   . History of chicken pox   . History of shingles     Past Surgical History:  Procedure Laterality Date  . LEEP N/A 06/30/2014   Procedure: LOOP ELECTROSURGICAL EXCISION PROCEDURE (LEEP);  Surgeon: Annamaria Boots, MD;  Location: [facility];  Service: Gynecology;  Laterality: N/A;  . WISDOM TOOTH EXTRACTION  2005    There were no vitals filed for this visit.   Subjective Assessment - 02/17/21 1735    Subjective Back is feeling better. 3/10 currently; 7/10 at most for the past 7 days.    Pertinent History Low back pain. Pain began years ago as long as she can remember. Pt got fed up with her back pain. Pt wears a uniform for work bothered her back and the belt she has to wear does not help.  Difficulty holding her bladder at times. Doctor does not know about it. Pt started having to wear liners recently (pt was recommended to tell her doctor about it. Pt verbalized understanding). Difficulty with bladder started within the last year. Back is progressively worsening. No known saddle anesthesia. No loss of strength from the waist down.    Patient Stated Goals Decrease pain. Be able stand for an extended period of time.    Currently in Pain? Yes    Pain  Score 3     Pain Onset More than a month ago                                     PT Education - 02/17/21 1741    Education Details ther-ex    Person(s) Educated Patient    Methods Explanation;Demonstration;Tactile cues;Verbal cues    Comprehension Verbalized understanding;Returned demonstration          Objective  [email or web address]  No latex band allergy Palpation: TTP R L5 transverse process in standing TTP L sacral Ala in prone Anterior L ASIS   Decreased B femoral control with stand to sit.  Difficulty with bladder started within the last year. Back is progressively worsening.   MedbdridgeAccess Code [email or web address]  Pt states doing gymnastics and dance when she was a kid   Manual therapy  hooklying manual lumbar traction      Therapeutic exercise Palpation: B lumbar paraspinal muscle tension  Slight L lumbar rotation  Seated manually resisted L upper trunk rotation isometrics in neutral to promote gentle R lumbar rotation to promote more neutral posture 10x3 with 5 seconds   Seated manually resisted trunk flexion isometrics 10x5 seconds for 3 sets    Seated hip extension  isometrics   L 10x5 seconds.   R 10x5 seconds, for 2 sets. No low back pain afterwards, just tired.    Crunches  Forward 10x2 R 10x2 L 10x2   Bridge 10x5 seconds for 2 sets    Improved exercise technique, movement at target joints, use of target muscles after mod verbal, visual, tactile cues.  Response to treatment Pt states back feels better after session per pt    Clinical impression Worked on decreasing compression pressure to low back through manual lumbar traction. Also worked on promoting more neutral lumbar posture, as well as trunk and glute strength to decrease stress to low back. Improved low back level of comfort reported after session. Pt will benefit from continued skilled physical therapy  services to decrease pain, improve strength and function.     PT Short Term Goals - 01/28/21 1852      PT SHORT TERM GOAL #1   Title Pt will be independent with her initial HEP to decrease pain, improve strength, and ability to perform work duties with less difficulty.    Time 3    Period Weeks    Status New    Target Date 02/18/21             PT Long Term Goals - 01/28/21 1853      PT LONG TERM GOAL #1   Title Pt will have a decrease in low back pain to 3/10 or less at worst to promote ability to perform work duties, wear her uniform, perform tasks which involve bending over as well as tolerate prolonged standing with less difficutly.    Baseline 9/10 low back pain at most for the past 3 months (01/28/2021)    Time 8    Period Weeks    Status New    Target Date 03/25/21      PT LONG TERM GOAL #2   Title Patient will improve bilateral hip extension and abduction strength by at least 1/2 MMT to promote ability to perform standing tasks with less back pain.    Baseline Hip extension 4/5 R, 4-/5 L, hip abduction 4/5 R and L (01/28/2021)    Time 8    Period Weeks    Status New    Target Date 03/25/21      PT LONG TERM GOAL #3   Title Patient will improve B hip IR ROM by at least 10 degrees to promote ability to perform standing tasks with less back pain.    Baseline Hip IR: 17 degrees R, 16 degrees L (01/28/2021)    Time 8    Period Weeks    Status New    Target Date 03/25/21      PT LONG TERM GOAL #4   Title Patient will improve her Lumbar FOTO score by at least 10 points as a demonstration of improved function.    Baseline FOTO emailed to pt secondary to ipad internet difficulties. (01/28/2021)    Time 8    Period Weeks    Status New    Target Date 03/25/21                 Plan - 02/17/21 1743    Clinical Impression Statement Worked on decreasing compression pressure to low back through manual lumbar traction. Also worked on promoting more neutral lumbar  posture, as well as trunk and glute strength to decrease stress to low back. Improved low back level of comfort reported after session. Pt will benefit from continued skilled  physical therapy services to decrease pain, improve strength and function.    Personal Factors and Comorbidities Past/Current Experience;Profession;Time since onset of injury/illness/exacerbation    Examination-Activity Limitations Bed Mobility;Bend;Carry;Lift;Locomotion Level;Stand;Transfers    Stability/Clinical Decision Making Stable/Uncomplicated   pain is worsening per pt.   Clinical Decision Making Low    Rehab Potential Fair    PT Frequency 2x / week    PT Duration 8 weeks    PT Treatment/Interventions Therapeutic exercise;Therapeutic activities;Neuromuscular re-education;Patient/family education;Manual techniques;Dry needling;Spinal Manipulations;Joint Manipulations;Aquatic Therapy;Biofeedback;Electrical Stimulation;Iontophoresis 4mg /ml Dexamethasone;Traction    PT Next Visit Plan posture, core, hip strength, femoral control, lumbopelvic control, manual techniques, modalities PRN    Consulted and Agree with Plan of Care Patient           Patient will benefit from skilled therapeutic intervention in order to improve the following deficits and impairments:  Pain,Postural dysfunction,Improper body mechanics,Decreased strength  Visit Diagnosis: Chronic bilateral low back pain, unspecified whether sciatica present  Muscle weakness (generalized)     Problem List Patient Active Problem List   Diagnosis Date Noted  . ADD (attention deficit disorder) 01/29/2021  . Positive ANA (antinuclear antibody) 05/08/2020  . Chronic neck pain 05/08/2020  . Bilateral hand pain 04/28/2020  . Arthralgia 04/28/2020  . Vulvar cyst 05/13/2019  . Metatarsalgia of right foot 08/07/2018  . Depression, major, single episode, moderate (HCC) 04/20/2017  . GAD (generalized anxiety disorder) 04/20/2017  . Cervical disc disease  04/20/2017  . Abdominal pain, epigastric - episodic, ? gallstones 10/28/2015  . CIN III (cervical intraepithelial neoplasia III) 07/31/2014  . HSV-2 (herpes simplex virus 2) infection 05/14/2014   05/16/2014 PT, DPT   02/17/2021, 6:25 PM  [facility] PHYSICAL AND SPORTS MEDICINE [address], [place] Phone: [phone]   Fax:  [phone]  Name: Carly Suarez MRN: [name] Date of Birth: 1986/06/20

## 2021-02-17 NOTE — Patient Instructions (Signed)
Seated hip extension isometrics   Sitting on a chair,    Squeeze your rear end muscles together and press your right foot onto the floor.     Hold for 5 seconds    Repeat 10 times   Perform 2 sets daily.      This is a corrective exercise. Once you no longer have symptoms, you can stop.

## 2021-02-21 ENCOUNTER — Other Ambulatory Visit: Payer: Self-pay | Admitting: Family Medicine

## 2021-02-22 NOTE — Telephone Encounter (Signed)
Carly Suarez has appointment on Tues 02/23/21 to follow up on mood.  Please refill at that time if appropriate.

## 2021-02-23 ENCOUNTER — Ambulatory Visit: Payer: PRIVATE HEALTH INSURANCE | Admitting: Family Medicine

## 2021-02-23 ENCOUNTER — Encounter: Payer: Self-pay | Admitting: Family Medicine

## 2021-02-23 ENCOUNTER — Other Ambulatory Visit: Payer: Self-pay

## 2021-02-23 VITALS — BP 160/96 | HR 68 | Temp 98.3°F | Ht 65.0 in | Wt 144.5 lb

## 2021-02-23 DIAGNOSIS — R030 Elevated blood-pressure reading, without diagnosis of hypertension: Secondary | ICD-10-CM | POA: Insufficient documentation

## 2021-02-23 DIAGNOSIS — M542 Cervicalgia: Secondary | ICD-10-CM | POA: Diagnosis not present

## 2021-02-23 DIAGNOSIS — F321 Major depressive disorder, single episode, moderate: Secondary | ICD-10-CM | POA: Diagnosis not present

## 2021-02-23 DIAGNOSIS — F411 Generalized anxiety disorder: Secondary | ICD-10-CM

## 2021-02-23 DIAGNOSIS — G8929 Other chronic pain: Secondary | ICD-10-CM

## 2021-02-23 MED ORDER — METOPROLOL TARTRATE 25 MG PO TABS: 25.0000 mg | ORAL_TABLET | Freq: Every day | ORAL | 0 refills | Status: DC | PRN

## 2021-02-23 MED ORDER — DULOXETINE HCL 30 MG PO CPEP: 90.0000 mg | ORAL_CAPSULE | Freq: Every day | ORAL | 3 refills | Status: DC

## 2021-02-23 NOTE — Patient Instructions (Addendum)
Increase Cymbalta 90  mg daily If BP greater than 160/100.. take a metoprolol as needed

## 2021-02-23 NOTE — Progress Notes (Signed)
Patient ID: Carly Suarez, female    DOB: 1986-03-16, 35 y.o.   MRN: [id number]  This visit was conducted in person.  BP (!) 160/96   Pulse 68   Temp 98.3 F (36.8 C) (Temporal)   Ht 5\' 5"  (1.651 m)   Wt 144 lb 8 oz (65.5 kg)   LMP 01/26/2021 (Exact Date)   SpO2 98%   BMI 24.05 kg/m    CC:  Chief Complaint  Patient presents with  . Follow-up    Mood & Focus    Subjective:   HPI: Carly Suarez is a 35 y.o. female presenting on 02/23/2021 for Follow-up (Mood & Focus)   MDD At last OV stopped sertraline and changed to Cymbalta 30 then titrated up to 60 mg daily. Minimal improvement in mood.    ADD focus and concentrations.   Was on ritalin as a child.   PHQ9 SCORE ONLY 01/29/2021 11/27/2020 10/30/2020  PHQ-9 Total Score 9 13 9      She has noted increase in BP randomly. At rest, randomly. Not always associated with anxiety or stressful situations.  No palpitations. No chest pain.  BP 132-160/80-110  Mother with HTN.  No  caffeine BP Readings from Last 3 Encounters:  02/23/21 (!) 160/96  01/29/21 (!) 144/96  11/27/20 130/90   Nml CBC, TSH and CMET in last 4 months  Neck pain better s/p trigger point injections.      Relevant past medical, surgical, family and social history reviewed and updated as indicated. Interim medical history since our last visit reviewed. Allergies and medications reviewed and updated. Outpatient Medications Prior to Visit  Medication Sig Dispense Refill  . baclofen (LIORESAL) 10 MG tablet Take by mouth.    . DULoxetine (CYMBALTA) 60 MG capsule Take 1 capsule (60 mg total) by mouth daily. 30 capsule 5  . Norgestimate-Ethinyl Estradiol Triphasic (TRI-SPRINTEC) 0.18/0.215/0.25 MG-35 MCG tablet Take 1 tablet by mouth daily. 3 Package 4  . valACYclovir (VALTREX) 1000 MG tablet Take 1,000 mg by mouth daily as needed.    . fluticasone (FLONASE) 50 MCG/ACT nasal spray Place 2 sprays into both nostrils daily. 16 g 5  . valACYclovir (VALTREX) 1000  MG tablet Take 1 tablet (1,000 mg total) by mouth daily. (Patient taking differently: Take 1,000 mg by mouth as needed.) 90 tablet 4   No facility-administered medications prior to visit.     Per HPI unless specifically indicated in ROS section below Review of Systems  Constitutional: Negative for fatigue and fever.  HENT: Negative for congestion.   Eyes: Negative for pain.  Respiratory: Negative for cough and shortness of breath.   Cardiovascular: Negative for chest pain, palpitations and leg swelling.  Gastrointestinal: Negative for abdominal pain.  Genitourinary: Negative for dysuria and vaginal bleeding.  Musculoskeletal: Negative for back pain.  Neurological: Negative for syncope, light-headedness and headaches.  Psychiatric/Behavioral: Positive for dysphoric mood and sleep disturbance. The patient is nervous/anxious.    Objective:  BP (!) 160/96   Pulse 68   Temp 98.3 F (36.8 C) (Temporal)   Ht 5\' 5"  (1.651 m)   Wt 144 lb 8 oz (65.5 kg)   LMP 01/26/2021 (Exact Date)   SpO2 98%   BMI 24.05 kg/m   Wt Readings from Last 3 Encounters:  02/23/21 144 lb 8 oz (65.5 kg)  01/29/21 140 lb (63.5 kg)  11/27/20 140 lb (63.5 kg)      Physical Exam Constitutional:      General: She is  not in acute distress.    Appearance: Normal appearance. She is well-developed. She is not ill-appearing or toxic-appearing.  HENT:     Head: Normocephalic.     Right Ear: Hearing, tympanic membrane, ear canal and external ear normal. Tympanic membrane is not erythematous, retracted or bulging.     Left Ear: Hearing, tympanic membrane, ear canal and external ear normal. Tympanic membrane is not erythematous, retracted or bulging.     Nose: No mucosal edema or rhinorrhea.     Right Sinus: No maxillary sinus tenderness or frontal sinus tenderness.     Left Sinus: No maxillary sinus tenderness or frontal sinus tenderness.     Mouth/Throat:     Pharynx: Uvula midline.  Eyes:     General: Lids are  normal. Lids are everted, no foreign bodies appreciated.     Conjunctiva/sclera: Conjunctivae normal.     Pupils: Pupils are equal, round, and reactive to light.  Neck:     Thyroid: No thyroid mass or thyromegaly.     Vascular: No carotid bruit.     Trachea: Trachea normal.  Cardiovascular:     Rate and Rhythm: Normal rate and regular rhythm.     Pulses: Normal pulses.     Heart sounds: Normal heart sounds, S1 normal and S2 normal. No murmur heard. No friction rub. No gallop.   Pulmonary:     Effort: Pulmonary effort is normal. No tachypnea or respiratory distress.     Breath sounds: Normal breath sounds. No decreased breath sounds, wheezing, rhonchi or rales.  Abdominal:     General: Bowel sounds are normal.     Palpations: Abdomen is soft.     Tenderness: There is no abdominal tenderness.  Musculoskeletal:     Cervical back: Normal range of motion and neck supple.  Skin:    General: Skin is warm and dry.     Findings: No rash.  Neurological:     Mental Status: She is alert.  Psychiatric:        Mood and Affect: Mood is not anxious or depressed.        Speech: Speech normal.        Behavior: Behavior normal. Behavior is cooperative.        Thought Content: Thought content normal.        Judgment: Judgment normal.       Results for orders placed or performed in visit on 10/23/20  Comprehensive metabolic panel  Result Value Ref Range   Sodium 138 135 - 145 mEq/L   Potassium 4.0 3.5 - 5.1 mEq/L   Chloride 103 96 - 112 mEq/L   CO2 27 19 - 32 mEq/L   Glucose, Bld 86 70 - 99 mg/dL   BUN 13 6 - 23 mg/dL   Creatinine, Ser 0.30 0.40 - 1.20 mg/dL   Total Bilirubin 0.5 0.2 - 1.2 mg/dL   Alkaline Phosphatase 41 39 - 117 U/L   AST 15 0 - 37 U/L   ALT 13 0 - 35 U/L   Total Protein 7.0 6.0 - 8.3 g/dL   Albumin 4.1 3.5 - 5.2 g/dL   GFR 092.33 >00.76 mL/min   Calcium 9.7 8.4 - 10.5 mg/dL  Lipid panel  Result Value Ref Range   Cholesterol 186 0 - 200 mg/dL   Triglycerides 226.3  0.0 - 149.0 mg/dL   HDL 33.54 >56.25 mg/dL   VLDL 63.8 0.0 - 93.7 mg/dL   LDL Cholesterol 82 0 - 99 mg/dL   Total  CHOL/HDL Ratio 2    NonHDL 110.25   Hepatitis C antibody  Result Value Ref Range   Hepatitis C Ab NON-REACTIVE NON-REACTI   SIGNAL TO CUT-OFF 0.00 <1.00    This visit occurred during the SARS-CoV-2 public health emergency.  Safety protocols were in place, including screening questions prior to the visit, additional usage of staff PPE, and extensive cleaning of exam room while observing appropriate contact time as indicated for disinfecting solutions.   COVID 19 screen:  No recent travel or known exposure to COVID19 The patient denies respiratory symptoms of COVID 19 at this time. The importance of social distancing was discussed today.   Assessment and Plan    Problem List Items Addressed This Visit    Chronic neck pain    Improving with trigger point injections.      Relevant Medications   baclofen (LIORESAL) 10 MG tablet   DULoxetine (CYMBALTA) 30 MG capsule   Depression, major, single episode, moderate (HCC) - Primary    Increase cymbalta to 90 mg daily... follow up in 4 weeks.      Relevant Medications   DULoxetine (CYMBALTA) 30 MG capsule   Elevated blood pressure reading without diagnosis of hypertension    Persistently elevated. Not clearly due to anxiety as sometime up when feeling well. Negative labs eval for secondary causes.  Pt does not want to start regular BP med.. wants to see if improves with improvement in mood and pain.  Will provide  Short acting BBlocker for her to use prn BP > 160/100      GAD (generalized anxiety disorder)    Poor control... increase cymbalta dose.      Relevant Medications   DULoxetine (CYMBALTA) 30 MG capsule       Kerby Nora, MD

## 2021-02-24 NOTE — Assessment & Plan Note (Signed)
Persistently elevated. Not clearly due to anxiety as sometime up when feeling well. Negative labs eval for secondary causes.  Pt does not want to start regular BP med.. wants to see if improves with improvement in mood and pain.  Will provide  Short acting BBlocker for her to use prn BP > 160/100

## 2021-02-24 NOTE — Assessment & Plan Note (Signed)
Improving with trigger point injections.

## 2021-02-24 NOTE — Assessment & Plan Note (Signed)
Increase cymbalta to 90 mg daily... follow up in 4 weeks.

## 2021-02-24 NOTE — Assessment & Plan Note (Signed)
Poor control... increase cymbalta dose.

## 2021-02-25 ENCOUNTER — Other Ambulatory Visit: Payer: Self-pay

## 2021-02-25 ENCOUNTER — Ambulatory Visit: Payer: PRIVATE HEALTH INSURANCE | Attending: Neurology

## 2021-02-25 DIAGNOSIS — M545 Low back pain, unspecified: Secondary | ICD-10-CM | POA: Insufficient documentation

## 2021-02-25 DIAGNOSIS — M6281 Muscle weakness (generalized): Secondary | ICD-10-CM | POA: Diagnosis present

## 2021-02-25 DIAGNOSIS — G8929 Other chronic pain: Secondary | ICD-10-CM | POA: Insufficient documentation

## 2021-02-25 NOTE — Therapy (Signed)
[name] PH[facility]OR[facility]INE [address], [place], [address] Phone: [phone]   Fax:  [phone]  Physical Therapy Treatment  Patient Details  Name: Carly Suarez MRN: [id number] Date of Birth: 06/18/1986 Referring Provider (PT): Lonell Face, MD   Encounter Date: 02/25/2021   PT End of Session - 02/25/21 1727    Visit Number 7    Number of Visits 17    Date for PT Re-Evaluation 03/25/21    PT Start Time 1728   pt arrived late   PT Stop Time 1808    PT Time Calculation (min) 40 min    Activity Tolerance Patient tolerated treatment well    Behavior During Therapy [facility] for tasks assessed/performed           Past Medical History:  Diagnosis Date  . ADD (attention deficit disorder)   . History of chicken pox   . History of shingles     Past Surgical History:  Procedure Laterality Date  . LEEP N/A 06/30/2014   Procedure: LOOP ELECTROSURGICAL EXCISION PROCEDURE (LEEP);  Surgeon: Annamaria Boots, MD;  Location: [facility];  Service: Gynecology;  Laterality: N/A;  . WISDOM TOOTH EXTRACTION  2005    There were no vitals filed for this visit.   Subjective Assessment - 02/25/21 1729    Subjective Back is ok, its pretty low today, about a 3/10 currently, 5/10 at most for the past 7 days. Got injections in her neck last Friday which helped.    Pertinent History Low back pain. Pain began years ago as long as she can remember. Pt got fed up with her back pain. Pt wears a uniform for work bothered her back and the belt she has to wear does not help.  Difficulty holding her bladder at times. Doctor does not know about it. Pt started having to wear liners recently (pt was recommended to tell her doctor about it. Pt verbalized understanding). Difficulty with bladder started within the last year. Back is progressively worsening. No known saddle anesthesia. No loss of strength from the waist down.    Patient Stated Goals Decrease  pain. Be able stand for an extended period of time.    Currently in Pain? Yes    Pain Score 3     Pain Onset More than a month ago                                     PT Education - 02/25/21 1745    Education Details ther-ex    Person(s) Educated Patient    Methods Explanation;Demonstration;Tactile cues;Verbal cues    Comprehension Returned demonstration;Verbalized understanding              Objective  [email or web address]  No latex band allergy Palpation: TTP R L5 transverse process in standing TTP L sacral Ala in prone Anterior L ASIS   Decreased B femoral control with stand to sit.  Difficulty with bladder started within the last year. Back is progressively worsening.   MedbdridgeAccess Code [email or web address]  Pt states doing gymnastics and dance when she was a kid  Manual therapy  hooklying manual lumbar traction    Therapeutic exercise Palpation: B lumbar paraspinal muscle tension  Slight L lumbar rotation  Crunches  Forward 10x2 R 10x2 L 10x2  Bridge 10x5 seconds for 2 sets  Seated manually resisted L upper trunk rotation isometrics in  neutral to promote gentle R lumbar rotation to promote more neutral posture 10x3 with 5 seconds   Seated hip extension isometrics                           R 10x5 seconds, for 2 sets. No low back pain afterwards, just tired.    Seated manually resisted trunk flexion isometrics 10x5 seconds for 2 sets    Sated manually resisted trunk extension isometrics 5x5 seconds. Increased lumbar soreness  Seated trunk flexion. Feels better.      Improved exercise technique, movement at target joints, use of target muscles after mod verbal, visual, tactile cues.  Response to treatment Fair tolerance to session.    Clinical impression Decreasing overall low back pain from 9/10 to 5/10 at worst based on subjective reports. Continued working on  decreasing compression pressure to her low back as well as trunk and glute strength to decrease stress to that area as well. Pt will benefit from continued skilled physical therapy services to decrease pain, improve strength and function.         PT Short Term Goals - 01/28/21 1852      PT SHORT TERM GOAL #1   Title Pt will be independent with her initial HEP to decrease pain, improve strength, and ability to perform work duties with less difficulty.    Time 3    Period Weeks    Status New    Target Date 02/18/21             PT Long Term Goals - 01/28/21 1853      PT LONG TERM GOAL #1   Title Pt will have a decrease in low back pain to 3/10 or less at worst to promote ability to perform work duties, wear her uniform, perform tasks which involve bending over as well as tolerate prolonged standing with less difficutly.    Baseline 9/10 low back pain at most for the past 3 months (01/28/2021)    Time 8    Period Weeks    Status New    Target Date 03/25/21      PT LONG TERM GOAL #2   Title Patient will improve bilateral hip extension and abduction strength by at least 1/2 MMT to promote ability to perform standing tasks with less back pain.    Baseline Hip extension 4/5 R, 4-/5 L, hip abduction 4/5 R and L (01/28/2021)    Time 8    Period Weeks    Status New    Target Date 03/25/21      PT LONG TERM GOAL #3   Title Patient will improve B hip IR ROM by at least 10 degrees to promote ability to perform standing tasks with less back pain.    Baseline Hip IR: 17 degrees R, 16 degrees L (01/28/2021)    Time 8    Period Weeks    Status New    Target Date 03/25/21      PT LONG TERM GOAL #4   Title Patient will improve her Lumbar FOTO score by at least 10 points as a demonstration of improved function.    Baseline FOTO emailed to pt secondary to ipad internet difficulties. (01/28/2021)    Time 8    Period Weeks    Status New    Target Date 03/25/21                 Plan  - 02/25/21 1745  Clinical Impression Statement Decreasing overall low back pain from 9/10 to 5/10 at worst based on subjective reports. Continued working on decreasing compression pressure to her low back as well as trunk and glute strength to decrease stress to that area as well. Pt will benefit from continued skilled physical therapy services to decrease pain, improve strength and function.    Personal Factors and Comorbidities Past/Current Experience;Profession;Time since onset of injury/illness/exacerbation    Examination-Activity Limitations Bed Mobility;Bend;Carry;Lift;Locomotion Level;Stand;Transfers    Stability/Clinical Decision Making Stable/Uncomplicated   pain is worsening per pt.   Clinical Decision Making Low    Rehab Potential Fair    PT Frequency 2x / week    PT Duration 8 weeks    PT Treatment/Interventions Therapeutic exercise;Therapeutic activities;Neuromuscular re-education;Patient/family education;Manual techniques;Dry needling;Spinal Manipulations;Joint Manipulations;Aquatic Therapy;Biofeedback;Electrical Stimulation;Iontophoresis 4mg /ml Dexamethasone;Traction    PT Next Visit Plan posture, core, hip strength, femoral control, lumbopelvic control, manual techniques, modalities PRN    Consulted and Agree with Plan of Care Patient           Patient will benefit from skilled therapeutic intervention in order to improve the following deficits and impairments:  Pain,Postural dysfunction,Improper body mechanics,Decreased strength  Visit Diagnosis: Chronic bilateral low back pain, unspecified whether sciatica present  Muscle weakness (generalized)     Problem List Patient Active Problem List   Diagnosis Date Noted  . Elevated blood pressure reading without diagnosis of hypertension 02/23/2021  . ADD (attention deficit disorder) 01/29/2021  . Positive ANA (antinuclear antibody) 05/08/2020  . Chronic neck pain 05/08/2020  . Bilateral hand pain 04/28/2020  .  Arthralgia 04/28/2020  . Vulvar cyst 05/13/2019  . Metatarsalgia of right foot 08/07/2018  . Depression, major, single episode, moderate (HCC) 04/20/2017  . GAD (generalized anxiety disorder) 04/20/2017  . Cervical disc disease 04/20/2017  . Abdominal pain, epigastric - episodic, ? gallstones 10/28/2015  . CIN III (cervical intraepithelial neoplasia III) 07/31/2014  . 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, [place] Phone: [phone]   Fax:  [phone]  Name: ALBERTINA LEISE MRN: [name] Date of Birth: 20-Jul-1986

## 2021-03-02 ENCOUNTER — Ambulatory Visit: Payer: PRIVATE HEALTH INSURANCE

## 2021-03-02 ENCOUNTER — Other Ambulatory Visit: Payer: Self-pay

## 2021-03-02 DIAGNOSIS — G8929 Other chronic pain: Secondary | ICD-10-CM

## 2021-03-02 DIAGNOSIS — M545 Low back pain, unspecified: Secondary | ICD-10-CM

## 2021-03-02 DIAGNOSIS — M6281 Muscle weakness (generalized): Secondary | ICD-10-CM

## 2021-03-02 NOTE — Therapy (Signed)
[place] [facility] PHYS[facility]ORTS MEDICINE [address], [place], [address] Phone: [phone]   Fax:  [phone]  Physical Therapy Treatment  Patient Details  Name: Carly Suarez MRN: [id number] Date of Birth: September 23, 1986 Referring Provider (PT): Lonell Face, MD   Encounter Date: 03/02/2021   PT End of Session - 03/02/21 1718    Visit Number 8    Number of Visits 17    Date for PT Re-Evaluation 03/25/21    PT Start Time 1719    PT Stop Time 1803    PT Time Calculation (min) 44 min    Activity Tolerance Patient tolerated treatment well    Behavior During Therapy [facility] for tasks assessed/performed           Past Medical History:  Diagnosis Date  . ADD (attention deficit disorder)   . History of chicken pox   . History of shingles     Past Surgical History:  Procedure Laterality Date  . LEEP N/A 06/30/2014   Procedure: LOOP ELECTROSURGICAL EXCISION PROCEDURE (LEEP);  Surgeon: Annamaria Boots, MD;  Location: [facility];  Service: Gynecology;  Laterality: N/A;  . WISDOM TOOTH EXTRACTION  2005    There were no vitals filed for this visit.   Subjective Assessment - 03/02/21 1720    Subjective Back is sore. Thinks its more muscle. Feels like she has knots. 4/10 currently.    Pertinent History Low back pain. Pain began years ago as long as she can remember. Pt got fed up with her back pain. Pt wears a uniform for work bothered her back and the belt she has to wear does not help.  Difficulty holding her bladder at times. Doctor does not know about it. Pt started having to wear liners recently (pt was recommended to tell her doctor about it. Pt verbalized understanding). Difficulty with bladder started within the last year. Back is progressively worsening. No known saddle anesthesia. No loss of strength from the waist down.    Patient Stated Goals Decrease pain. Be able stand for an extended period of time.    Currently in Pain? Yes     Pain Score 4     Pain Onset More than a month ago                                     PT Education - 03/02/21 1805    Education Details ther-ex    Person(s) Educated Patient    Methods Explanation;Demonstration;Tactile cues;Verbal cues    Comprehension Returned demonstration;Verbalized understanding          Objective  [email or web address]  No latex band allergy Palpation: TTP R L5 transverse process in standing TTP L sacral Ala in prone Anterior L ASIS   Decreased B femoral control with stand to sit.  Difficulty with bladder started within the last year. Back is progressively worsening.   MedbdridgeAccess Code [email or web address]  Pt states doing gymnastics and dance when she was a kid  Manual therapy   Using massage chair STM B lumbar paraspinal muscles to decrease tension   Decreased pain afterwards  Then R UPA L4, L3 grade 3-   R UPA L2 TP grade 1 to 3- for pain control.   Decreased low back pain reported.    Therapeutic exercise Palpation: B lumbar paraspinal muscle tension  On massage chair   Transversus abdominis contraction 10x5 seconds  B shoulder extension gentle isometrics, hands on arm rest 10x5 seconds for 3 sets to promote rectus abdominis activation and decrease B thoracolumbar paraspinal muscle activation.    Then glute max set 10x5 seconds for 2 sets.   Improved exercise technique, movement at target joints, use of target muscles after min to mod verbal, visual, tactile cues.    Response to treatment Pt tolerated session well without aggravation of symptoms.  Decreased pain to 2/10 after session.    Clinical impression Decreased low back pain after treatment to decrease B lumbar paraspinal muscle tension as well as gentle joint mobilizations grades 1 to 3- for pain control. Worked on trunk and gentle glute max activation to help continue do decrease lumbar extensor muscle tension. Pt will benefit from  continued skilled physical therapy services to decrease pain, improve strength and function.        PT Short Term Goals - 01/28/21 1852      PT SHORT TERM GOAL #1   Title Pt will be independent with her initial HEP to decrease pain, improve strength, and ability to perform work duties with less difficulty.    Time 3    Period Weeks    Status New    Target Date 02/18/21             PT Long Term Goals - 01/28/21 1853      PT LONG TERM GOAL #1   Title Pt will have a decrease in low back pain to 3/10 or less at worst to promote ability to perform work duties, wear her uniform, perform tasks which involve bending over as well as tolerate prolonged standing with less difficutly.    Baseline 9/10 low back pain at most for the past 3 months (01/28/2021)    Time 8    Period Weeks    Status New    Target Date 03/25/21      PT LONG TERM GOAL #2   Title Patient will improve bilateral hip extension and abduction strength by at least 1/2 MMT to promote ability to perform standing tasks with less back pain.    Baseline Hip extension 4/5 R, 4-/5 L, hip abduction 4/5 R and L (01/28/2021)    Time 8    Period Weeks    Status New    Target Date 03/25/21      PT LONG TERM GOAL #3   Title Patient will improve B hip IR ROM by at least 10 degrees to promote ability to perform standing tasks with less back pain.    Baseline Hip IR: 17 degrees R, 16 degrees L (01/28/2021)    Time 8    Period Weeks    Status New    Target Date 03/25/21      PT LONG TERM GOAL #4   Title Patient will improve her Lumbar FOTO score by at least 10 points as a demonstration of improved function.    Baseline FOTO emailed to pt secondary to ipad internet difficulties. (01/28/2021)    Time 8    Period Weeks    Status New    Target Date 03/25/21                 Plan - 03/02/21 1803    Clinical Impression Statement Decreased low back pain after treatment to decrease B lumbar paraspinal muscle tension as well as  gentle joint mobilizations grades 1 to 3- for pain control. Worked on trunk and gentle glute max activation to help continue  do decrease lumbar extensor muscle tension. Pt will benefit from continued skilled physical therapy services to decrease pain, improve strength and function.    Personal Factors and Comorbidities Past/Current Experience;Profession;Time since onset of injury/illness/exacerbation    Examination-Activity Limitations Bed Mobility;Bend;Carry;Lift;Locomotion Level;Stand;Transfers    Stability/Clinical Decision Making Stable/Uncomplicated   pain is worsening per pt.   Clinical Decision Making Low    Rehab Potential Fair    PT Frequency 2x / week    PT Duration 8 weeks    PT Treatment/Interventions Therapeutic exercise;Therapeutic activities;Neuromuscular re-education;Patient/family education;Manual techniques;Dry needling;Spinal Manipulations;Joint Manipulations;Aquatic Therapy;Biofeedback;Electrical Stimulation;Iontophoresis 4mg /ml Dexamethasone;Traction    PT Next Visit Plan posture, core, hip strength, femoral control, lumbopelvic control, manual techniques, modalities PRN    Consulted and Agree with Plan of Care Patient           Patient will benefit from skilled therapeutic intervention in order to improve the following deficits and impairments:  Pain,Postural dysfunction,Improper body mechanics,Decreased strength  Visit Diagnosis: Chronic bilateral low back pain, unspecified whether sciatica present  Muscle weakness (generalized)     Problem List Patient Active Problem List   Diagnosis Date Noted  . Elevated blood pressure reading without diagnosis of hypertension 02/23/2021  . ADD (attention deficit disorder) 01/29/2021  . Positive ANA (antinuclear antibody) 05/08/2020  . Chronic neck pain 05/08/2020  . Bilateral hand pain 04/28/2020  . Arthralgia 04/28/2020  . Vulvar cyst 05/13/2019  . Metatarsalgia of right foot 08/07/2018  . Depression, major, single  episode, moderate (HCC) 04/20/2017  . GAD (generalized anxiety disorder) 04/20/2017  . Cervical disc disease 04/20/2017  . Abdominal pain, epigastric - episodic, ? gallstones 10/28/2015  . CIN III (cervical intraepithelial neoplasia III) 07/31/2014  . HSV-2 (herpes simplex virus 2) infection 05/14/2014    05/16/2014 PT, DPT   03/02/2021, 6:06 PM  [facility] PHYSICAL AND SPORTS MEDICINE [address], [address], [place] Phone: [phone]   Fax:  [phone]  Name: Carly Suarez MRN: [name] Date of Birth: 05/20/1986

## 2021-03-04 ENCOUNTER — Other Ambulatory Visit: Payer: Self-pay

## 2021-03-04 ENCOUNTER — Ambulatory Visit: Payer: PRIVATE HEALTH INSURANCE

## 2021-03-04 DIAGNOSIS — G8929 Other chronic pain: Secondary | ICD-10-CM

## 2021-03-04 DIAGNOSIS — M6281 Muscle weakness (generalized): Secondary | ICD-10-CM

## 2021-03-04 DIAGNOSIS — M545 Low back pain, unspecified: Secondary | ICD-10-CM | POA: Diagnosis not present

## 2021-03-04 NOTE — Therapy (Signed)
[facility] PHYSICAL AND SPORTS MEDICINE [address], [place], [address] Phone: [phone]   Fax:  [phone]  Physical Therapy Treatment  Patient Details  Name: Carly Suarez MRN: [id number] Date of Birth: 1986-11-17 Referring Provider (PT): Lonell Face, MD   Encounter Date: 03/04/2021   PT End of Session - 03/04/21 1719    Visit Number 9    Number of Visits 17    Date for PT Re-Evaluation 03/25/21    PT Start Time 1719    PT Stop Time 1807    PT Time Calculation (min) 48 min    Activity Tolerance Patient tolerated treatment well    Behavior During Therapy [facility] for tasks assessed/performed           Past Medical History:  Diagnosis Date  . ADD (attention deficit disorder)   . History of chicken pox   . History of shingles     Past Surgical History:  Procedure Laterality Date  . LEEP N/A 06/30/2014   Procedure: LOOP ELECTROSURGICAL EXCISION PROCEDURE (LEEP);  Surgeon: Annamaria Boots, MD;  Location: [facility];  Service: Gynecology;  Laterality: N/A;  . WISDOM TOOTH EXTRACTION  2005    There were no vitals filed for this visit.   Subjective Assessment - 03/04/21 1720    Subjective Still has a little tightness and a little twinge but it is not as bad. The manual therapy helped. 3.5/10 back pain currently.    Pertinent History Low back pain. Pain began years ago as long as she can remember. Pt got fed up with her back pain. Pt wears a uniform for work bothered her back and the belt she has to wear does not help.  Difficulty holding her bladder at times. Doctor does not know about it. Pt started having to wear liners recently (pt was recommended to tell her doctor about it. Pt verbalized understanding). Difficulty with bladder started within the last year. Back is progressively worsening. No known saddle anesthesia. No loss of strength from the waist down.    Patient Stated Goals Decrease pain. Be able stand for an extended  period of time.    Currently in Pain? Yes    Pain Score 4     Pain Onset More than a month ago                                     PT Education - 03/04/21 1755    Education Details ther-ex    Person(s) Educated Patient    Methods Explanation;Demonstration;Tactile cues;Verbal cues    Comprehension Returned demonstration;Verbalized understanding          Objective  [email or web address]  No latex band allergy Palpation: TTP R L5 transverse process in standing TTP L sacral Ala in prone Anterior L ASIS   Decreased B femoral control with stand to sit.  Difficulty with bladder started within the last year. Back is progressively worsening.   MedbdridgeAccess Code [email or web address]  Pt states doing gymnastics and dance when she was a kid  Manual therapy  Using massage chair STM B thoracolumbar paraspinal muscles to decrease tension              Decreased pain afterwards  Then R UPA L4, L3 grade 1 to  3-   R UPA L2 TP grade 3-   Not as TTP today compared to previous session  Therapeutic exercise Palpation: B lumbar paraspinal muscle tension L > R  On massage chair              Transversus abdominis contraction 10x5 seconds               B shoulder extension gentle isometrics, hands on arm rest 10x5 seconds for 3 sets to promote rectus abdominis activation and decrease B thoracolumbar paraspinal muscle activation.   Decreased L thoracolumbar paraspinal muscle tension palpated.   Seated B shoulder extension isometrics, hands on thighs 2x5 seconds   Increased symptoms.   Seated manually resisted R lateral shift isometrics 10x5 seconds   Seated manually resisted L upper trunk rotation 10x2 with 5 second holds  Decreased L thoracolumbar paraspinal muscle tension and pain reported  Seated hip extension isometrics   R 10x5 seconds   L 5x5 seconds   No difference in R low back pain    Improved exercise technique, movement at  target joints, use of target muscles after min to mod verbal, visual, tactile cues.    Response to treatment Pt tolerated session well without aggravation of symptoms.  Decreased L thoracolumbar paraspinal muscle tension and pain.   Clinical impression Pt demonstrates carry over of decreased pain from previous session. Continued manual therapy to decrease thoracolumbar paraspinal muscle tension and for pain control, blood flow and joint nutrition as well as worked core muscle activation to help decrease muscle tension. Pt will benefit from continued skilled physical therapy services to decrease pain, improve strength and function.          PT Short Term Goals - 01/28/21 1852      PT SHORT TERM GOAL #1   Title Pt will be independent with her initial HEP to decrease pain, improve strength, and ability to perform work duties with less difficulty.    Time 3    Period Weeks    Status New    Target Date 02/18/21             PT Long Term Goals - 01/28/21 1853      PT LONG TERM GOAL #1   Title Pt will have a decrease in low back pain to 3/10 or less at worst to promote ability to perform work duties, wear her uniform, perform tasks which involve bending over as well as tolerate prolonged standing with less difficutly.    Baseline 9/10 low back pain at most for the past 3 months (01/28/2021)    Time 8    Period Weeks    Status New    Target Date 03/25/21      PT LONG TERM GOAL #2   Title Patient will improve bilateral hip extension and abduction strength by at least 1/2 MMT to promote ability to perform standing tasks with less back pain.    Baseline Hip extension 4/5 R, 4-/5 L, hip abduction 4/5 R and L (01/28/2021)    Time 8    Period Weeks    Status New    Target Date 03/25/21      PT LONG TERM GOAL #3   Title Patient will improve B hip IR ROM by at least 10 degrees to promote ability to perform standing tasks with less back pain.    Baseline Hip IR: 17 degrees R, 16  degrees L (01/28/2021)    Time 8    Period Weeks    Status New    Target Date 03/25/21      PT LONG TERM GOAL #  4   Title Patient will improve her Lumbar FOTO score by at least 10 points as a demonstration of improved function.    Baseline FOTO emailed to pt secondary to ipad internet difficulties. (01/28/2021)    Time 8    Period Weeks    Status New    Target Date 03/25/21                 Plan - 03/04/21 1811    Clinical Impression Statement Pt demonstrates carry over of decreased pain from previous session. Continued manual therapy to decrease thoracolumbar paraspinal muscle tension and for pain control, blood flow and joint nutrition as well as worked core muscle activation to help decrease muscle tension. Pt will benefit from continued skilled physical therapy services to decrease pain, improve strength and function.    Personal Factors and Comorbidities Past/Current Experience;Profession;Time since onset of injury/illness/exacerbation    Examination-Activity Limitations Bed Mobility;Bend;Carry;Lift;Locomotion Level;Stand;Transfers    Stability/Clinical Decision Making Stable/Uncomplicated   pain is worsening per pt.   Clinical Decision Making Low    Rehab Potential Fair    PT Frequency 2x / week    PT Duration 8 weeks    PT Treatment/Interventions Therapeutic exercise;Therapeutic activities;Neuromuscular re-education;Patient/family education;Manual techniques;Dry needling;Spinal Manipulations;Joint Manipulations;Aquatic Therapy;Biofeedback;Electrical Stimulation;Iontophoresis 4mg /ml Dexamethasone;Traction    PT Next Visit Plan posture, core, hip strength, femoral control, lumbopelvic control, manual techniques, modalities PRN    Consulted and Agree with Plan of Care Patient           Patient will benefit from skilled therapeutic intervention in order to improve the following deficits and impairments:  Pain,Postural dysfunction,Improper body mechanics,Decreased  strength  Visit Diagnosis: Chronic bilateral low back pain, unspecified whether sciatica present  Muscle weakness (generalized)     Problem List Patient Active Problem List   Diagnosis Date Noted  . Elevated blood pressure reading without diagnosis of hypertension 02/23/2021  . ADD (attention deficit disorder) 01/29/2021  . Positive ANA (antinuclear antibody) 05/08/2020  . Chronic neck pain 05/08/2020  . Bilateral hand pain 04/28/2020  . Arthralgia 04/28/2020  . Vulvar cyst 05/13/2019  . Metatarsalgia of right foot 08/07/2018  . Depression, major, single episode, moderate (HCC) 04/20/2017  . GAD (generalized anxiety disorder) 04/20/2017  . Cervical disc disease 04/20/2017  . Abdominal pain, epigastric - episodic, ? gallstones 10/28/2015  . CIN III (cervical intraepithelial neoplasia III) 07/31/2014  . HSV-2 (herpes simplex virus 2) infection 05/14/2014    05/16/2014 PT, DPT   03/04/2021, 6:14 PM  [place] [facility] PHYSICAL AND SPORTS MEDICINE [address], [address], [place] Phone: [phone]   Fax:  [phone]  Name: Carly Suarez MRN: [name] Date of Birth: 06-04-1986

## 2021-03-09 ENCOUNTER — Ambulatory Visit: Payer: PRIVATE HEALTH INSURANCE

## 2021-03-11 ENCOUNTER — Ambulatory Visit: Payer: PRIVATE HEALTH INSURANCE

## 2021-03-11 ENCOUNTER — Other Ambulatory Visit: Payer: Self-pay

## 2021-03-11 DIAGNOSIS — G8929 Other chronic pain: Secondary | ICD-10-CM

## 2021-03-11 DIAGNOSIS — M545 Low back pain, unspecified: Secondary | ICD-10-CM | POA: Diagnosis not present

## 2021-03-11 DIAGNOSIS — M6281 Muscle weakness (generalized): Secondary | ICD-10-CM

## 2021-03-11 NOTE — Therapy (Signed)
[facility] PHYS[facility] SPORTS MEDICINE [address] [place], [address] Phone: [phone]   Fax:  [phone]  Physical Therapy Treatment  Patient Details  Name: Carly Suarez MRN: [id number] Date of Birth: March 11, 1986 Referring Provider (PT): Lonell Face, MD   Encounter Date: 03/11/2021   PT End of Session - 03/11/21 1716    Visit Number 10    Number of Visits 17    Date for PT Re-Evaluation 03/25/21    PT Start Time 1716    PT Stop Time 1819    PT Time Calculation (min) 63 min    Activity Tolerance Patient tolerated treatment well    Behavior During Therapy [facility] for tasks assessed/performed           Past Medical History:  Diagnosis Date  . ADD (attention deficit disorder)   . History of chicken pox   . History of shingles     Past Surgical History:  Procedure Laterality Date  . LEEP N/A 06/30/2014   Procedure: LOOP ELECTROSURGICAL EXCISION PROCEDURE (LEEP);  Surgeon: Annamaria Boots, MD;  Location: [facility];  Service: Gynecology;  Laterality: N/A;  . WISDOM TOOTH EXTRACTION  2005    There were no vitals filed for this visit.   Subjective Assessment - 03/11/21 1718    Subjective Still has R low back pull, 5/10 currently. 7/10 at most for the past 7 days. The equipment she has on her R side is heavier. Increased R low back pain when taking a step with her R foot.    Pertinent History Low back pain. Pain began years ago as long as she can remember. Pt got fed up with her back pain. Pt wears a uniform for work bothered her back and the belt she has to wear does not help.  Difficulty holding her bladder at times. Doctor does not know about it. Pt started having to wear liners recently (pt was recommended to tell her doctor about it. Pt verbalized understanding). Difficulty with bladder started within the last year. Back is progressively worsening. No known saddle anesthesia. No loss of strength from the waist down.    Patient  Stated Goals Decrease pain. Be able stand for an extended period of time.    Currently in Pain? Yes    Pain Score 5     Pain Onset More than a month ago                                     PT Education - 03/11/21 1832    Education Details ther-ex, HEP    Person(s) Educated Patient    Methods Explanation;Demonstration;Tactile cues;Verbal cues;Handout    Comprehension Returned demonstration;Verbalized understanding          Objective  [email or web address]  No latex band allergy Palpation: TTP R L5 transverse process in standing TTP L sacral Ala in prone Anterior L ASIS   Decreased B femoral control with stand to sit.  Difficulty with bladder started within the last year. Back is progressively worsening.   MedbdridgeAccess Code [email or web address]  Pt states doing gymnastics and dance when she was a kid  Manual therapy  Using massage chair STM B lumbar paraspinal muscles to d ecrease tension              Decreased pain afterwards  R UPA L4, L3 grade 3-   Therapeutic exercise Palpation: B lumbar  paraspinal muscle tension L > R  L S/L R bows and arrow 2x5 seconds. Increased symptoms  R S/L L bows and arrows 10x5 seconds   On massage chair  Transversus abdominis contraction 10x10 seconds   B shoulder extension gentle isometrics, hands on arm rest 10x5 seconds to promote rectus abdominis activation and decrease B thoracolumbar paraspinal muscle activation.   Seated thoracic extension over chair 10x5 seconds  Standing L lateral shift isometrics 10x5 seconds   S/L hip abduction   R 10x   L 10x  Reviewed and given as part of her HEP. Pt demonstrated and verbalized understanding. Handout provided.   Improved exercise technique, movement at target joints, use of target muscles aftermin tomod verbal, visual, tactile cues.   Response to treatment Pt tolerated session well without aggravation of symptoms.    Clinical impression Pt demonstrates R lumbar extensor muscle tension reproducing symptoms with palpation. Possible increased R hip hike during R LE swing phase of gait while wearing her gear at work secondary to the gear on her R side being heavier than the L. Pt reports increased pain with stepping forward with her R leg. Decreased muscle tension and pain reported with activation of transverus abdominis and abdominal muscles to promote reciprocal inhibition of back extensor muscles. Also worked on glute med muscle strengthening to help decrease trendelenburg gait pattern when ambulating with her gear on. Pt will benefit from continued skilled physical therapy services to decrease pain, improve strength and function.       PT Short Term Goals - 01/28/21 1852      PT SHORT TERM GOAL #1   Title Pt will be independent with her initial HEP to decrease pain, improve strength, and ability to perform work duties with less difficulty.    Time 3    Period Weeks    Status New    Target Date 02/18/21             PT Long Term Goals - 01/28/21 1853      PT LONG TERM GOAL #1   Title Pt will have a decrease in low back pain to 3/10 or less at worst to promote ability to perform work duties, wear her uniform, perform tasks which involve bending over as well as tolerate prolonged standing with less difficutly.    Baseline 9/10 low back pain at most for the past 3 months (01/28/2021)    Time 8    Period Weeks    Status New    Target Date 03/25/21      PT LONG TERM GOAL #2   Title Patient will improve bilateral hip extension and abduction strength by at least 1/2 MMT to promote ability to perform standing tasks with less back pain.    Baseline Hip extension 4/5 R, 4-/5 L, hip abduction 4/5 R and L (01/28/2021)    Time 8    Period Weeks    Status New    Target Date 03/25/21      PT LONG TERM GOAL #3   Title Patient will improve B hip IR ROM by at least 10 degrees to promote ability to perform  standing tasks with less back pain.    Baseline Hip IR: 17 degrees R, 16 degrees L (01/28/2021)    Time 8    Period Weeks    Status New    Target Date 03/25/21      PT LONG TERM GOAL #4   Title Patient will improve her Lumbar FOTO score  by at least 10 points as a demonstration of improved function.    Baseline FOTO emailed to pt secondary to ipad internet difficulties. (01/28/2021)    Time 8    Period Weeks    Status New    Target Date 03/25/21                 Plan - 03/11/21 1750    Clinical Impression Statement Pt demonstrates R lumbar extensor muscle tension reproducing symptoms with palpation. Possible increased R hip hike during R LE swing phase of gait while wearing her gear at work secondary to the gear on her R side being heavier than the L. Pt reports increased pain with stepping forward with her R leg. Decreased muscle tension and pain reported with activation of transverus abdominis and abdominal muscles to promote reciprocal inhibition of back extensor muscles. Also worked on glute med muscle strengthening to help decrease trendelenburg gait pattern when ambulating with her gear on. Pt will benefit from continued skilled physical therapy services to decrease pain, improve strength and function.    Personal Factors and Comorbidities Past/Current Experience;Profession;Time since onset of injury/illness/exacerbation    Examination-Activity Limitations Bed Mobility;Bend;Carry;Lift;Locomotion Level;Stand;Transfers    Stability/Clinical Decision Making Stable/Uncomplicated   pain is worsening per pt.   Rehab Potential Fair    PT Frequency 2x / week    PT Duration 8 weeks    PT Treatment/Interventions Therapeutic exercise;Therapeutic activities;Neuromuscular re-education;Patient/family education;Manual techniques;Dry needling;Spinal Manipulations;Joint Manipulations;Aquatic Therapy;Biofeedback;Electrical Stimulation;Iontophoresis 4mg /ml Dexamethasone;Traction    PT Next Visit Plan  posture, core, hip strength, femoral control, lumbopelvic control, manual techniques, modalities PRN    Consulted and Agree with Plan of Care Patient           Patient will benefit from skilled therapeutic intervention in order to improve the following deficits and impairments:  Pain,Postural dysfunction,Improper body mechanics,Decreased strength  Visit Diagnosis: Chronic bilateral low back pain, unspecified whether sciatica present  Muscle weakness (generalized)     Problem List Patient Active Problem List   Diagnosis Date Noted  . Elevated blood pressure reading without diagnosis of hypertension 02/23/2021  . ADD (attention deficit disorder) 01/29/2021  . Positive ANA (antinuclear antibody) 05/08/2020  . Chronic neck pain 05/08/2020  . Bilateral hand pain 04/28/2020  . Arthralgia 04/28/2020  . Vulvar cyst 05/13/2019  . Metatarsalgia of right foot 08/07/2018  . Depression, major, single episode, moderate (HCC) 04/20/2017  . GAD (generalized anxiety disorder) 04/20/2017  . Cervical disc disease 04/20/2017  . Abdominal pain, epigastric - episodic, ? gallstones 10/28/2015  . CIN III (cervical intraepithelial neoplasia III) 07/31/2014  . HSV-2 (herpes simplex virus 2) infection 05/14/2014    05/16/2014 PT, DPT   03/11/2021, 6:37 PM  [place] [facility] PHYSICAL AND SPORTS MEDICINE [address], [place] Phone: [phone]   Fax:  [phone]  Name: Carly Suarez MRN: [name] Date of Birth: December 10, 1985

## 2021-03-16 ENCOUNTER — Other Ambulatory Visit: Payer: Self-pay

## 2021-03-16 ENCOUNTER — Ambulatory Visit: Payer: PRIVATE HEALTH INSURANCE

## 2021-03-16 DIAGNOSIS — G8929 Other chronic pain: Secondary | ICD-10-CM

## 2021-03-16 DIAGNOSIS — M545 Low back pain, unspecified: Secondary | ICD-10-CM | POA: Diagnosis not present

## 2021-03-16 DIAGNOSIS — M6281 Muscle weakness (generalized): Secondary | ICD-10-CM

## 2021-03-16 NOTE — Therapy (Signed)
[place] [facility] [address], [place], [address] Phone: [phone]   Fax:  [phone]  Physical Therapy Treatment  Patient Details  Name: Carly Suarez MRN: [id number] Date of Birth: 1986-10-28 Referring Provider (PT): Lonell Face, MD   Encounter Date: 03/16/2021   PT End of Session - 03/16/21 1723    Visit Number 11    Number of Visits 17    Date for PT Re-Evaluation 03/25/21    PT Start Time 1723    PT Stop Time 1818    PT Time Calculation (min) 55 min    Activity Tolerance Patient tolerated treatment well    Behavior During Therapy [facility] for tasks assessed/performed           Past Medical History:  Diagnosis Date  . ADD (attention deficit disorder)   . History of chicken pox   . History of shingles     Past Surgical History:  Procedure Laterality Date  . LEEP N/A 06/30/2014   Procedure: LOOP ELECTROSURGICAL EXCISION PROCEDURE (LEEP);  Surgeon: Annamaria Boots, MD;  Location: [facility];  Service: Gynecology;  Laterality: N/A;  . WISDOM TOOTH EXTRACTION  2005    There were no vitals filed for this visit.   Subjective Assessment - 03/16/21 1724    Subjective Pt thinks back is doing better. Slowly loosening up. 5/10 currently. 8/10 at most for the past 7 days.    Pertinent History Low back pain. Pain began years ago as long as she can remember. Pt got fed up with her back pain. Pt wears a uniform for work bothered her back and the belt she has to wear does not help.  Difficulty holding her bladder at times. Doctor does not know about it. Pt started having to wear liners recently (pt was recommended to tell her doctor about it. Pt verbalized understanding). Difficulty with bladder started within the last year. Back is progressively worsening. No known saddle anesthesia. No loss of strength from the waist down.    Patient Stated Goals Decrease pain. Be able stand for an extended period of time.     Currently in Pain? Yes    Pain Score 5     Pain Onset More than a month ago                                     PT Education - 03/16/21 1728    Education Details ther-ex, HEP    Person(s) Educated Patient    Methods Explanation;Demonstration;Tactile cues;Verbal cues;Handout    Comprehension Returned demonstration;Verbalized understanding           Objective  [email or web address]  No latex band allergy Palpation: TTP R L5 transverse process in standing TTP L sacral Ala in prone Anterior L ASIS   Decreased B femoral control with stand to sit.  Difficulty with bladder started within the last year. Back is progressively worsening.   MedbdridgeAccess Code [email or web address]  Pt states doing gymnastics and dance when she was a kid  Manual therapy Using massage chair  R UPA L4 TP grade 3- to 3+   STM R  lumbar paraspinal muscles to decrease tension     Therapeutic exercise   Child's pose stretch with L side bending to stretch R low back 30 seconds x 3   Supine posterior pelvic tilt with B double leg left 1 inch 10x  Crunches  Forward 10x  R 10x2  L 10x2   S/L hip abduction              R 10x, then 10x5 seconds for 2 sets             L 10x, then 10x5 seconds for 2 sets  Standing L trunk side bend to stretch R  5x 5-10 seconds   Sitting on massage chair   B shoulder extension isometrics 10x5 seconds for 2 sets   Decreased muscle tension R low back afterwards   Seated B shoulder extension isometrics, hands on thighs 10x10 seconds   Reviewed and given as part of her HEP. Pt demonstrated and verbalized understanding. Handout provided.   Reviewed plan of care with pt. Continue 2x/week until the end of May to continue to work on decreasing pain and improving trunk strength.     Improved exercise technique, movement at target joints, use of target muscles after mod verbal, visual, tactile cues.      Response to  treatment Decreased pain with abdominal muscle activation.   Clinical impression R lumbar paraspinal muscle tension with reproduction of symptoms with palpation. Decreased discomfort with reciprocal inhibition via activation of abdominal muscles. Pt will benefit from continued skilled physical therapy services to decrease pain, improve strength and function.       PT Short Term Goals - 01/28/21 1852      PT SHORT TERM GOAL #1   Title Pt will be independent with her initial HEP to decrease pain, improve strength, and ability to perform work duties with less difficulty.    Time 3    Period Weeks    Status New    Target Date 02/18/21             PT Long Term Goals - 01/28/21 1853      PT LONG TERM GOAL #1   Title Pt will have a decrease in low back pain to 3/10 or less at worst to promote ability to perform work duties, wear her uniform, perform tasks which involve bending over as well as tolerate prolonged standing with less difficutly.    Baseline 9/10 low back pain at most for the past 3 months (01/28/2021)    Time 8    Period Weeks    Status New    Target Date 03/25/21      PT LONG TERM GOAL #2   Title Patient will improve bilateral hip extension and abduction strength by at least 1/2 MMT to promote ability to perform standing tasks with less back pain.    Baseline Hip extension 4/5 R, 4-/5 L, hip abduction 4/5 R and L (01/28/2021)    Time 8    Period Weeks    Status New    Target Date 03/25/21      PT LONG TERM GOAL #3   Title Patient will improve B hip IR ROM by at least 10 degrees to promote ability to perform standing tasks with less back pain.    Baseline Hip IR: 17 degrees R, 16 degrees L (01/28/2021)    Time 8    Period Weeks    Status New    Target Date 03/25/21      PT LONG TERM GOAL #4   Title Patient will improve her Lumbar FOTO score by at least 10 points as a demonstration of improved function.    Baseline FOTO emailed to pt secondary to ipad internet  difficulties. (01/28/2021)  Time 8    Period Weeks    Status New    Target Date 03/25/21                 Plan - 03/16/21 1826    Clinical Impression Statement R lumbar paraspinal muscle tension with reproduction of symptoms with palpation. Decreased discomfort with reciprocal inhibition via activation of abdominal muscles. Pt will benefit from continued skilled physical therapy services to decrease pain, improve strength and function.    Personal Factors and Comorbidities Past/Current Experience;Profession;Time since onset of injury/illness/exacerbation    Examination-Activity Limitations Bed Mobility;Bend;Carry;Lift;Locomotion Level;Stand;Transfers    Stability/Clinical Decision Making Stable/Uncomplicated   pain is worsening per pt.   Rehab Potential Fair    PT Frequency 2x / week    PT Duration 8 weeks    PT Treatment/Interventions Therapeutic exercise;Therapeutic activities;Neuromuscular re-education;Patient/family education;Manual techniques;Dry needling;Spinal Manipulations;Joint Manipulations;Aquatic Therapy;Biofeedback;Electrical Stimulation;Iontophoresis 4mg /ml Dexamethasone;Traction    PT Next Visit Plan posture, core, hip strength, femoral control, lumbopelvic control, manual techniques, modalities PRN    Consulted and Agree with Plan of Care Patient           Patient will benefit from skilled therapeutic intervention in order to improve the following deficits and impairments:  Pain,Postural dysfunction,Improper body mechanics,Decreased strength  Visit Diagnosis: Chronic bilateral low back pain, unspecified whether sciatica present  Muscle weakness (generalized)     Problem List Patient Active Problem List   Diagnosis Date Noted  . Elevated blood pressure reading without diagnosis of hypertension 02/23/2021  . ADD (attention deficit disorder) 01/29/2021  . Positive ANA (antinuclear antibody) 05/08/2020  . Chronic neck pain 05/08/2020  . Bilateral hand pain  04/28/2020  . Arthralgia 04/28/2020  . Vulvar cyst 05/13/2019  . Metatarsalgia of right foot 08/07/2018  . Depression, major, single episode, moderate (HCC) 04/20/2017  . GAD (generalized anxiety disorder) 04/20/2017  . Cervical disc disease 04/20/2017  . Abdominal pain, epigastric - episodic, ? gallstones 10/28/2015  . CIN III (cervical intraepithelial neoplasia III) 07/31/2014  . HSV-2 (herpes simplex virus 2) infection 05/14/2014    05/16/2014 PT, DPT   03/16/2021, 6:31 PM  [place] [facility] PH[facility] SPORTS MEDICINE [address], [address], [place] Phone: [phone]   Fax:  [phone]  Name: Carly Suarez MRN: [name] Date of Birth: 07-Oct-1986

## 2021-03-16 NOTE — Patient Instructions (Addendum)
  Sitting on a chair    Press your hands on your thighs to feel your abdominal muscles contract.   Hold for 10 seconds comfortably.   Repeat 10 times.   Perform 3 sets daily.      Access Code: [email or web address] URL: [email or web address] Date: 03/16/2021 Prepared by: Loralyn Freshwater  Exercises Supine Transversus Abdominis Bracing - Hands on Ground - 1 x daily - 7 x weekly - 3 sets - 10 reps - 5 hold Curl Up with Arms Crossed - 1 x daily - 7 x weekly - 3 sets - 10 reps Sidelying Hip Abduction - 1 x daily - 7 x weekly - 3 sets - 10 reps Child's Pose with Sidebending - 3 x daily - 7 x weekly - 1 sets - 3 reps - 30 seconds hold

## 2021-03-18 ENCOUNTER — Ambulatory Visit: Payer: PRIVATE HEALTH INSURANCE

## 2021-03-18 ENCOUNTER — Other Ambulatory Visit: Payer: Self-pay

## 2021-03-18 DIAGNOSIS — M6281 Muscle weakness (generalized): Secondary | ICD-10-CM

## 2021-03-18 DIAGNOSIS — M545 Low back pain, unspecified: Secondary | ICD-10-CM | POA: Diagnosis not present

## 2021-03-18 DIAGNOSIS — G8929 Other chronic pain: Secondary | ICD-10-CM

## 2021-03-18 NOTE — Therapy (Signed)
[place] [facility]ORTS MEDIC[facility] [address] [place], [address] Phone: [phone]   Fax:  [phone]  Physical Therapy Treatment  Patient Details  Name: Carly Suarez MRN: [id number] Date of Birth: [birth year] Referring Provider (PT): Lonell Face, MD   Encounter Date: 03/18/2021   PT End of Session - 03/18/21 1722    Visit Number 12    Number of Visits 17    Date for PT Re-Evaluation 03/25/21    PT Start Time 1722   pt arrived late   PT Stop Time 1804    PT Time Calculation (min) 42 min    Activity Tolerance Patient tolerated treatment well    Behavior During Therapy [facility] for tasks assessed/performed           Past Medical History:  Diagnosis Date  . ADD (attention deficit disorder)   . History of chicken pox   . History of shingles     Past Surgical History:  Procedure Laterality Date  . LEEP N/A 06/30/2014   Procedure: LOOP ELECTROSURGICAL EXCISION PROCEDURE (LEEP);  Surgeon: Annamaria Boots, MD;  Location: [facility];  Service: Gynecology;  Laterality: N/A;  . WISDOM TOOTH EXTRACTION  2005    There were no vitals filed for this visit.   Subjective Assessment - 03/18/21 1723    Subjective Back is annoying constant pain. 7/10 currently. Pt woke up yesterday without pain. Pain returned when she got out of bed and stretched. Pain slowly started creaping back up.    Pertinent History Low back pain. Pain began years ago as long as she can remember. Pt got fed up with her back pain. Pt wears a uniform for work bothered her back and the belt she has to wear does not help.  Difficulty holding her bladder at times. Doctor does not know about it. Pt started having to wear liners recently (pt was recommended to tell her doctor about it. Pt verbalized understanding). Difficulty with bladder started within the last year. Back is progressively worsening. No known saddle anesthesia. No loss of strength from the waist down.     Patient Stated Goals Decrease pain. Be able stand for an extended period of time.    Currently in Pain? Yes    Pain Score 7     Pain Onset More than a month ago                                     PT Education - 03/18/21 1738    Education Details ther-ex    Person(s) Educated Patient    Methods Explanation;Demonstration;Tactile cues;Verbal cues    Comprehension Returned demonstration;Verbalized understanding          Objective  [email or web address]  No latex band allergy Palpation: TTP R L5 transverse process in standing TTP L sacral Ala in prone Anterior L ASIS   Decreased B femoral control with stand to sit.  Difficulty with bladder started within the last year. Back is progressively worsening.   MedbdridgeAccess Code [email or web address]  Pt states doing gymnastics and dance when she was a kid   Therapeutic exercise  Seated manually resisted L lateral shift isometrics in neutral 10x5 seconds for 2 sets  Then 30 seconds x 3  Side planks, knees bent  10 seconds x 5  Forward planks 10 seconds, then 5 seconds x 5  Supine posterior pelvic tilt with B double  leg left 1 inch 10x3   S/L hip abduction  R 10x10 seconds L 10x10 seconds  Crunches             Forward 10x3             R 10x3             L 10x3  Seated hip hinging with PVC rod  10x3  No back pain with bending over.   Improved exercise technique, movement at target joints, use of target muscles after mod verbal, visual, tactile cues.      Response to treatment Decreased pain with abdominal muscle activation.   Clinical impression Pt making progress with decreased back pain with reports of waking up yesterday without back pain for the first time in a long time. Pain seems to have returns with standing lumbar flexion. Worked on lumbopelvic control with hip hinging as well in addition to core strengthening to decrease stress to R low  back. Pt tolerated session well without aggravation of symptoms. Pt will benefit from continued skilled physical therapy services to decrease pain, improve strength and function.     PT Short Term Goals - 01/28/21 1852      PT SHORT TERM GOAL #1   Title Pt will be independent with her initial HEP to decrease pain, improve strength, and ability to perform work duties with less difficulty.    Time 3    Period Weeks    Status New    Target Date 02/18/21             PT Long Term Goals - 01/28/21 1853      PT LONG TERM GOAL #1   Title Pt will have a decrease in low back pain to 3/10 or less at worst to promote ability to perform work duties, wear her uniform, perform tasks which involve bending over as well as tolerate prolonged standing with less difficutly.    Baseline 9/10 low back pain at most for the past 3 months (01/28/2021)    Time 8    Period Weeks    Status New    Target Date 03/25/21      PT LONG TERM GOAL #2   Title Patient will improve bilateral hip extension and abduction strength by at least 1/2 MMT to promote ability to perform standing tasks with less back pain.    Baseline Hip extension 4/5 R, 4-/5 L, hip abduction 4/5 R and L (01/28/2021)    Time 8    Period Weeks    Status New    Target Date 03/25/21      PT LONG TERM GOAL #3   Title Patient will improve B hip IR ROM by at least 10 degrees to promote ability to perform standing tasks with less back pain.    Baseline Hip IR: 17 degrees R, 16 degrees L (01/28/2021)    Time 8    Period Weeks    Status New    Target Date 03/25/21      PT LONG TERM GOAL #4   Title Patient will improve her Lumbar FOTO score by at least 10 points as a demonstration of improved function.    Baseline FOTO emailed to pt secondary to ipad internet difficulties. (01/28/2021)    Time 8    Period Weeks    Status New    Target Date 03/25/21                 Plan - 03/18/21  1738    Clinical Impression Statement Pt making  progress with decreased back pain with reports of waking up yesterday without back pain for the first time in a long time. Pain seems to have returns with standing lumbar flexion. Worked on lumbopelvic control with hip hinging as well in addition to core strengthening to decrease stress to R low back. Pt tolerated session well without aggravation of symptoms. Pt will benefit from continued skilled physical therapy services to decrease pain, improve strength and function.    Personal Factors and Comorbidities Past/Current Experience;Profession;Time since onset of injury/illness/exacerbation    Examination-Activity Limitations Bed Mobility;Bend;Carry;Lift;Locomotion Level;Stand;Transfers    Stability/Clinical Decision Making Stable/Uncomplicated   pain is worsening per pt.   Clinical Decision Making Low    Rehab Potential Fair    PT Frequency 2x / week    PT Duration 8 weeks    PT Treatment/Interventions Therapeutic exercise;Therapeutic activities;Neuromuscular re-education;Patient/family education;Manual techniques;Dry needling;Spinal Manipulations;Joint Manipulations;Aquatic Therapy;Biofeedback;Electrical Stimulation;Iontophoresis 4mg /ml Dexamethasone;Traction    PT Next Visit Plan posture, core, hip strength, femoral control, lumbopelvic control, manual techniques, modalities PRN    Consulted and Agree with Plan of Care Patient           Patient will benefit from skilled therapeutic intervention in order to improve the following deficits and impairments:  Pain,Postural dysfunction,Improper body mechanics,Decreased strength  Visit Diagnosis: Chronic bilateral low back pain, unspecified whether sciatica present  Muscle weakness (generalized)     Problem List Patient Active Problem List   Diagnosis Date Noted  . Elevated blood pressure reading without diagnosis of hypertension 02/23/2021  . ADD (attention deficit disorder) 01/29/2021  . Positive ANA (antinuclear antibody) 05/08/2020  .  Chronic neck pain 05/08/2020  . Bilateral hand pain 04/28/2020  . Arthralgia 04/28/2020  . Vulvar cyst 05/13/2019  . Metatarsalgia of right foot 08/07/2018  . Depression, major, single episode, moderate (HCC) 04/20/2017  . GAD (generalized anxiety disorder) 04/20/2017  . Cervical disc disease 04/20/2017  . Abdominal pain, epigastric - episodic, ? gallstones 10/28/2015  . CIN III (cervical intraepithelial neoplasia III) 07/31/2014  . HSV-2 (herpes simplex virus 2) infection 05/14/2014    05/16/2014 PT, DPT   03/18/2021, 6:22 PM   [place] [facility] PHYSICAL AND SPORTS MEDICINE [address], [place] Phone: [phone]   Fax:  [phone]  Name: DAWNETTA COPENHAVER MRN: [name] Date of Birth: 1986/05/27

## 2021-03-21 ENCOUNTER — Other Ambulatory Visit: Payer: Self-pay | Admitting: Family Medicine

## 2021-03-22 ENCOUNTER — Ambulatory Visit: Payer: PRIVATE HEALTH INSURANCE | Attending: Neurology

## 2021-03-22 ENCOUNTER — Other Ambulatory Visit: Payer: Self-pay

## 2021-03-22 DIAGNOSIS — M545 Low back pain, unspecified: Secondary | ICD-10-CM | POA: Insufficient documentation

## 2021-03-22 DIAGNOSIS — G8929 Other chronic pain: Secondary | ICD-10-CM | POA: Insufficient documentation

## 2021-03-22 DIAGNOSIS — M6281 Muscle weakness (generalized): Secondary | ICD-10-CM | POA: Insufficient documentation

## 2021-03-22 NOTE — Patient Instructions (Signed)
  Gave childs pose to the L to stretch R low back as part of HEP. Pt demonstrated and verbalized understanding.

## 2021-03-22 NOTE — Therapy (Signed)
[facility] [address] [place], [address] Phone: [phone]   Fax:  [phone]  Physical Therapy Treatment  Patient Details  Name: Carly Suarez MRN: [id number] Date of Birth: May 24, 1986 Referring Provider (PT): Lonell Face, MD   Encounter Date: 03/22/2021   PT End of Session - 03/22/21 1720    Visit Number 13    Number of Visits 17    Date for PT Re-Evaluation 03/25/21    PT Start Time 1720    PT Stop Time 1802    PT Time Calculation (min) 42 min    Activity Tolerance Patient tolerated treatment well    Behavior During Therapy [facility] for tasks assessed/performed           Past Medical History:  Diagnosis Date  . ADD (attention deficit disorder)   . History of chicken pox   . History of shingles     Past Surgical History:  Procedure Laterality Date  . LEEP N/A 06/30/2014   Procedure: LOOP ELECTROSURGICAL EXCISION PROCEDURE (LEEP);  Surgeon: Annamaria Boots, MD;  Location: [facility];  Service: Gynecology;  Laterality: N/A;  . WISDOM TOOTH EXTRACTION  2005    There were no vitals filed for this visit.   Subjective Assessment - 03/22/21 1722    Subjective Back feels same. 5/10 currently.    Pertinent History Low back pain. Pain began years ago as long as she can remember. Pt got fed up with her back pain. Pt wears a uniform for work bothered her back and the belt she has to wear does not help.  Difficulty holding her bladder at times. Doctor does not know about it. Pt started having to wear liners recently (pt was recommended to tell her doctor about it. Pt verbalized understanding). Difficulty with bladder started within the last year. Back is progressively worsening. No known saddle anesthesia. No loss of strength from the waist down.    Patient Stated Goals Decrease pain. Be able stand for an extended period of time.    Currently in Pain? Yes    Pain Score 5     Pain Onset More than a month  ago                                     PT Education - 03/22/21 1723    Education Details ther-ex    Person(s) Educated Patient    Methods Explanation;Demonstration;Tactile cues;Verbal cues    Comprehension Returned demonstration;Verbalized understanding           Objective  [email or web address]  No latex band allergy Palpation: TTP R L5 transverse process in standing TTP L sacral Ala in prone Anterior L ASIS   Decreased B femoral control with stand to sit.  Difficulty with bladder started within the last year. Back is progressively worsening.   MedbdridgeAccess Code [email or web address]  Pt states doing gymnastics and dance when she was a kid   Manual therapy Using massage chair  R UPA L3 TP grade 3- to 3+    STM R  lumbar paraspinal muscles to decrease tension   Seated STM L lumbar paraspinal muscle to decrease tension.      Therapeutic exercise  Seated manually resisted L lateral shift isometrics in neutral 10x5 seconds, then 8x5 seconds  Prone on elbows  Increased back pain, eases with rest   Prone transversus abdominis contraction 10x5 seconds  Sitting with strap around abdomen. Increased discomfort  Seated trunk flexion to the L 10x5 seconds   Felt good per pt.   Improved exercise technique, movement at target joints, use of target muscles after mod verbal, visual, tactile cues.     Response to treatment Decreased R lumbar tension with stretch.   Clinical impression Difficulty decreasing back pain today with manual therapy overall. Temporary decrease in R TTP to R L3 TP after UPA. Decreased R lumbar tension with seated L trunk flexion to stretch R low back. Relief seems temporary for now. PT will benefit from continued skilled physical therapy services to decrease pain, improve strength and function.       PT Short Term Goals - 01/28/21 1852      PT SHORT TERM GOAL #1   Title Pt will be  independent with her initial HEP to decrease pain, improve strength, and ability to perform work duties with less difficulty.    Time 3    Period Weeks    Status New    Target Date 02/18/21             PT Long Term Goals - 01/28/21 1853      PT LONG TERM GOAL #1   Title Pt will have a decrease in low back pain to 3/10 or less at worst to promote ability to perform work duties, wear her uniform, perform tasks which involve bending over as well as tolerate prolonged standing with less difficutly.    Baseline 9/10 low back pain at most for the past 3 months (01/28/2021)    Time 8    Period Weeks    Status New    Target Date 03/25/21      PT LONG TERM GOAL #2   Title Patient will improve bilateral hip extension and abduction strength by at least 1/2 MMT to promote ability to perform standing tasks with less back pain.    Baseline Hip extension 4/5 R, 4-/5 L, hip abduction 4/5 R and L (01/28/2021)    Time 8    Period Weeks    Status New    Target Date 03/25/21      PT LONG TERM GOAL #3   Title Patient will improve B hip IR ROM by at least 10 degrees to promote ability to perform standing tasks with less back pain.    Baseline Hip IR: 17 degrees R, 16 degrees L (01/28/2021)    Time 8    Period Weeks    Status New    Target Date 03/25/21      PT LONG TERM GOAL #4   Title Patient will improve her Lumbar FOTO score by at least 10 points as a demonstration of improved function.    Baseline FOTO emailed to pt secondary to ipad internet difficulties. (01/28/2021)    Time 8    Period Weeks    Status New    Target Date 03/25/21                 Plan - 03/22/21 1723    Clinical Impression Statement Difficulty decreasing back pain today with manual therapy overall. Temporary decrease in R TTP to R L3 TP after UPA. Decreased R lumbar tension with seated L trunk flexion to stretch R low back. Relief seems temporary for now. PT will benefit from continued skilled physical therapy  services to decrease pain, improve strength and function.    Personal Factors and Comorbidities Past/Current Experience;Profession;Time since onset of injury/illness/exacerbation  Examination-Activity Limitations Bed Mobility;Bend;Carry;Lift;Locomotion Level;Stand;Transfers    Stability/Clinical Decision Making Stable/Uncomplicated   pain is worsening per pt.   Rehab Potential Fair    PT Frequency 2x / week    PT Duration 8 weeks    PT Treatment/Interventions Therapeutic exercise;Therapeutic activities;Neuromuscular re-education;Patient/family education;Manual techniques;Dry needling;Spinal Manipulations;Joint Manipulations;Aquatic Therapy;Biofeedback;Electrical Stimulation;Iontophoresis 4mg /ml Dexamethasone;Traction    PT Next Visit Plan posture, core, hip strength, femoral control, lumbopelvic control, manual techniques, modalities PRN    Consulted and Agree with Plan of Care Patient           Patient will benefit from skilled therapeutic intervention in order to improve the following deficits and impairments:  Pain,Postural dysfunction,Improper body mechanics,Decreased strength  Visit Diagnosis: Chronic bilateral low back pain, unspecified whether sciatica present  Muscle weakness (generalized)     Problem List Patient Active Problem List   Diagnosis Date Noted  . Elevated blood pressure reading without diagnosis of hypertension 02/23/2021  . ADD (attention deficit disorder) 01/29/2021  . Positive ANA (antinuclear antibody) 05/08/2020  . Chronic neck pain 05/08/2020  . Bilateral hand pain 04/28/2020  . Arthralgia 04/28/2020  . Vulvar cyst 05/13/2019  . Metatarsalgia of right foot 08/07/2018  . Depression, major, single episode, moderate (HCC) 04/20/2017  . GAD (generalized anxiety disorder) 04/20/2017  . Cervical disc disease 04/20/2017  . Abdominal pain, epigastric - episodic, ? gallstones 10/28/2015  . CIN III (cervical intraepithelial neoplasia III) 07/31/2014  .  HSV-2 (herpes simplex virus 2) infection 05/14/2014    05/16/2014 PT, DPT   03/22/2021, 6:18 PM  [facility] PH[facility] AND SPORTS MEDICINE [address], [place] Phone: [phone]   Fax:  [phone]  Name: PATSEY PITSTICK MRN: [name] Date of Birth: December 13, 1985

## 2021-03-24 ENCOUNTER — Ambulatory Visit: Payer: PRIVATE HEALTH INSURANCE

## 2021-03-26 ENCOUNTER — Ambulatory Visit: Payer: PRIVATE HEALTH INSURANCE | Admitting: Family Medicine

## 2021-03-29 ENCOUNTER — Other Ambulatory Visit: Payer: Self-pay

## 2021-03-29 ENCOUNTER — Ambulatory Visit: Payer: PRIVATE HEALTH INSURANCE

## 2021-03-29 DIAGNOSIS — M6281 Muscle weakness (generalized): Secondary | ICD-10-CM

## 2021-03-29 DIAGNOSIS — M545 Low back pain, unspecified: Secondary | ICD-10-CM

## 2021-03-29 DIAGNOSIS — G8929 Other chronic pain: Secondary | ICD-10-CM

## 2021-03-29 NOTE — Patient Instructions (Signed)
Access Code: [email or web address] URL: [email or web address] Date: 03/29/2021 Prepared by: Loralyn Freshwater  Exercises Supine Transversus Abdominis Bracing - Hands on Ground - 1 x daily - 7 x weekly - 3 sets - 10 reps - 5 hold Curl Up with Arms Crossed - 1 x daily - 7 x weekly - 3 sets - 10 reps Sidelying Hip Abduction - 1 x daily - 7 x weekly - 3 sets - 10 reps Child's Pose with Sidebending - 3 x daily - 7 x weekly - 1 sets - 3 reps - 30 seconds hold Seated Hip Hinge with Dowel - 1 x daily - 7 x weekly - 3 sets - 10 reps Seated Piriformis Stretch - 2 x daily - 7 x weekly - 1 sets - 6 reps - 30 hold

## 2021-03-29 NOTE — Therapy (Signed)
[facility] SPORTS [facility]IC[facility] [address], [place], [address] Phone: [phone]   Fax:  [phone]  Physical Therapy Treatment  Patient Details  Name: Carly Suarez MRN: [id number] Date of Birth: 1986/10/25 Referring Provider (PT): Vladimir Crofts, MD   Encounter Date: 03/29/2021   PT End of Session - 03/29/21 1726    Visit Number 14    Number of Visits 25    Date for PT Re-Evaluation 04/22/21    PT Start Time 1726    PT Stop Time 1809    PT Time Calculation (min) 43 min    Activity Tolerance Patient tolerated treatment well    Behavior During Therapy [facility] for tasks assessed/performed           Past Medical History:  Diagnosis Date  . ADD (attention deficit disorder)   . History of chicken pox   . History of shingles     Past Surgical History:  Procedure Laterality Date  . LEEP N/A 06/30/2014   Procedure: LOOP ELECTROSURGICAL EXCISION PROCEDURE (LEEP);  Surgeon: Lyman Speller, MD;  Location: [facility];  Service: Gynecology;  Laterality: N/A;  . WISDOM TOOTH EXTRACTION  2005    There were no vitals filed for this visit.   Subjective Assessment - 03/29/21 1728    Subjective Back is getter there. Its better. That spot is still hanging on. Very slowly letting go. 4/10 currently. Feels it more when she leans forward.    Pertinent History Low back pain. Pain began years ago as long as she can remember. Pt got fed up with her back pain. Pt wears a uniform for work bothered her back and the belt she has to wear does not help.  Difficulty holding her bladder at times. Doctor does not know about it. Pt started having to wear liners recently (pt was recommended to tell her doctor about it. Pt verbalized understanding). Difficulty with bladder started within the last year. Back is progressively worsening. No known saddle anesthesia. No loss of strength from the waist down.    Patient Stated Goals Decrease pain. Be able stand  for an extended period of time.    Currently in Pain? Yes    Pain Score 4     Pain Onset More than a month ago                                     PT Education - 03/29/21 1821    Education Details ther-ex, HEP    Person(s) Educated Patient    Methods Explanation;Demonstration;Tactile cues;Verbal cues;Handout    Comprehension Verbalized understanding;Returned demonstration           Objective  [email or web address]  No latex band allergy Palpation: TTP R L5 transverse process in standing TTP L sacral Ala in prone Anterior L ASIS   Decreased B femoral control with stand to sit.  Difficulty with bladder started within the last year. Back is progressively worsening.   MedbdridgeAccess Code [email or web address]  Pt states doing gymnastics and dance when she was a kid   Therapeutic exercise  Manually resisted prone hip extension, S/L hip abduction 1-2x each way for each LE   Prone glute max extension   R 10x then 10x5 seconds for 2 sets  L 10x then 10x5 seconds for 2 sets  Prone scaption  R 10x 5 second holds  L 10x5 seconds holds  Prone alternate shoulder flexion with contralateral hip extension 10x3 each side   Seated manually resisted L upper trunk rotation to promote R low back rotation to promote more netural posture  10x5 seconds for 3 sets     Supine hip IR at 90/90  R 17 degrees with R low back pain  L 20 degrees   Supine R hip IR stretch with PT 1 minute x 3  Supine R piriformis stretch 30 seconds x 3    Decreased R low back pain after piriformis stretch.   Improved exercise technique, movement at target joints, use of target muscles after mod verbal, visual, tactile cues.     Response to treatment Pt tolerated session well without aggravation of symptoms.   Clinical impression Pt demonstrates some decrease on low back pain to 7/10 at most compared to her starting level of 9/10 at most. Pt also demonstrates overall  improved bilateral hip strength since initial evaluation. Decreased low back pain with lumbar flexion after treatment to decrease L lumbar rotation as well as improving R hip IR ROM. Challenges to progress include chronicity of condition as well as the heavy gear pt has to wear for work. Pt will benefit from continued skilled physical therapy services to decrease pain, improve strength and function.        PT Short Term Goals - 03/29/21 1729      PT SHORT TERM GOAL #1   Title Pt will be independent with her initial HEP to decrease pain, improve strength, and ability to perform work duties with less difficulty.    Baseline Has been doing her HEP, no questions (03/29/2021)    Time 3    Period Weeks    Status Achieved    Target Date 02/18/21             PT Long Term Goals - 03/29/21 1729      PT LONG TERM GOAL #1   Title Pt will have a decrease in low back pain to 3/10 or less at worst to promote ability to perform work duties, wear her uniform, perform tasks which involve bending over as well as tolerate prolonged standing with less difficutly.    Baseline 9/10 low back pain at most for the past 3 months (01/28/2021); 7/10 at most for the past 7 days. (03/29/2021)    Time 4    Period Weeks    Status Partially Met    Target Date 04/22/21      PT LONG TERM GOAL #2   Title Patient will improve bilateral hip extension and abduction strength by at least 1/2 MMT to promote ability to perform standing tasks with less back pain.    Baseline Hip extension 4/5 R, 4-/5 L, hip abduction 4/5 R and L (01/28/2021); hip extension 4/5 R, 4+/5 L, hip abduction 5/5 R and L (03/29/2021)    Time 4    Period Weeks    Status Partially Met    Target Date 04/22/21      PT LONG TERM GOAL #3   Title Patient will improve B hip IR ROM by at least 10 degrees to promote ability to perform standing tasks with less back pain.    Baseline Hip IR: 17 degrees R, 16 degrees L (01/28/2021); 17 degrees R, 20 degrees L  (03/29/2021)    Time 4    Period Weeks    Status On-going    Target Date 04/22/21      PT LONG TERM GOAL #4  Title Patient will improve her Lumbar FOTO score by at least 10 points as a demonstration of improved function.    Baseline FOTO emailed to pt secondary to ipad internet difficulties. (01/28/2021); unable to assess secondary to initial FOTO unable to be obtained. Pt did not fill initial FOTO out (03/29/2021)    Time 8    Period Weeks    Status Unable to assess    Target Date 03/25/21                 Plan - 03/29/21 1815    Clinical Impression Statement Pt demonstrates some decrease on low back pain to 7/10 at most compared to her starting level of 9/10 at most. Pt also demonstrates overall improved bilateral hip strength since initial evaluation. Decreased low back pain with lumbar flexion after treatment to decrease L lumbar rotation as well as improving R hip IR ROM. Challenges to progress include chronicity of condition as well as the heavy gear pt has to wear for work. Pt will benefit from continued skilled physical therapy services to decrease pain, improve strength and function.    Personal Factors and Comorbidities Past/Current Experience;Profession;Time since onset of injury/illness/exacerbation    Examination-Activity Limitations Bed Mobility;Bend;Carry;Lift;Locomotion Level;Stand;Transfers    Stability/Clinical Decision Making Stable/Uncomplicated   pain is worsening per pt.   Clinical Decision Making Low    Rehab Potential Fair    PT Frequency 2x / week    PT Duration 4 weeks    PT Treatment/Interventions Therapeutic exercise;Therapeutic activities;Neuromuscular re-education;Patient/family education;Manual techniques;Dry needling;Spinal Manipulations;Joint Manipulations;Aquatic Therapy;Biofeedback;Electrical Stimulation;Iontophoresis 35m/ml Dexamethasone;Traction    PT Next Visit Plan posture, core, hip strength, femoral control, lumbopelvic control, manual techniques,  modalities PRN    Consulted and Agree with Plan of Care Patient           Patient will benefit from skilled therapeutic intervention in order to improve the following deficits and impairments:  Pain,Postural dysfunction,Improper body mechanics,Decreased strength  Visit Diagnosis: Chronic bilateral low back pain, unspecified whether sciatica present - Plan: PT plan of care cert/re-cert  Muscle weakness (generalized) - Plan: PT plan of care cert/re-cert     Problem List Patient Active Problem List   Diagnosis Date Noted  . Elevated blood pressure reading without diagnosis of hypertension 02/23/2021  . ADD (attention deficit disorder) 01/29/2021  . Positive ANA (antinuclear antibody) 05/08/2020  . Chronic neck pain 05/08/2020  . Bilateral hand pain 04/28/2020  . Arthralgia 04/28/2020  . Vulvar cyst 05/13/2019  . Metatarsalgia of right foot 08/07/2018  . [place] major[place], [place] 04/20[place]  . GAD (generalized anxiety disorder) 04/20/2017  . Cervical disc disease 04/20/2017  . Abdominal pain, epigastric - episodic, ? gallstones 10/28/2015  . CIN III (cervical intraepithelial neoplasia III) 07/31/2014  . HSV-2 (herpes simplex virus 2) infection 05/14/2014    MJoneen BoersPT, DPT   03/29/2021, 6:26 PM  [facility] [address] [place] [address]Phone: [phone]  Fax:  [phone] Name: Carly AARONMRN: [id number] of Birth: 411/28/1987

## 2021-03-30 ENCOUNTER — Ambulatory Visit: Payer: PRIVATE HEALTH INSURANCE | Admitting: Family Medicine

## 2021-03-31 ENCOUNTER — Ambulatory Visit: Payer: PRIVATE HEALTH INSURANCE

## 2021-04-05 ENCOUNTER — Ambulatory Visit: Payer: PRIVATE HEALTH INSURANCE

## 2021-04-07 ENCOUNTER — Other Ambulatory Visit: Payer: Self-pay

## 2021-04-07 ENCOUNTER — Ambulatory Visit: Payer: PRIVATE HEALTH INSURANCE

## 2021-04-07 DIAGNOSIS — M545 Low back pain, unspecified: Secondary | ICD-10-CM | POA: Diagnosis not present

## 2021-04-07 DIAGNOSIS — M6281 Muscle weakness (generalized): Secondary | ICD-10-CM

## 2021-04-07 NOTE — Therapy (Signed)
[facility] [address] [place], [address] Phone: [phone]   Fax:  [phone]  Physical Therapy Treatment  Patient Details  Name: Carly Suarez MRN: [id number] Date of Birth: 1986-08-03 Referring Provider (PT): Vladimir Crofts, MD   Encounter Date: 04/07/2021   PT End of Session - 04/07/21 1718    Visit Number 15    Number of Visits 25    Date for PT Re-Evaluation 04/22/21    PT Start Time 7035    PT Stop Time 1812    PT Time Calculation (min) 54 min    Activity Tolerance Patient tolerated treatment well    Behavior During Therapy [facility] for tasks assessed/performed           Past Medical History:  Diagnosis Date  . ADD (attention deficit disorder)   . History of chicken pox   . History of shingles     Past Surgical History:  Procedure Laterality Date  . LEEP N/A 06/30/2014   Procedure: LOOP ELECTROSURGICAL EXCISION PROCEDURE (LEEP);  Surgeon: Lyman Speller, MD;  Location: [facility];  Service: Gynecology;  Laterality: N/A;  . WISDOM TOOTH EXTRACTION  2005    There were no vitals filed for this visit.   Subjective Assessment - 04/07/21 1720    Subjective Low back is getting better. Want to graduate at her next appointment. Still has a twinge of pain but pt has enough exercises to continue progress at home. 4/10 at most for the past 7 days. Just wants to do the manual therapy today but will do exercises.    Pertinent History Low back pain. Pain began years ago as long as she can remember. Pt got fed up with her back pain. Pt wears a uniform for work bothered her back and the belt she has to wear does not help.  Difficulty holding her bladder at times. Doctor does not know about it. Pt started having to wear liners recently (pt was recommended to tell her doctor about it. Pt verbalized understanding). Difficulty with bladder started within the last year. Back is progressively worsening. No known  saddle anesthesia. No loss of strength from the waist down.    Patient Stated Goals Decrease pain. Be able stand for an extended period of time.    Currently in Pain? Yes    Pain Score 2     Pain Onset More than a month ago                                     PT Education - 04/07/21 1754    Education Details ther-ex    Person(s) Educated Patient    Methods Explanation;Demonstration;Tactile cues;Verbal cues    Comprehension Returned demonstration;Verbalized understanding          Objective  [email or web address]  No latex band allergy Palpation: TTP R L5 transverse process in standing TTP L sacral Ala in prone Anterior L ASIS   Decreased B femoral control with stand to sit.  Difficulty with bladder started within the last year. Back is progressively worsening.   MedbdridgeAccess Code [email or web address]  Pt states doing gymnastics and dance when she was a kid  Manual therapy Using massage chair  STM B lumbar paraspinal muscles to decrease tension   and fascial restrictions   R UPA L4, L3 grade 1+ to 3+  Decreased TTP    Therapeutic exercise  Sitting on massage chair: B shoulder extension isometrics 10x10 seconds for 2 sets  Seated R piriformis stretch 30 seconds x 3   Supine R piriformis stretch with PT 30 seconds x 3  Standing straight pallof press green band 10x5 seconds for 2 sets   Improved exercise technique, movement at target joints, use of target muscles after mod verbal, visual, tactile cues.    Response to treatment Fair tolerance to today's session.   Clinical impression Improved overall pain level based on subjective reports. Continued working on decreasing lumbar paraspinal muscle tension improving lumbar joint mobility, nutrition, blood flow as well as improving trunk muscle activation and R hip IR ROM to help decrease stress to low back. Fair tolerance to today's session. Pt will benefit from  continued skilled physical therapy services to decrease pain, improve strength and function.       PT Short Term Goals - 03/29/21 1729      PT SHORT TERM GOAL #1   Title Pt will be independent with her initial HEP to decrease pain, improve strength, and ability to perform work duties with less difficulty.    Baseline Has been doing her HEP, no questions (03/29/2021)    Time 3    Period Weeks    Status Achieved    Target Date 02/18/21             PT Long Term Goals - 03/29/21 1729      PT LONG TERM GOAL #1   Title Pt will have a decrease in low back pain to 3/10 or less at worst to promote ability to perform work duties, wear her uniform, perform tasks which involve bending over as well as tolerate prolonged standing with less difficutly.    Baseline 9/10 low back pain at most for the past 3 months (01/28/2021); 7/10 at most for the past 7 days. (03/29/2021)    Time 4    Period Weeks    Status Partially Met    Target Date 04/22/21      PT LONG TERM GOAL #2   Title Patient will improve bilateral hip extension and abduction strength by at least 1/2 MMT to promote ability to perform standing tasks with less back pain.    Baseline Hip extension 4/5 R, 4-/5 L, hip abduction 4/5 R and L (01/28/2021); hip extension 4/5 R, 4+/5 L, hip abduction 5/5 R and L (03/29/2021)    Time 4    Period Weeks    Status Partially Met    Target Date 04/22/21      PT LONG TERM GOAL #3   Title Patient will improve B hip IR ROM by at least 10 degrees to promote ability to perform standing tasks with less back pain.    Baseline Hip IR: 17 degrees R, 16 degrees L (01/28/2021); 17 degrees R, 20 degrees L (03/29/2021)    Time 4    Period Weeks    Status On-going    Target Date 04/22/21      PT LONG TERM GOAL #4   Title Patient will improve her Lumbar FOTO score by at least 10 points as a demonstration of improved function.    Baseline FOTO emailed to pt secondary to ipad internet difficulties. (01/28/2021); unable  to assess secondary to initial FOTO unable to be obtained. Pt did not fill initial FOTO out (03/29/2021)    Time 8    Period Weeks    Status Unable to assess    Target Date 03/25/21  Plan - 04/07/21 1754    Clinical Impression Statement Improved overall pain level based on subjective reports. Continued working on decreasing lumbar paraspinal muscle tension improving lumbar joint mobility, nutrition, blood flow as well as improving trunk muscle activation and R hip IR ROM to help decrease stress to low back. Fair tolerance to today's session. Pt will benefit from continued skilled physical therapy services to decrease pain, improve strength and function.    Personal Factors and Comorbidities Past/Current Experience;Profession;Time since onset of injury/illness/exacerbation    Examination-Activity Limitations Bed Mobility;Bend;Carry;Lift;Locomotion Level;Stand;Transfers    Stability/Clinical Decision Making Stable/Uncomplicated   pain is worsening per pt.   Clinical Decision Making Low    Rehab Potential Fair    PT Frequency 2x / week    PT Duration 4 weeks    PT Treatment/Interventions Therapeutic exercise;Therapeutic activities;Neuromuscular re-education;Patient/family education;Manual techniques;Dry needling;Spinal Manipulations;Joint Manipulations;Aquatic Therapy;Biofeedback;Electrical Stimulation;Iontophoresis 41m/ml Dexamethasone;Traction    PT Next Visit Plan posture, core, hip strength, femoral control, lumbopelvic control, manual techniques, modalities PRN    Consulted and Agree with Plan of Care Patient           Patient will benefit from skilled therapeutic intervention in order to improve the following deficits and impairments:  Pain,Postural dysfunction,Improper body mechanics,Decreased strength  Visit Diagnosis: Chronic bilateral low back pain, unspecified whether sciatica present  Muscle weakness (generalized)     Problem List Patient Active  Problem List   Diagnosis Date Noted  . Elevated blood pressure reading without diagnosis of hypertension 02/23/2021  . ADD (attention deficit disorder) 01/29/2021  . Positive ANA (antinuclear antibody) 05/08/2020  . Chronic neck pain 05/08/2020  . Bilateral hand pain 04/28/2020  . Arthralgia 04/28/2020  . Vulvar cyst 05/13/2019  . Metatarsalgia of right foot 08/07/2018  . Depression, major, single episode, moderate ([place] 04/20/2017  . GAD (generalized anxiety disorder) 04/20/2017  . Cervical disc disease 04/20/2017  . Abdominal pain, epigastric - episodic, ? gallstones 10/28/2015  . CIN III (cervical intraepithelial neoplasia III) 07/31/2014  . HSV-2 (herpes simplex virus 2) infection 05/14/2014     MJoneen BoersPT, DPT   04/07/2021, 6:27 PM  [place] [facility] [address] [place] [address]Phone: [phone]  Fax:  [phone] Name: ATYREA FROBERGMRN: [id number] of Birth: [birth year]

## 2021-04-12 ENCOUNTER — Encounter: Payer: Self-pay | Admitting: Family Medicine

## 2021-04-12 ENCOUNTER — Other Ambulatory Visit (INDEPENDENT_AMBULATORY_CARE_PROVIDER_SITE_OTHER): Payer: PRIVATE HEALTH INSURANCE

## 2021-04-12 ENCOUNTER — Telehealth (INDEPENDENT_AMBULATORY_CARE_PROVIDER_SITE_OTHER): Payer: PRIVATE HEALTH INSURANCE | Admitting: Family Medicine

## 2021-04-12 ENCOUNTER — Other Ambulatory Visit: Payer: Self-pay

## 2021-04-12 ENCOUNTER — Other Ambulatory Visit: Payer: Self-pay | Admitting: Family Medicine

## 2021-04-12 VITALS — BP 155/100 | HR 77 | Wt 140.0 lb

## 2021-04-12 DIAGNOSIS — Z20822 Contact with and (suspected) exposure to covid-19: Secondary | ICD-10-CM

## 2021-04-12 DIAGNOSIS — H029 Unspecified disorder of eyelid: Secondary | ICD-10-CM | POA: Insufficient documentation

## 2021-04-12 DIAGNOSIS — I10 Essential (primary) hypertension: Secondary | ICD-10-CM | POA: Insufficient documentation

## 2021-04-12 NOTE — Assessment & Plan Note (Signed)
Exam limited 2/2 to video. Discussed trial of OTC eye drops for possible allergy driven aspect and hydrocortisone cream for the lower lid for 2-3 days. She will try to send photos to better evaluate for possible eyelid infection and has pcp f/u in-person for re-evaluation.

## 2021-04-12 NOTE — Assessment & Plan Note (Signed)
Failed metoprolol due to side effects. Persistently elevated over last few months. Briefly discussed that other medications are available - she has f/u with pcp this week.

## 2021-04-12 NOTE — Patient Instructions (Signed)
Eye symptoms - Try to get mychart so you can upload pictures - Try over the counter allergy eye drops - Can also try topical hydrocortisone cream under the eyes - be careful not to get in they eye and only use for 2-3 days if no change  #Sore throat - covid testing - isolate until results  - can wear mask starting 04/14/2021 - Would recommend alternative allergy medication to see if that improves symptoms

## 2021-04-12 NOTE — Progress Notes (Signed)
I connected with Carly Suarez on 04/12/21 at 12:00 PM EDT by video and verified that I am speaking with the correct person using two identifiers.   I discussed the limitations, risks, security and privacy concerns of performing an evaluation and management service by video and the availability of in person appointments. I also discussed with the patient that there may be a patient responsible charge related to this service. The patient expressed understanding and agreed to proceed.  Patient location: Home Provider Location: [facility] Participants: Lynnda Child and ROBERTO HLAVATY   Subjective:     Carly Suarez is a 35 y.o. female presenting for Sore Throat (X 5 days ), Cough (dry), and Eye Problem (Skin under eyes itching, red and swollen )     HPI  #Sore throat - started 5/18 with itchy and scratchy and mildly sore in the morning - thought it was allergies was not getting worse  - no issues with eating/drinking - hoarse - was taking Claritin - last night everything got worse - this morning felt like strept throat - dry cough - 5/19  Skin under the eyes - red, itchy, sore, swollen - not sure if allergic reaction to make-up - took several photos - started 04/10/2021 - continues to take allergy medication, including benadryl w/o improvement  #HTN - notes she tried metoprolol but made her feel very lightheaded - has f/u this week with pcp to discuss - has been monitoring at home and consistently elevated.   Review of Systems  Constitutional: Negative for chills and fever.  HENT: Negative for congestion, ear pain, rhinorrhea, sinus pressure and sinus pain.   Respiratory: Positive for cough. Negative for shortness of breath.   Gastrointestinal: Negative for diarrhea, nausea and vomiting.  Musculoskeletal: Negative for arthralgias and myalgias.     Social History   Tobacco Use  Smoking Status Never Smoker  Smokeless Tobacco Never Used         Objective:   BP Readings from Last 3 Encounters:  04/12/21 (!) 155/100  02/23/21 (!) 160/96  01/29/21 (!) 144/96   Wt Readings from Last 3 Encounters:  04/12/21 140 lb (63.5 kg)  02/23/21 144 lb 8 oz (65.5 kg)  01/29/21 140 lb (63.5 kg)    BP (!) 155/100   Pulse 77   Wt 140 lb (63.5 kg)   LMP 03/24/2021 (Exact Date)   BMI 23.30 kg/m    Physical Exam Constitutional:      Appearance: Normal appearance. She is not ill-appearing.  HENT:     Head: Normocephalic and atraumatic.     Right Ear: External ear normal.     Left Ear: External ear normal.  Eyes:     Conjunctiva/sclera: Conjunctivae normal.  Pulmonary:     Effort: Pulmonary effort is normal. No respiratory distress.  Neurological:     Mental Status: She is alert. Mental status is at baseline.  Psychiatric:        Mood and Affect: Mood normal.        Behavior: Behavior normal.        Thought Content: Thought content normal.        Judgment: Judgment normal.             Assessment & Plan:   Problem List Items Addressed This Visit      Cardiovascular and Mediastinum   Essential hypertension    Failed metoprolol due to side effects. Persistently elevated over last few months. Briefly  discussed that other medications are available - she has f/u with pcp this week.         Other   Eyelid abnormality    Exam limited 2/2 to video. Discussed trial of OTC eye drops for possible allergy driven aspect and hydrocortisone cream for the lower lid for 2-3 days. She will try to send photos to better evaluate for possible eyelid infection and has pcp f/u in-person for re-evaluation.        Other Visit Diagnoses    Suspected COVID-19 virus infection    -  Primary      Discussed OTC treatment for viral illness Patient will come today for drive-up testing Instructed to isolate until the results come back  Discussed trying alternative allergy medication while awaiting results for possible allergy driven symptoms.     Return if symptoms worsen or fail to improve.  Lynnda Child, MD

## 2021-04-13 ENCOUNTER — Ambulatory Visit: Payer: PRIVATE HEALTH INSURANCE

## 2021-04-13 LAB — NOVEL CORONAVIRUS, NAA: SARS-CoV-2, NAA: NOT DETECTED

## 2021-04-13 LAB — SARS-COV-2, NAA 2 DAY TAT

## 2021-04-14 ENCOUNTER — Encounter: Payer: Self-pay | Admitting: Family Medicine

## 2021-04-14 ENCOUNTER — Telehealth: Payer: Self-pay | Admitting: Family Medicine

## 2021-04-14 ENCOUNTER — Other Ambulatory Visit: Payer: Self-pay

## 2021-04-14 NOTE — Telephone Encounter (Addendum)
Patient called in asking if we had any appts available today 04/14/21. I explained to the patient that unfortunately we did not have any appts available. I asked the patient if there was something going on (being the notes for appt tom 04/15/21 is for BP concerns) I was going to get her to access nurse if there was an issue that may have arised from the BP concern. She said "why are you asking?" I told her why and she proceeded to tell me that she believed she had a sinus infection and wanted an antibiotic. I proceeded to ask her what symptoms she had and she said "why are you asking?" I said to her that If she was having any current symptoms like runny nose, sore throat, coughing we would need to have her have a virtual visit due to her symptoms. She told me that " We needed to get our stuff together that she was told she could come in." I explained to her that due to our current office protocol we couldn't have her in the office with her symptoms. I apologized to her that she was told she to come in. She then continued to say "Y'all need to get your acts together" and hung up on me.Patient was very upset and yelling while on the call with me. I made the office manager Gillis Santa aware of what happened and what she was told so that she could reach out to the patient to discuss her concerns.[name] was going to reach out to her to discuss.

## 2021-04-14 NOTE — Telephone Encounter (Signed)
Error

## 2021-04-14 NOTE — Telephone Encounter (Signed)
[name] [employer]) brought it to my attention that when she was confirming appointments, she had called this patient not knowing the conversation Carly Suarez had just had with the patient. She stated the patient was cussing at her and used the "F" word multiple times. [name] advised the patient that she would have the supervisor call her. I called patient back and when she answered, I told her my name and she stated "I do not want to talk to [facility]". I explained to her that I am the [employer], not the [place] she previously spoke with, and I was calling to apologize for the miscommunication and to get her taken care of. I asked her to explain to me what had happened. She proceeded to state that "Everyone is obsessed with Covid still" and that everyone needs to "Forget about covid". She stated she had a virtual visit on Monday and had to get tested for Covid. Her test came back negative, so she sent a message to Dr. [name] with an update. When she received the response from Dr. [name] advising her to have her ears checked in the office, she attempted to call the office to make sure she could come in. That is when she had the conversation with Carly Suarez. She then proceeded to cuss at me and when there was a pause, I said "Ma'am, I am going to have to ask you to stop cursing". Patient hung up. She then called back and Denishia parked her so I could speak with her on whether she can come into the office or not. When I answered the phone, she stated she did not want to speak with either [name]. I told her that I apologize, but no one else was available at the moment and that I, the front office supervisor, want to try to help her. She stated she was just calling to find out if her appointment tomorrow was cancelled or not. I advised her that as of this moment, the appointment has not been cancelled and that I will check with Dr. [name] to see if it is appropriate for her to come into the office. She then  asked if I remembered the days when Doctors offices actually took care of her patients. I explained to her that we are still doing everything we can to take care of our patients, but that with Covid still going on, we do have to take extra precautions to ensure we are protecting everyone. She did not have anymore to say. I asked if there was anything else I could help her with while she was on the phone. She stated "no". I advised her that I would reach out to her tomorrow to let her know whether she can come into the office for her appointment or not. Patient verbalized understanding.

## 2021-04-15 ENCOUNTER — Ambulatory Visit (INDEPENDENT_AMBULATORY_CARE_PROVIDER_SITE_OTHER): Payer: PRIVATE HEALTH INSURANCE | Admitting: Family Medicine

## 2021-04-15 ENCOUNTER — Ambulatory Visit: Payer: PRIVATE HEALTH INSURANCE

## 2021-04-15 VITALS — BP 150/98 | HR 85 | Ht 65.0 in | Wt 144.0 lb

## 2021-04-15 DIAGNOSIS — H66001 Acute suppurative otitis media without spontaneous rupture of ear drum, right ear: Secondary | ICD-10-CM | POA: Diagnosis not present

## 2021-04-15 DIAGNOSIS — I10 Essential (primary) hypertension: Secondary | ICD-10-CM

## 2021-04-15 MED ORDER — LISINOPRIL-HYDROCHLOROTHIAZIDE 10-12.5 MG PO TABS: 1.0000 | ORAL_TABLET | Freq: Every day | ORAL | 3 refills | Status: DC

## 2021-04-15 MED ORDER — AMOXICILLIN 500 MG PO CAPS: 1000.0000 mg | ORAL_CAPSULE | Freq: Two times a day (BID) | ORAL | 0 refills | Status: DC

## 2021-04-15 MED ORDER — LOSARTAN POTASSIUM-HCTZ 50-12.5 MG PO TABS: 1.0000 | ORAL_TABLET | Freq: Every day | ORAL | 5 refills | Status: DC

## 2021-04-15 NOTE — Progress Notes (Signed)
Patient ID: Carly Suarez, female    DOB: 10-14-1986, 35 y.o.   MRN: [id number]  This visit was conducted in person.  BP (!) 150/98   Pulse 85   Ht 5\' 5"  (1.651 m)   Wt 144 lb (65.3 kg)   LMP 03/24/2021 (Exact Date)   SpO2 99%   BMI 23.96 kg/m    CC:  Chief Complaint  Patient presents with   Hypertension    150-170's over 90-100's x 1 month    Ear Fullness    From recent sinus infection     Subjective:   HPI: Carly Suarez is a 35 y.o. female presenting on 04/15/2021 for Hypertension (150-170's over 90-100's x 1 month ) and Ear Fullness (From recent sinus infection )   She was treated for viral infection (ST, hoarse, no fever, no aches, clear discharge) ( Neg COVID test)  in but continues to have  Right ear fullness  And pain5/23/2022.  Sinus pressure She has tried phenylepherine sinus med and allegra.   Hypertension: Has SE to metoprolol... felt dizzy. At rest, randomly. Not always associated with anxiety or stressful situations.  No palpitations. No chest pain.   Mother with HTN.  No  caffeine BP Readings from Last 3 Encounters:  04/15/21 (!) 150/98  04/12/21 (!) 155/100  02/23/21 (!) 160/96  Using medication without problems or lightheadedness:  Chest pain with exertion: none Edema:none Short of breath: none  Average home BPs: Other issues:  TSH CMET, cbc normal 01/11/2021   GAD.MDD: on cymbalta  90 mg daily  Relevant past medical, surgical, family and social history reviewed and updated as indicated. Interim medical history since our last visit reviewed. Allergies and medications reviewed and updated. Outpatient Medications Prior to Visit  Medication Sig Dispense Refill   DULoxetine (CYMBALTA) 30 MG capsule Take 3 capsules (90 mg total) by mouth daily. 90 capsule 3   Norgestimate-Ethinyl Estradiol Triphasic (TRI-SPRINTEC) 0.18/0.215/0.25 MG-35 MCG tablet Take 1 tablet by mouth daily. 3 Package 4   valACYclovir (VALTREX) 1000 MG tablet Take 1,000 mg by mouth  daily as needed.     metoprolol tartrate (LOPRESSOR) 25 MG tablet Take 1 tablet (25 mg total) by mouth daily as needed (BP > 160/100). (Patient not taking: Reported on 04/15/2021) 20 tablet 0   No facility-administered medications prior to visit.     Per HPI unless specifically indicated in ROS section below Review of Systems  Constitutional:  Negative for fatigue and fever.  HENT:  Negative for ear pain.   Eyes:  Negative for pain.  Respiratory:  Negative for chest tightness and shortness of breath.   Cardiovascular:  Negative for chest pain, palpitations and leg swelling.  Gastrointestinal:  Negative for abdominal pain.  Genitourinary:  Negative for dysuria.  Objective:  BP (!) 150/98   Pulse 85   Ht 5\' 5"  (1.651 m)   Wt 144 lb (65.3 kg)   LMP 03/24/2021 (Exact Date)   SpO2 99%   BMI 23.96 kg/m   Wt Readings from Last 3 Encounters:  04/15/21 144 lb (65.3 kg)  04/12/21 140 lb (63.5 kg)  02/23/21 144 lb 8 oz (65.5 kg)      Physical Exam Constitutional:      General: She is not in acute distress.    Appearance: Normal appearance. She is well-developed. She is not ill-appearing or toxic-appearing.  HENT:     Head: Normocephalic.     Right Ear: Hearing, ear canal and external ear normal.  A middle ear effusion is present. Tympanic membrane is injected. Tympanic membrane is not erythematous, retracted or bulging.     Left Ear: Hearing, tympanic membrane, ear canal and external ear normal. Tympanic membrane is not erythematous, retracted or bulging.     Nose: No mucosal edema or rhinorrhea.     Right Sinus: No maxillary sinus tenderness or frontal sinus tenderness.     Left Sinus: No maxillary sinus tenderness or frontal sinus tenderness.     Mouth/Throat:     Pharynx: Uvula midline.  Eyes:     General: Lids are normal. Lids are everted, no foreign bodies appreciated.     Conjunctiva/sclera: Conjunctivae normal.     Pupils: Pupils are equal, round, and reactive to light.   Neck:     Thyroid: No thyroid mass or thyromegaly.     Vascular: No carotid bruit.     Trachea: Trachea normal.  Cardiovascular:     Rate and Rhythm: Normal rate and regular rhythm.     Pulses: Normal pulses.     Heart sounds: Normal heart sounds, S1 normal and S2 normal. No murmur heard.   No friction rub. No gallop.  Pulmonary:     Effort: Pulmonary effort is normal. No tachypnea or respiratory distress.     Breath sounds: Normal breath sounds. No decreased breath sounds, wheezing, rhonchi or rales.  Abdominal:     General: Bowel sounds are normal.     Palpations: Abdomen is soft.     Tenderness: There is no abdominal tenderness.  Musculoskeletal:     Cervical back: Normal range of motion and neck supple.  Skin:    General: Skin is warm and dry.     Findings: No rash.  Neurological:     Mental Status: She is alert.  Psychiatric:        Mood and Affect: Mood is not anxious or depressed.        Speech: Speech normal.        Behavior: Behavior normal. Behavior is cooperative.        Thought Content: Thought content normal.        Judgment: Judgment normal.      Results for orders placed or performed in visit on 04/12/21  Novel Coronavirus, NAA ([facility])   Specimen: Nasopharyngeal(NP) swabs in vial transport medium   Nasopharynge  Result Value Ref Range   SARS-CoV-2, NAA Not Detected Not Detected  SARS-COV-2, NAA 2 DAY TAT   Nasopharynge  Result Value Ref Range   SARS-CoV-2, NAA 2 DAY TAT Performed     This visit occurred during the SARS-CoV-2 public health emergency.  Safety protocols were in place, including screening questions prior to the visit, additional usage of staff PPE, and extensive cleaning of exam room while observing appropriate contact time as indicated for disinfecting solutions.   COVID 19 screen:  No recent travel or known exposure to COVID19 The patient denies respiratory symptoms of COVID 19 at this time. The importance of social distancing was  discussed today.   Assessment and Plan Problem List Items Addressed This Visit     Essential hypertension - Primary    Inadequate control, chronic  Start losartan/HCTZ daily. Follow BPs at home. Goal < 140/90 Avoid decongestants.       Relevant Medications   losartan-hydrochlorothiazide (HYZAAR) 50-12.5 MG tablet   Other Relevant Orders   EKG 12-Lead (Completed)   Non-recurrent acute suppurative otitis media of right ear without spontaneous rupture of tympanic membrane  Treat with antibiotics  And start nasal steroid.       Relevant Medications   amoxicillin (AMOXIL) 500 MG capsule   Meds ordered this encounter  Medications   DISCONTD: lisinopril-hydrochlorothiazide (ZESTORETIC) 10-12.5 MG tablet    Sig: Take 1 tablet by mouth daily.    Dispense:  30 tablet    Refill:  3   amoxicillin (AMOXIL) 500 MG capsule    Sig: Take 2 capsules (1,000 mg total) by mouth 2 (two) times daily.    Dispense:  40 capsule    Refill:  0   losartan-hydrochlorothiazide (HYZAAR) 50-12.5 MG tablet    Sig: Take 1 tablet by mouth daily.    Dispense:  30 tablet    Refill:  5       Kerby Nora, MD

## 2021-04-15 NOTE — Patient Instructions (Addendum)
Start losartan/HCTZ daily. Follow BPs at home. Goal < 140/90 Avoid decongestants.  Start antibiotics and flonase 2 sprays per nostril daily.   Hypertension, Adult High blood pressure (hypertension) is when the force of blood pumping through the arteries is too strong. The arteries are the blood vessels that carry blood from the heart throughout the body. Hypertension forces the heart to work harder to pump blood and may cause arteries to become narrow or stiff. Untreated or uncontrolled hypertension can cause a heart attack, heart failure, a stroke, kidney disease, and other problems. A blood pressure reading consists of a higher number over a lower number. Ideally, your blood pressure should be below 120/80. The first ("top") number is called the systolic pressure. It is a measure of the pressure in your arteries as your heart beats. The second ("bottom") number is called the diastolic pressure. It is a measure of the pressure in your arteries as the heart relaxes. What are the causes? The exact cause of this condition is not known. There are some conditions that result in or are related to high blood pressure. What increases the risk? Some risk factors for high blood pressure are under your control. The following factors may make you more likely to develop this condition:  Smoking.  Having type 2 diabetes mellitus, high cholesterol, or both.  Not getting enough exercise or physical activity.  Being overweight.  Having too much fat, sugar, calories, or salt (sodium) in your diet.  Drinking too much alcohol. Some risk factors for high blood pressure may be difficult or impossible to change. Some of these factors include:  Having chronic kidney disease.  Having a family history of high blood pressure.  Age. Risk increases with age.  Race. You may be at higher risk if you are African American.  Gender. Men are at higher risk than women before age 68. After age 62, women are at higher  risk than men.  Having obstructive sleep apnea.  Stress. What are the signs or symptoms? High blood pressure may not cause symptoms. Very high blood pressure (hypertensive crisis) may cause:  Headache.  Anxiety.  Shortness of breath.  Nosebleed.  Nausea and vomiting.  Vision changes.  Severe chest pain.  Seizures. How is this diagnosed? This condition is diagnosed by measuring your blood pressure while you are seated, with your arm resting on a flat surface, your legs uncrossed, and your feet flat on the floor. The cuff of the blood pressure monitor will be placed directly against the skin of your upper arm at the level of your heart. It should be measured at least twice using the same arm. Certain conditions can cause a difference in blood pressure between your right and left arms. Certain factors can cause blood pressure readings to be lower or higher than normal for a short period of time:  When your blood pressure is higher when you are in a health care provider's office than when you are at home, this is called white coat hypertension. Most people with this condition do not need medicines.  When your blood pressure is higher at home than when you are in a health care provider's office, this is called masked hypertension. Most people with this condition may need medicines to control blood pressure. If you have a high blood pressure reading during one visit or you have normal blood pressure with other risk factors, you may be asked to:  Return on a different day to have your blood pressure checked again.  Monitor your blood pressure at home for 1 week or longer. If you are diagnosed with hypertension, you may have other blood or imaging tests to help your health care provider understand your overall risk for other conditions. How is this treated? This condition is treated by making healthy lifestyle changes, such as eating healthy foods, exercising more, and reducing your  alcohol intake. Your health care provider may prescribe medicine if lifestyle changes are not enough to get your blood pressure under control, and if:  Your systolic blood pressure is above 130.  Your diastolic blood pressure is above 80. Your personal target blood pressure may vary depending on your medical conditions, your age, and other factors. Follow these instructions at home: Eating and drinking  Eat a diet that is high in fiber and potassium, and low in sodium, added sugar, and fat. An example eating plan is called the DASH (Dietary Approaches to Stop Hypertension) diet. To eat this way: ? Eat plenty of fresh fruits and vegetables. Try to fill one half of your plate at each meal with fruits and vegetables. ? Eat whole grains, such as whole-wheat pasta, brown rice, or whole-grain bread. Fill about one fourth of your plate with whole grains. ? Eat or drink low-fat dairy products, such as skim milk or low-fat yogurt. ? Avoid fatty cuts of meat, processed or cured meats, and poultry with skin. Fill about one fourth of your plate with lean proteins, such as fish, chicken without skin, beans, eggs, or tofu. ? Avoid pre-made and processed foods. These tend to be higher in sodium, added sugar, and fat.  Reduce your daily sodium intake. Most people with hypertension should eat less than 1,500 mg of sodium a day.  Do not drink alcohol if: ? Your health care provider tells you not to drink. ? You are pregnant, may be pregnant, or are planning to become pregnant.  If you drink alcohol: ? Limit how much you use to:  0-1 drink a day for women.  0-2 drinks a day for men. ? Be aware of how much alcohol is in your drink. In the [place] one drink equals one 12 oz bottle of beer (355 mL), one 5 oz glass of wine (148 mL), or one 1 oz glass of hard liquor (44 mL).   Lifestyle  Work with your health care provider to maintain a healthy body weight or to lose weight. Ask what an ideal weight is for  you.  Get at least 30 minutes of exercise most days of the week. Activities may include walking, swimming, or biking.  Include exercise to strengthen your muscles (resistance exercise), such as Pilates or lifting weights, as part of your weekly exercise routine. Try to do these types of exercises for 30 minutes at least 3 days a week.  Do not use any products that contain nicotine or tobacco, such as cigarettes, e-cigarettes, and chewing tobacco. If you need help quitting, ask your health care provider.  Monitor your blood pressure at home as told by your health care provider.  Keep all follow-up visits as told by your health care provider. This is important.   Medicines  Take over-the-counter and prescription medicines only as told by your health care provider. Follow directions carefully. Blood pressure medicines must be taken as prescribed.  Do not skip doses of blood pressure medicine. Doing this puts you at risk for problems and can make the medicine less effective.  Ask your health care provider about side effects or reactions  to medicines that you should watch for. Contact a health care provider if you:  Think you are having a reaction to a medicine you are taking.  Have headaches that keep coming back (recurring).  Feel dizzy.  Have swelling in your ankles.  Have trouble with your vision. Get help right away if you:  Develop a severe headache or confusion.  Have unusual weakness or numbness.  Feel faint.  Have severe pain in your chest or abdomen.  Vomit repeatedly.  Have trouble breathing. Summary  Hypertension is when the force of blood pumping through your arteries is too strong. If this condition is not controlled, it may put you at risk for serious complications.  Your personal target blood pressure may vary depending on your medical conditions, your age, and other factors. For most people, a normal blood pressure is less than 120/80.  Hypertension is  treated with lifestyle changes, medicines, or a combination of both. Lifestyle changes include losing weight, eating a healthy, low-sodium diet, exercising more, and limiting alcohol. This information is not intended to replace advice given to you by your health care provider. Make sure you discuss any questions you have with your health care provider. Document Revised: 07/18/2018 Document Reviewed: 07/18/2018 Elsevier Patient Education  2021 [employer].

## 2021-04-20 ENCOUNTER — Other Ambulatory Visit: Payer: Self-pay | Admitting: Obstetrics & Gynecology

## 2021-04-22 ENCOUNTER — Other Ambulatory Visit: Payer: Self-pay | Admitting: Obstetrics & Gynecology

## 2021-04-23 ENCOUNTER — Other Ambulatory Visit (HOSPITAL_BASED_OUTPATIENT_CLINIC_OR_DEPARTMENT_OTHER): Payer: Self-pay | Admitting: *Deleted

## 2021-04-23 MED ORDER — NORGESTIM-ETH ESTRAD TRIPHASIC 0.18/0.215/0.25 MG-35 MCG PO TABS: 1.0000 | ORAL_TABLET | Freq: Every day | ORAL | 0 refills | Status: DC

## 2021-04-23 NOTE — Telephone Encounter (Signed)
LMOVM for pt to return my call.  

## 2021-04-23 NOTE — Telephone Encounter (Signed)
Please call pt.  Received refill request for OCP.  I do not know if she is planning on coming here as this is a longer distance from her home.  Saw PCP this week but I cannot see final not so do not know what was done at that visit.  Will refill after appt is made if pt going to continue care with me.  Thank you.

## 2021-05-13 ENCOUNTER — Ambulatory Visit: Payer: PRIVATE HEALTH INSURANCE

## 2021-05-15 ENCOUNTER — Other Ambulatory Visit (HOSPITAL_BASED_OUTPATIENT_CLINIC_OR_DEPARTMENT_OTHER): Payer: Self-pay | Admitting: Obstetrics & Gynecology

## 2021-05-17 NOTE — Telephone Encounter (Signed)
Pt was called when last refill request came and was completed.  Due to my move and being a longer distance from her, she stated she planned to see someone closer to her home.  Current refill request denied.

## 2021-05-31 DIAGNOSIS — M9902 Segmental and somatic dysfunction of thoracic region: Secondary | ICD-10-CM | POA: Diagnosis not present

## 2021-05-31 DIAGNOSIS — M9901 Segmental and somatic dysfunction of cervical region: Secondary | ICD-10-CM | POA: Diagnosis not present

## 2021-05-31 DIAGNOSIS — M62830 Muscle spasm of back: Secondary | ICD-10-CM | POA: Diagnosis not present

## 2021-05-31 DIAGNOSIS — M9903 Segmental and somatic dysfunction of lumbar region: Secondary | ICD-10-CM | POA: Diagnosis not present

## 2021-06-03 DIAGNOSIS — H66001 Acute suppurative otitis media without spontaneous rupture of ear drum, right ear: Secondary | ICD-10-CM | POA: Insufficient documentation

## 2021-06-03 NOTE — Assessment & Plan Note (Signed)
Treat with antibiotics  And start nasal steroid.

## 2021-06-03 NOTE — Assessment & Plan Note (Signed)
Inadequate control, chronic  Start losartan/HCTZ daily. Follow BPs at home. Goal < 140/90 Avoid decongestants.

## 2021-06-14 DIAGNOSIS — M9902 Segmental and somatic dysfunction of thoracic region: Secondary | ICD-10-CM | POA: Diagnosis not present

## 2021-06-14 DIAGNOSIS — M9903 Segmental and somatic dysfunction of lumbar region: Secondary | ICD-10-CM | POA: Diagnosis not present

## 2021-06-14 DIAGNOSIS — M9901 Segmental and somatic dysfunction of cervical region: Secondary | ICD-10-CM | POA: Diagnosis not present

## 2021-06-14 DIAGNOSIS — M62830 Muscle spasm of back: Secondary | ICD-10-CM | POA: Diagnosis not present

## 2021-06-22 DIAGNOSIS — L249 Irritant contact dermatitis, unspecified cause: Secondary | ICD-10-CM | POA: Diagnosis not present

## 2021-07-06 DIAGNOSIS — M9902 Segmental and somatic dysfunction of thoracic region: Secondary | ICD-10-CM | POA: Diagnosis not present

## 2021-07-06 DIAGNOSIS — M62830 Muscle spasm of back: Secondary | ICD-10-CM | POA: Diagnosis not present

## 2021-07-06 DIAGNOSIS — M9903 Segmental and somatic dysfunction of lumbar region: Secondary | ICD-10-CM | POA: Diagnosis not present

## 2021-07-06 DIAGNOSIS — M9901 Segmental and somatic dysfunction of cervical region: Secondary | ICD-10-CM | POA: Diagnosis not present

## 2021-07-16 DIAGNOSIS — Z124 Encounter for screening for malignant neoplasm of cervix: Secondary | ICD-10-CM | POA: Diagnosis not present

## 2021-07-16 DIAGNOSIS — Z1151 Encounter for screening for human papillomavirus (HPV): Secondary | ICD-10-CM | POA: Diagnosis not present

## 2021-07-27 DIAGNOSIS — M9901 Segmental and somatic dysfunction of cervical region: Secondary | ICD-10-CM | POA: Diagnosis not present

## 2021-07-27 DIAGNOSIS — M9903 Segmental and somatic dysfunction of lumbar region: Secondary | ICD-10-CM | POA: Diagnosis not present

## 2021-07-27 DIAGNOSIS — M62830 Muscle spasm of back: Secondary | ICD-10-CM | POA: Diagnosis not present

## 2021-07-27 DIAGNOSIS — M9902 Segmental and somatic dysfunction of thoracic region: Secondary | ICD-10-CM | POA: Diagnosis not present

## 2021-08-17 DIAGNOSIS — M9903 Segmental and somatic dysfunction of lumbar region: Secondary | ICD-10-CM | POA: Diagnosis not present

## 2021-08-17 DIAGNOSIS — M9901 Segmental and somatic dysfunction of cervical region: Secondary | ICD-10-CM | POA: Diagnosis not present

## 2021-08-17 DIAGNOSIS — M62830 Muscle spasm of back: Secondary | ICD-10-CM | POA: Diagnosis not present

## 2021-08-17 DIAGNOSIS — M9902 Segmental and somatic dysfunction of thoracic region: Secondary | ICD-10-CM | POA: Diagnosis not present

## 2021-08-31 DIAGNOSIS — M9902 Segmental and somatic dysfunction of thoracic region: Secondary | ICD-10-CM | POA: Diagnosis not present

## 2021-08-31 DIAGNOSIS — M9903 Segmental and somatic dysfunction of lumbar region: Secondary | ICD-10-CM | POA: Diagnosis not present

## 2021-08-31 DIAGNOSIS — M9901 Segmental and somatic dysfunction of cervical region: Secondary | ICD-10-CM | POA: Diagnosis not present

## 2021-08-31 DIAGNOSIS — M62830 Muscle spasm of back: Secondary | ICD-10-CM | POA: Diagnosis not present

## 2021-09-13 ENCOUNTER — Other Ambulatory Visit: Payer: Self-pay

## 2021-09-13 ENCOUNTER — Ambulatory Visit
Admission: EM | Admit: 2021-09-13 | Discharge: 2021-09-13 | Disposition: A | Discharge status: Home / Self Care | Payer: BC Managed Care – PPO | Attending: Emergency Medicine | Admitting: Emergency Medicine

## 2021-09-13 ENCOUNTER — Encounter: Payer: Self-pay | Admitting: Emergency Medicine

## 2021-09-13 DIAGNOSIS — R051 Acute cough: Secondary | ICD-10-CM

## 2021-09-13 MED ORDER — BENZONATATE 100 MG PO CAPS: 100.0000 mg | ORAL_CAPSULE | Freq: Three times a day (TID) | ORAL | 0 refills | Status: DC

## 2021-09-13 NOTE — ED Provider Notes (Signed)
[facility]  ____________________________________________  Time seen: Approximately 11:33 AM  I have reviewed the triage vital signs and the nursing notes.   HISTORY  Chief Complaint URI   Historian Patient     HPI Carly Suarez is a 35 y.o. female presents to the urgent care with nasal congestion, ear pain, rhinorrhea and cough.  Patient reports that cough started today and nasal congestion and other viral URI-like symptoms have persisted for 3 days.  She has been afebrile at home.  No chest pain, chest tightness or abdominal pain.   Past Medical History:  Diagnosis Date   ADD (attention deficit disorder)    History of chicken pox    History of shingles      Immunizations up to date:  Yes.     Past Medical History:  Diagnosis Date   ADD (attention deficit disorder)    History of chicken pox    History of shingles     Patient Active Problem List   Diagnosis Date Noted   Non-recurrent acute suppurative otitis media of right ear without spontaneous rupture of tympanic membrane 06/03/2021   Essential hypertension 04/12/2021   Eyelid abnormality 04/12/2021   Elevated blood pressure reading without diagnosis of hypertension 02/23/2021   ADD (attention deficit disorder) 01/29/2021   Positive ANA (antinuclear antibody) 05/08/2020   Chronic neck pain 05/08/2020   Bilateral hand pain 04/28/2020   Arthralgia 04/28/2020   Vulvar cyst 05/13/2019   Metatarsalgia of right foot 08/07/2018   Depression, major, single episode, moderate (HCC) 04/20/2017   GAD (generalized anxiety disorder) 04/20/2017   Cervical disc disease 04/20/2017   Abdominal pain, epigastric - episodic, ? 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 Authorizing Provider  benzonatate (TESSALON) 100 MG capsule Take 1 capsule (100 mg total) by mouth every 8 (eight) hours. 09/13/21  Yes Pia Mau M, PA-C  Norgestimate-Ethinyl Estradiol Triphasic (TRI-ESTARYLLA) 0.18/0.215/0.25 MG-35 MCG tablet Take 1 tablet by mouth daily. 04/23/21  Yes Jerene Bears, MD  amoxicillin (AMOXIL) 500 MG capsule Take 2 capsules (1,000 mg total) by mouth 2 (two) times daily. 04/15/21   Bedsole, Amy E, MD  DULoxetine (CYMBALTA) 30 MG capsule Take 3 capsules (90 mg total) by mouth daily. 02/23/21   Bedsole, Amy E, MD  losartan-hydrochlorothiazide (HYZAAR) 50-12.5 MG tablet Take 1 tablet by mouth daily. 04/15/21   Bedsole, Amy E, MD  metoprolol tartrate (LOPRESSOR) 25 MG tablet Take 1 tablet (25 mg total) by mouth daily as needed (BP > 160/100). Patient not taking: Reported on 04/15/2021 02/23/21   Excell Seltzer, MD  valACYclovir (VALTREX) 1000 MG tablet Take 1,000 mg by mouth daily as needed.    [provider]    Allergies Patient has no known allergies.  Family History  Problem Relation Age of Onset   Hypertension Mother    Hypertension Father    Prostate cancer Father 79   Melanoma Father 58   Diabetes Maternal Grandmother    Arthritis Maternal Grandmother    Hypertension Maternal Grandmother    Prostate cancer Maternal  Grandfather    Heart disease Paternal Grandmother    Other Brother 11       Batten Disease - gentic disorder  - attacks brain and nervous system     Social History Social History   Tobacco Use   Smoking status: Never   Smokeless tobacco: Never  Vaping Use   Vaping Use: Never used  Substance Use Topics   Alcohol use: Yes    Comment: occasional   Drug use: No     Review of Systems  Constitutional: No fever/chills Eyes:  No  discharge ENT: Patient has nasal congestion.  Respiratory: Patient has cough.  Gastrointestinal:   No nausea, no vomiting.  No diarrhea.  No constipation. Musculoskeletal: Negative for musculoskeletal pain. Skin: Negative for rash, abrasions, lacerations, ecchymosis.    ____________________________________________   PHYSICAL EXAM:  VITAL SIGNS: ED Triage Vitals  Enc Vitals Group     BP 09/13/21 1111 132/83     Pulse Rate 09/13/21 1111 85     Resp --      Temp 09/13/21 1111 98.4 F (36.9 C)     Temp Source 09/13/21 1111 Oral     SpO2 09/13/21 1111 96 %     Weight --      Height --      Head Circumference --      Peak Flow --      Pain Score 09/13/21 1109 0     Pain Loc --      Pain Edu? --      Excl. in GC? --      Constitutional: Alert and oriented. Patient is lying supine. Eyes: Conjunctivae are normal. PERRL. EOMI. Head: Atraumatic. ENT:      Ears: Tympanic membranes are mildly injected with mild effusion bilaterally.       Nose: No congestion/rhinnorhea.      Mouth/Throat: Mucous membranes are moist. Posterior pharynx is mildly erythematous.  Hematological/Lymphatic/Immunilogical: No cervical lymphadenopathy.  Cardiovascular: Normal rate, regular rhythm. Normal S1 and S2.  Good peripheral circulation. Respiratory: Normal respiratory effort without tachypnea or retractions. Lungs CTAB. Good air entry to the bases with no decreased or absent breath sounds. Gastrointestinal: Bowel sounds 4 quadrants. Soft and nontender to palpation. No guarding or rigidity. No palpable masses. No distention. No CVA tenderness. Musculoskeletal: Full range of motion to all extremities. No gross deformities appreciated. Neurologic:  Normal speech and language. No gross focal neurologic deficits are appreciated.  Skin:  Skin is warm, dry and intact. No rash noted. Psychiatric: Mood and affect are normal. Speech and behavior are normal. Patient exhibits appropriate insight and  judgement.   ____________________________________________   LABS (all labs ordered are listed, but only abnormal results are displayed)  Labs Reviewed  COVID-19, FLU A+B NAA   ____________________________________________  EKG   ____________________________________________  RADIOLOGY   No results found.  ____________________________________________    PROCEDURES  Procedure(s) performed:     Procedures     Medications - No data to display   ____________________________________________   INITIAL IMPRESSION / ASSESSMENT AND PLAN / ED COURSE  Pertinent labs & imaging results that were available during my care of the patient were reviewed by me and considered in my medical decision making (see chart for details).    Assessment and plan Cough 35 year old female presents to the urgent care with viral URI-like symptoms.  COVID-19 and influenza testing results are in process at this time.  Recommended Zyrtec, Flonase, Tessalon Perles and Mucinex DM.  Return precautions were given  to return with new or worsening symptoms.  A work note was provided.      ____________________________________________  FINAL CLINICAL IMPRESSION(S) / ED DIAGNOSES  Final diagnoses:  Acute cough      NEW MEDICATIONS STARTED DURING THIS VISIT:  ED Discharge Orders          Ordered    benzonatate (TESSALON) 100 MG capsule  Every 8 hours        09/13/21 1128                This chart was dictated using voice recognition software/Dragon. Despite best efforts to proofread, errors can occur which can change the meaning. Any change was purely unintentional.     Orvil Feil, PA-C 09/13/21 1135

## 2021-09-13 NOTE — Discharge Instructions (Signed)
You can take 2 Tessalon Perles at night before bed. Take Tessalon Perles with 1 Mucinex DM 12-hour. Your COVID-19 and influenza testing results will post to MyChart.

## 2021-09-13 NOTE — ED Triage Notes (Signed)
Pt c/o stuffiness, cough, chest congestion, sneezing, and right ear feels clogged x 5 days.

## 2021-09-14 LAB — COVID-19, FLU A+B NAA
Influenza A, NAA: NOT DETECTED
Influenza B, NAA: NOT DETECTED
SARS-CoV-2, NAA: NOT DETECTED

## 2021-10-12 ENCOUNTER — Other Ambulatory Visit: Payer: Self-pay | Admitting: Family Medicine

## 2021-10-28 ENCOUNTER — Telehealth: Payer: Self-pay | Admitting: Family Medicine

## 2021-10-28 DIAGNOSIS — I10 Essential (primary) hypertension: Secondary | ICD-10-CM

## 2021-10-28 NOTE — Telephone Encounter (Signed)
-----   Message from Terri J Walsh sent at 10/11/2021 11:10 AM EST ----- Regarding: Lab orders for  Friday, 12.9.22 Patient is scheduled for CPX labs, please order future labs, Thanks , Terri   

## 2021-10-29 ENCOUNTER — Other Ambulatory Visit: Payer: PRIVATE HEALTH INSURANCE

## 2021-11-01 ENCOUNTER — Other Ambulatory Visit: Payer: PRIVATE HEALTH INSURANCE

## 2021-11-05 ENCOUNTER — Encounter: Payer: PRIVATE HEALTH INSURANCE | Admitting: Family Medicine

## 2021-12-10 ENCOUNTER — Ambulatory Visit
Admission: EM | Admit: 2021-12-10 | Discharge: 2021-12-10 | Disposition: A | Discharge status: Home / Self Care | Payer: BC Managed Care – PPO | Attending: Internal Medicine | Admitting: Internal Medicine

## 2021-12-10 ENCOUNTER — Encounter: Payer: Self-pay | Admitting: Emergency Medicine

## 2021-12-10 ENCOUNTER — Other Ambulatory Visit: Payer: Self-pay

## 2021-12-10 DIAGNOSIS — R509 Fever, unspecified: Secondary | ICD-10-CM | POA: Diagnosis not present

## 2021-12-10 DIAGNOSIS — B349 Viral infection, unspecified: Secondary | ICD-10-CM

## 2021-12-10 DIAGNOSIS — R52 Pain, unspecified: Secondary | ICD-10-CM | POA: Diagnosis not present

## 2021-12-10 DIAGNOSIS — Z1152 Encounter for screening for COVID-19: Secondary | ICD-10-CM | POA: Diagnosis not present

## 2021-12-10 LAB — POCT INFLUENZA A/B
Influenza A, POC: NEGATIVE
Influenza B, POC: NEGATIVE

## 2021-12-10 MED ORDER — IBUPROFEN 600 MG PO TABS
600.0000 mg | ORAL_TABLET | ORAL | Status: AC
  Administered 2021-12-10: 600 mg via ORAL

## 2021-12-10 NOTE — Discharge Instructions (Addendum)
Your COVID 19/FLU//RSV results should result within  days. Negative results are immediately resulted to Mychart. Positive results will receive a follow-up call from our clinic. If symptoms are present, I recommend home quarantine until results are known.  Alternate Tylenol and ibuprofen as needed for body aches and fever.  Symptom management per recommendations discussed today.  If any breathing difficulty or chest pain develops go immediately to the closest emergency department for evaluation.

## 2021-12-10 NOTE — ED Triage Notes (Signed)
Pt here with sudden onset of body aches, headache and fever last night.

## 2021-12-10 NOTE — ED Provider Notes (Signed)
[facility]    CSN: [id number] Arrival date & time: 12/10/21  1358      History   Chief Complaint Chief Complaint  Patient presents with   Fever   Generalized Body Aches   Headache    HPI Carly Suarez is a 36 y.o. female.   HPI Patient presents for evaluation of body aches, headache, and fever x today. Last night she developed neck pain. Upon awakening this morning she developed fever, worsening generalized body aches, and headache. She has taken baclofen last night for neck pain and Nyquil today for body aches. No known sick contacts. Concern for possible influenza.  Past Medical History:  Diagnosis Date   ADD (attention deficit disorder)    History of chicken pox    History of shingles     Patient Active Problem List   Diagnosis Date Noted   Non-recurrent acute suppurative otitis media of right ear without spontaneous rupture of tympanic membrane 06/03/2021   Essential hypertension 04/12/2021   Eyelid abnormality 04/12/2021   Elevated blood pressure reading without diagnosis of hypertension 02/23/2021   ADD (attention deficit disorder) 01/29/2021   Positive ANA (antinuclear antibody) 05/08/2020   Chronic neck pain 05/08/2020   Bilateral hand pain 04/28/2020   Arthralgia 04/28/2020   Vulvar cyst 05/13/2019   Metatarsalgia of right foot 08/07/2018   Depression, major, single episode, moderate (HCC) 04/20/2017   GAD (generalized anxiety disorder) 04/20/2017   Cervical disc disease 04/20/2017   Abdominal pain, epigastric - episodic, ? 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 Authorizing Provider  amoxicillin (AMOXIL) 500 MG capsule Take 2 capsules (1,000 mg total) by mouth 2 (two) times daily. 04/15/21   Bedsole, Amy E, MD  benzonatate (TESSALON) 100 MG capsule Take 1 capsule (100 mg total) by mouth every 8 (eight) hours. 09/13/21   Orvil Feil, PA-C  DULoxetine (CYMBALTA) 30 MG capsule Take 3 capsules (90 mg total) by mouth daily. 02/23/21   Bedsole, Amy E, MD  losartan-hydrochlorothiazide (HYZAAR) 50-12.5 MG tablet TAKE 1 TABLET BY MOUTH EVERY DAY 10/12/21   Bedsole, Amy E, MD  metoprolol tartrate (LOPRESSOR) 25 MG tablet Take 1 tablet (25 mg total) by mouth daily as needed (BP > 160/100). Patient not taking: Reported on 04/15/2021 02/23/21   Excell Seltzer, MD  Norgestimate-Ethinyl Estradiol Triphasic (TRI-ESTARYLLA) 0.18/0.215/0.25 MG-35 MCG tablet Take 1 tablet by mouth daily. 04/23/21   Jerene Bears, MD  valACYclovir (VALTREX) 1000 MG tablet Take 1,000 mg by mouth daily as needed.    [provider]    Family History Family History  Problem Relation Age of Onset   Hypertension Mother    Hypertension Father    Prostate cancer Father [id number]   Melanoma Father [id number]   Diabetes Maternal Grandmother    Arthritis Maternal Grandmother    Hypertension Maternal Grandmother    Prostate cancer Maternal Grandfather    Heart disease Paternal Grandmother    Other Brother [phone]       Batten Disease - gentic disorder  - attacks brain and nervous system     Social History Social History   Tobacco Use   Smoking status: Never   Smokeless tobacco: Never  Vaping Use   Vaping Use: Never used  Substance Use Topics   Alcohol use: Yes    Comment: occasional   Drug use: No     Allergies   Patient has no  known allergies.  Review of Systems Review of Systems Pertinent negatives listed in HPI   Physical Exam Triage Vital Signs ED Triage Vitals  Enc Vitals Group     BP 12/10/21 1431 136/87     Pulse Rate 12/10/21 1431 (!) 121     Resp 12/10/21 1431 20     Temp 12/10/21 1431 (!) 100.5 F (38.1 C)     Temp Source 12/10/21 1431 Oral     SpO2 12/10/21 1431 98 %     Weight --      Height --      Head Circumference --      Peak Flow --      Pain Score 12/10/21 1434 6     Pain Loc --      Pain Edu? --      Excl. in GC? --    No data found.  Updated Vital Signs BP 136/87    Pulse (!) 121    Temp (!) 100.5 F (38.1 C) (Oral)    Resp 20    SpO2 98%   Visual Acuity Right Eye Distance:   Left Eye Distance:   Bilateral Distance:    Right Eye Near:   Left Eye Near:    Bilateral Near:     Physical Exam Constitutional:      Appearance: She is ill-appearing.  HENT:     Head: Normocephalic and atraumatic.     Mouth/Throat:     Mouth: Mucous membranes are moist.  Eyes:     Extraocular Movements: Extraocular movements intact.     Pupils: Pupils are equal, round, and reactive to light.  Cardiovascular:     Rate and Rhythm: Regular rhythm. Tachycardia present.  Pulmonary:     Effort: Pulmonary effort is normal.     Breath sounds: Normal breath sounds.  Musculoskeletal:        General: Normal range of motion.     Cervical back: Normal range of motion and neck supple. No rigidity.  Lymphadenopathy:     Cervical: Cervical adenopathy present.  Skin:    General: Skin is warm and dry.  Neurological:     Mental Status: She is alert and oriented to person, place, and time.  Psychiatric:        Mood and Affect: Mood normal.        Behavior: Behavior normal.     UC Treatments / Results  Labs (all labs ordered are listed, but only abnormal results are displayed) Labs Reviewed  NOVEL CORONAVIRUS, NAA  COVID-19, FLU A+B AND RSV  POCT INFLUENZA A/B    EKG  Radiology No  results found.  Procedures Procedures (including critical care time)  Medications Ordered in UC Medications  ibuprofen (ADVIL) tablet 600 mg (600 mg Oral Given 12/10/21 1456)    Initial Impression / Assessment and Plan / UC Course  I have reviewed the triage vital signs and the nursing notes.  Pertinent labs & imaging results that were available during my care of the patient were reviewed by me and considered in my medical decision making (see chart for details).    Viral Illness Rapid Flu negative. Viral testing pending. Symptom treatment warranted only . Ibuprofen given for headache. RTC PRN Final Clinical Impressions(s) / UC Diagnoses   Final diagnoses:  Body aches  Viral illness  Encounter for screening for COVID-19     Discharge Instructions      Your COVID 19/FLU//RSV results should result within  days. Negative results are immediately resulted to Mychart. Positive results will receive a follow-up call from our clinic. If symptoms are present, I recommend home quarantine until results are known.  Alternate Tylenol and ibuprofen as needed for body aches and fever.  Symptom management per recommendations discussed today.  If any breathing difficulty or chest pain develops go immediately to the closest emergency department for evaluation.      ED Prescriptions   None    PDMP not reviewed this encounter.   Bing Neighbors, FNP 12/11/21 0900

## 2021-12-12 LAB — COVID-19, FLU A+B AND RSV
Influenza A, NAA: NOT DETECTED
Influenza B, NAA: NOT DETECTED
RSV, NAA: NOT DETECTED
SARS-CoV-2, NAA: DETECTED — AB

## 2021-12-21 ENCOUNTER — Telehealth: Payer: Self-pay | Admitting: Family Medicine

## 2021-12-21 NOTE — Telephone Encounter (Signed)
No additional labs needed.

## 2021-12-21 NOTE — Telephone Encounter (Signed)
-----   Message from Alvina Chou sent at 12/21/2021  7:33 AM EST ----- Regarding: Lab orders for Thursday, 2.9.23 Patient is scheduled for CPX labs, please order future labs, Thanks , Terri  Has some orders in, did you want to add more?

## 2021-12-30 ENCOUNTER — Other Ambulatory Visit: Payer: PRIVATE HEALTH INSURANCE

## 2021-12-30 DIAGNOSIS — M9903 Segmental and somatic dysfunction of lumbar region: Secondary | ICD-10-CM | POA: Diagnosis not present

## 2021-12-30 DIAGNOSIS — M9901 Segmental and somatic dysfunction of cervical region: Secondary | ICD-10-CM | POA: Diagnosis not present

## 2021-12-30 DIAGNOSIS — M62830 Muscle spasm of back: Secondary | ICD-10-CM | POA: Diagnosis not present

## 2021-12-30 DIAGNOSIS — M9902 Segmental and somatic dysfunction of thoracic region: Secondary | ICD-10-CM | POA: Diagnosis not present

## 2021-12-31 ENCOUNTER — Other Ambulatory Visit: Payer: Self-pay | Admitting: Family Medicine

## 2021-12-31 MED ORDER — VALACYCLOVIR HCL 1 G PO TABS: 1000.0000 mg | ORAL_TABLET | Freq: Every day | ORAL | 0 refills | Status: DC | PRN

## 2021-12-31 NOTE — Telephone Encounter (Signed)
Last office visit 04/15/2021 for HTN and Ear Fullness.  Last refilled ? 03/04/2020 for #90 with 4 refills.  Currently listed as historical on medication list. CPE scheduled 01/07/2022.

## 2022-01-04 ENCOUNTER — Other Ambulatory Visit (INDEPENDENT_AMBULATORY_CARE_PROVIDER_SITE_OTHER): Payer: BC Managed Care – PPO

## 2022-01-04 ENCOUNTER — Other Ambulatory Visit: Payer: Self-pay

## 2022-01-04 DIAGNOSIS — I10 Essential (primary) hypertension: Secondary | ICD-10-CM | POA: Diagnosis not present

## 2022-01-05 LAB — LIPID PANEL
Cholesterol: 227 mg/dL — ABNORMAL HIGH (ref 0–200)
HDL: 73.8 mg/dL (ref >39.00)
LDL Cholesterol: 128 mg/dL — ABNORMAL HIGH (ref 0–99)
NonHDL: 153.68
Total CHOL/HDL Ratio: 3
Triglycerides: 130 mg/dL (ref 0.0–149.0)
VLDL: 26 mg/dL (ref 0.0–40.0)

## 2022-01-05 LAB — COMPREHENSIVE METABOLIC PANEL
ALT: 23 U/L (ref 0–35)
AST: 22 U/L (ref 0–37)
Albumin: 4.2 g/dL (ref 3.5–5.2)
Alkaline Phosphatase: 53 U/L (ref 39–117)
BUN: 12 mg/dL (ref 6–23)
CO2: 25 mEq/L (ref 19–32)
Calcium: 9.3 mg/dL (ref 8.4–10.5)
Chloride: 104 mEq/L (ref 96–112)
Creatinine, Ser: 0.69 mg/dL (ref 0.40–1.20)
GFR: 112.09 mL/min (ref >60.00)
Glucose, Bld: 91 mg/dL (ref 70–99)
Potassium: 4 mEq/L (ref 3.5–5.1)
Sodium: 137 mEq/L (ref 135–145)
Total Bilirubin: 0.6 mg/dL (ref 0.2–1.2)
Total Protein: 6.7 g/dL (ref 6.0–8.3)

## 2022-01-06 NOTE — Progress Notes (Signed)
No critical labs need to be addressed urgently. We will discuss labs in detail at upcoming office visit.   

## 2022-01-07 ENCOUNTER — Ambulatory Visit (INDEPENDENT_AMBULATORY_CARE_PROVIDER_SITE_OTHER): Payer: BC Managed Care – PPO | Admitting: Family Medicine

## 2022-01-07 ENCOUNTER — Other Ambulatory Visit: Payer: Self-pay

## 2022-01-07 VITALS — BP 130/90 | HR 96 | Temp 99.0°F | Ht 63.0 in | Wt 155.4 lb

## 2022-01-07 DIAGNOSIS — F411 Generalized anxiety disorder: Secondary | ICD-10-CM | POA: Diagnosis not present

## 2022-01-07 DIAGNOSIS — Z0000 Encounter for general adult medical examination without abnormal findings: Secondary | ICD-10-CM | POA: Diagnosis not present

## 2022-01-07 DIAGNOSIS — I10 Essential (primary) hypertension: Secondary | ICD-10-CM

## 2022-01-07 NOTE — Progress Notes (Signed)
Patient ID: VASUDHA MARINACCIO, female    DOB: 1986/07/11, 36 y.o.   MRN: [id number]  This visit was conducted in person.  BP 130/90    Pulse 96    Temp 99 F (37.2 C) (Oral)    Ht 5\' 3"  (1.6 m)    Wt 155 lb 7 oz (70.5 kg)    LMP 12/29/2021 (Exact Date)    SpO2 100%    BMI 27.53 kg/m    CC: Chief Complaint  Patient presents with   Annual Exam    Subjective:   HPI: TRUDIE TIMM is a 36 y.o. female presenting on 01/07/2022 for Annual Exam   She has  stopped all her mediation for mood Reviewed  labs in detail.  She has stopped her birth control.   She is working on trying to get pregnant.  She is taking prenatal vitamin.  GAD:  Cymbalta helped minimally with mood and anxiety.  Husband is supportive. Melatonin helping her fall asleep but not stay asleep.  She stopped the losartan given it cause her arms to fall asleep. No checking BP at home. BP Readings from Last 3 Encounters:  01/07/22 130/90  12/10/21 136/87  09/13/21 132/83     Lab Results  Component Value Date   CHOL 227 (H) 01/04/2022   HDL 73.80 01/04/2022   LDLCALC 128 (H) 01/04/2022   TRIG 130.0 01/04/2022   CHOLHDL 3 01/04/2022    Diet: moderate.. does eat cheese daily.  Exercise: none     Relevant past medical, surgical, family and social history reviewed and updated as indicated. Interim medical history since our last visit reviewed. Allergies and medications reviewed and updated. Outpatient Medications Prior to Visit  Medication Sig Dispense Refill   valACYclovir (VALTREX) 1000 MG tablet Take 1 tablet (1,000 mg total) by mouth daily as needed. 30 tablet 0   amoxicillin (AMOXIL) 500 MG capsule Take 2 capsules (1,000 mg total) by mouth 2 (two) times daily. 40 capsule 0   benzonatate (TESSALON) 100 MG capsule Take 1 capsule (100 mg total) by mouth every 8 (eight) hours. 21 capsule 0   DULoxetine (CYMBALTA) 30 MG capsule Take 3 capsules (90 mg total) by mouth daily. 90 capsule 3    losartan-hydrochlorothiazide (HYZAAR) 50-12.5 MG tablet TAKE 1 TABLET BY MOUTH EVERY DAY 90 tablet 0   metoprolol tartrate (LOPRESSOR) 25 MG tablet Take 1 tablet (25 mg total) by mouth daily as needed (BP > 160/100). (Patient not taking: Reported on 04/15/2021) 20 tablet 0   Norgestimate-Ethinyl Estradiol Triphasic (TRI-ESTARYLLA) 0.18/0.215/0.25 MG-35 MCG tablet Take 1 tablet by mouth daily. 28 tablet 0   No facility-administered medications prior to visit.     Per HPI unless specifically indicated in ROS section below Review of Systems  Constitutional:  Negative for fatigue and fever.  HENT:  Negative for congestion.   Eyes:  Negative for pain.  Respiratory:  Negative for cough and shortness of breath.   Cardiovascular:  Negative for chest pain, palpitations and leg swelling.  Gastrointestinal:  Negative for abdominal pain.  Genitourinary:  Negative for dysuria and vaginal bleeding.  Musculoskeletal:  Negative for back pain.  Neurological:  Negative for syncope, light-headedness and headaches.  Psychiatric/Behavioral:  Negative for dysphoric mood.   Objective:  BP 130/90    Pulse 96    Temp 99 F (37.2 C) (Oral)    Ht 5\' 3"  (1.6 m)    Wt 155 lb 7 oz (70.5 kg)    LMP 12/29/2021 (  Exact Date)    SpO2 100%    BMI 27.53 kg/m   Wt Readings from Last 3 Encounters:  01/07/22 155 lb 7 oz (70.5 kg)  04/15/21 144 lb (65.3 kg)  04/12/21 140 lb (63.5 kg)      Physical Exam Vitals and nursing note reviewed.  Constitutional:      General: She is not in acute distress.    Appearance: Normal appearance. She is well-developed. She is not ill-appearing or toxic-appearing.  HENT:     Head: Normocephalic.     Right Ear: Hearing, tympanic membrane, ear canal and external ear normal.     Left Ear: Hearing, tympanic membrane, ear canal and external ear normal.     Nose: Nose normal.  Eyes:     General: Lids are normal. Lids are everted, no foreign bodies appreciated.     Conjunctiva/sclera:  Conjunctivae normal.     Pupils: Pupils are equal, round, and reactive to light.  Neck:     Thyroid: No thyroid mass or thyromegaly.     Vascular: No carotid bruit.     Trachea: Trachea normal.  Cardiovascular:     Rate and Rhythm: Normal rate and regular rhythm.     Heart sounds: Normal heart sounds, S1 normal and S2 normal. No murmur heard.   No gallop.  Pulmonary:     Effort: Pulmonary effort is normal. No respiratory distress.     Breath sounds: Normal breath sounds. No wheezing, rhonchi or rales.  Abdominal:     General: Bowel sounds are normal. There is no distension or abdominal bruit.     Palpations: Abdomen is soft. There is no fluid wave or mass.     Tenderness: There is no abdominal tenderness. There is no guarding or rebound.     Hernia: No hernia is present.  Musculoskeletal:     Cervical back: Normal range of motion and neck supple.  Lymphadenopathy:     Cervical: No cervical adenopathy.  Skin:    General: Skin is warm and dry.     Findings: No rash.  Neurological:     Mental Status: She is alert.     Cranial Nerves: No cranial nerve deficit.     Sensory: No sensory deficit.  Psychiatric:        Mood and Affect: Mood is not anxious or depressed.        Speech: Speech normal.        Behavior: Behavior normal. Behavior is cooperative.        Judgment: Judgment normal.      Results for orders placed or performed in visit on 01/04/22  Lipid panel  Result Value Ref Range   Cholesterol 227 (H) 0 - 200 mg/dL   Triglycerides 629.5 0.0 - 149.0 mg/dL   HDL 28.41 >32.44 mg/dL   VLDL 01.0 0.0 - 27.2 mg/dL   LDL Cholesterol 536 (H) 0 - 99 mg/dL   Total CHOL/HDL Ratio 3    NonHDL 153.68   Comprehensive metabolic panel  Result Value Ref Range   Sodium 137 135 - 145 mEq/L   Potassium 4.0 3.5 - 5.1 mEq/L   Chloride 104 96 - 112 mEq/L   CO2 25 19 - 32 mEq/L   Glucose, Bld 91 70 - 99 mg/dL   BUN 12 6 - 23 mg/dL   Creatinine, Ser 6.44 0.40 - 1.20 mg/dL   Total  Bilirubin 0.6 0.2 - 1.2 mg/dL   Alkaline Phosphatase 53 39 - 117 U/L  AST 22 0 - 37 U/L   ALT 23 0 - 35 U/L   Total Protein 6.7 6.0 - 8.3 g/dL   Albumin 4.2 3.5 - 5.2 g/dL   GFR 161.09 >60.45 mL/min   Calcium 9.3 8.4 - 10.5 mg/dL    This visit occurred during the SARS-CoV-2 public health emergency.  Safety protocols were in place, including screening questions prior to the visit, additional usage of staff PPE, and extensive cleaning of exam room while observing appropriate contact time as indicated for disinfecting solutions.   COVID 19 screen:  No recent travel or known exposure to COVID19 The patient denies respiratory symptoms of COVID 19 at this time. The importance of social distancing was discussed today.   Assessment and Plan The patient's preventative maintenance and recommended screening tests for an annual wellness exam were reviewed in full today. Brought up to date unless services declined.  Counselled on the importance of diet, exercise, and its role in overall health and mortality. The patient's FH and SH was reviewed, including their home life, tobacco status, and drug and alcohol status.    Vaccines: Tdap uptodate 2017, has received HPV x 3, COVID x 2, refused flu. PAP/Breast: per GYN.. 02/2020 neg pap, no early family history of breast cancer Colon: no early family history.  Nonsmoker  STD testing: refused  Hep C: done ETOH  decreased, 3-4 times a week 1-2 glasses of wine... encouraged to decrease given trying to get pregnant.    Problem List Items Addressed This Visit     Essential hypertension    She has stopped losartan.. BP borderline elevated.. follow at home and call if > 140/90 consistently.  Remain off ACEI as trying to get pregnant.      GAD (generalized anxiety disorder)    She is trying to get pregnant so has stopped all her medications. She is not interested in counseling or medication to treat mood.      Other Visit Diagnoses     Routine  general medical examination at a health care facility    -  Primary        Kerby Nora, MD

## 2022-01-07 NOTE — Assessment & Plan Note (Signed)
She has stopped losartan.. BP borderline elevated.. follow at home and call if > 140/90 consistently.  Remain off ACEI as trying to get pregnant.

## 2022-01-07 NOTE — Patient Instructions (Signed)
Follow blood pressure at home.. call if > 140/90.  Work on Tree surgeon.

## 2022-01-07 NOTE — Assessment & Plan Note (Signed)
She is trying to get pregnant so has stopped all her medications. She is not interested in counseling or medication to treat mood.

## 2022-01-13 DIAGNOSIS — M62830 Muscle spasm of back: Secondary | ICD-10-CM | POA: Diagnosis not present

## 2022-01-13 DIAGNOSIS — M9902 Segmental and somatic dysfunction of thoracic region: Secondary | ICD-10-CM | POA: Diagnosis not present

## 2022-01-13 DIAGNOSIS — M9903 Segmental and somatic dysfunction of lumbar region: Secondary | ICD-10-CM | POA: Diagnosis not present

## 2022-01-13 DIAGNOSIS — M9901 Segmental and somatic dysfunction of cervical region: Secondary | ICD-10-CM | POA: Diagnosis not present

## 2022-01-25 ENCOUNTER — Other Ambulatory Visit: Payer: Self-pay | Admitting: Family Medicine

## 2022-01-25 DIAGNOSIS — Z31430 Encounter of female for testing for genetic disease carrier status for procreative management: Secondary | ICD-10-CM | POA: Diagnosis not present

## 2022-01-25 NOTE — Telephone Encounter (Signed)
Last office visit 01/07/2022 for CPE.  Last refilled 12/31/21 for #30 with no refills.  No future appointments.  ?

## 2022-01-26 DIAGNOSIS — M9902 Segmental and somatic dysfunction of thoracic region: Secondary | ICD-10-CM | POA: Diagnosis not present

## 2022-01-26 DIAGNOSIS — M9903 Segmental and somatic dysfunction of lumbar region: Secondary | ICD-10-CM | POA: Diagnosis not present

## 2022-01-26 DIAGNOSIS — M9901 Segmental and somatic dysfunction of cervical region: Secondary | ICD-10-CM | POA: Diagnosis not present

## 2022-01-26 DIAGNOSIS — M62830 Muscle spasm of back: Secondary | ICD-10-CM | POA: Diagnosis not present

## 2022-02-03 DIAGNOSIS — M9901 Segmental and somatic dysfunction of cervical region: Secondary | ICD-10-CM | POA: Diagnosis not present

## 2022-02-03 DIAGNOSIS — M9903 Segmental and somatic dysfunction of lumbar region: Secondary | ICD-10-CM | POA: Diagnosis not present

## 2022-02-03 DIAGNOSIS — M9902 Segmental and somatic dysfunction of thoracic region: Secondary | ICD-10-CM | POA: Diagnosis not present

## 2022-02-03 DIAGNOSIS — M62830 Muscle spasm of back: Secondary | ICD-10-CM | POA: Diagnosis not present

## 2022-02-24 DIAGNOSIS — M9902 Segmental and somatic dysfunction of thoracic region: Secondary | ICD-10-CM | POA: Diagnosis not present

## 2022-02-24 DIAGNOSIS — M9903 Segmental and somatic dysfunction of lumbar region: Secondary | ICD-10-CM | POA: Diagnosis not present

## 2022-02-24 DIAGNOSIS — M9901 Segmental and somatic dysfunction of cervical region: Secondary | ICD-10-CM | POA: Diagnosis not present

## 2022-02-24 DIAGNOSIS — M62830 Muscle spasm of back: Secondary | ICD-10-CM | POA: Diagnosis not present

## 2022-03-03 DIAGNOSIS — M62830 Muscle spasm of back: Secondary | ICD-10-CM | POA: Diagnosis not present

## 2022-03-03 DIAGNOSIS — M9902 Segmental and somatic dysfunction of thoracic region: Secondary | ICD-10-CM | POA: Diagnosis not present

## 2022-03-03 DIAGNOSIS — M9903 Segmental and somatic dysfunction of lumbar region: Secondary | ICD-10-CM | POA: Diagnosis not present

## 2022-03-03 DIAGNOSIS — M9901 Segmental and somatic dysfunction of cervical region: Secondary | ICD-10-CM | POA: Diagnosis not present

## 2022-03-17 ENCOUNTER — Ambulatory Visit: Payer: BC Managed Care – PPO | Admitting: Podiatry

## 2022-03-17 ENCOUNTER — Encounter: Payer: Self-pay | Admitting: Podiatry

## 2022-03-17 DIAGNOSIS — L600 Ingrowing nail: Secondary | ICD-10-CM

## 2022-03-17 NOTE — Progress Notes (Signed)
?  Subjective:  ?Patient ID: Carly Suarez, female    DOB: 1986/07/12,  MRN: [id number] ? ?Chief Complaint  ?Patient presents with  ? Ingrown Toenail  ? ? ?36 y.o. female presents with the above complaint.  Patient presents with right medial border ingrown.  Patient states painful to touch is progressive gotten worse.  She would like to have it removed.  She states that she has had previous ingrown removed in the past which seems to help.  She denies any other acute complaints.  She has not seen anyone else prior to seeing me for this. ? ? ?Review of Systems: Negative except as noted in the HPI. Denies N/V/F/Ch. ? ?Past Medical History:  ?Diagnosis Date  ? ADD (attention deficit disorder)   ? History of chicken pox   ? History of shingles   ? ? ?Current Outpatient Medications:  ?  baclofen (LIORESAL) 10 MG tablet, Take 10 mg by mouth 3 (three) times daily., Disp: , Rfl:  ?  valACYclovir (VALTREX) 1000 MG tablet, TAKE 1 TABLET (1,000 MG TOTAL) BY MOUTH DAILY AS NEEDED., Disp: 30 tablet, Rfl: 3 ? ?Social History  ? ?Tobacco Use  ?Smoking Status Never  ?Smokeless Tobacco Never  ? ? ?No Known Allergies ?Objective:  ?There were no vitals filed for this visit. ?There is no height or weight on file to calculate BMI. ?Constitutional Well developed. ?Well nourished.  ?Vascular Dorsalis pedis pulses palpable bilaterally. ?Posterior tibial pulses palpable bilaterally. ?Capillary refill normal to all digits.  ?No cyanosis or clubbing noted. ?Pedal hair growth normal.  ?Neurologic Normal speech. ?Oriented to person, place, and time. ?Epicritic sensation to light touch grossly present bilaterally.  ?Dermatologic Painful ingrowing nail at medial nail borders of the hallux nail right. ?No other open wounds. ?No skin lesions.  ?Orthopedic: Normal joint ROM without pain or crepitus bilaterally. ?No visible deformities. ?No bony tenderness.  ? ?Radiographs: None ?Assessment:  ? ?1. Ingrown toenail of right foot   ? ?Plan:  ?Patient was  evaluated and treated and all questions answered. ? ?Ingrown Nail, right ?-Patient elects to proceed with minor surgery to remove ingrown toenail removal today. Consent reviewed and signed by patient. ?-Ingrown nail excised. See procedure note. ?-Educated on post-procedure care including soaking. Written instructions provided and reviewed. ?-Patient to follow up in 2 weeks for nail check. ? ?Procedure: Excision of Ingrown Toenail ?Location: Right 1st toe medial nail borders. ?Anesthesia: Lidocaine 1% plain; 1.5 mL and Marcaine 0.5% plain; 1.5 mL, digital block. ?Skin Prep: Betadine. ?Dressing: Silvadene; telfa; dry, sterile, compression dressing. ?Technique: Following skin prep, the toe was exsanguinated and a tourniquet was secured at the base of the toe. The affected nail border was freed, split with a nail splitter, and excised. Chemical matrixectomy was then performed with phenol and irrigated out with alcohol. The tourniquet was then removed and sterile dressing applied. ?Disposition: Patient tolerated procedure well. Patient to return in 2 weeks for follow-up.  ? ?No follow-ups on file. ?

## 2022-03-22 ENCOUNTER — Ambulatory Visit: Payer: BC Managed Care – PPO | Admitting: Podiatry

## 2022-03-26 IMAGING — DX DG HAND COMPLETE 3+V*R*
3 series · 3 of 3 positions shown · non-contrast
Comparison: 04/28/2020

CLINICAL DATA: Bilateral hand pain predominately in the MCP and PIP
joints.

EXAM:
RIGHT HAND - COMPLETE 3+ VIEW

[hand ap]
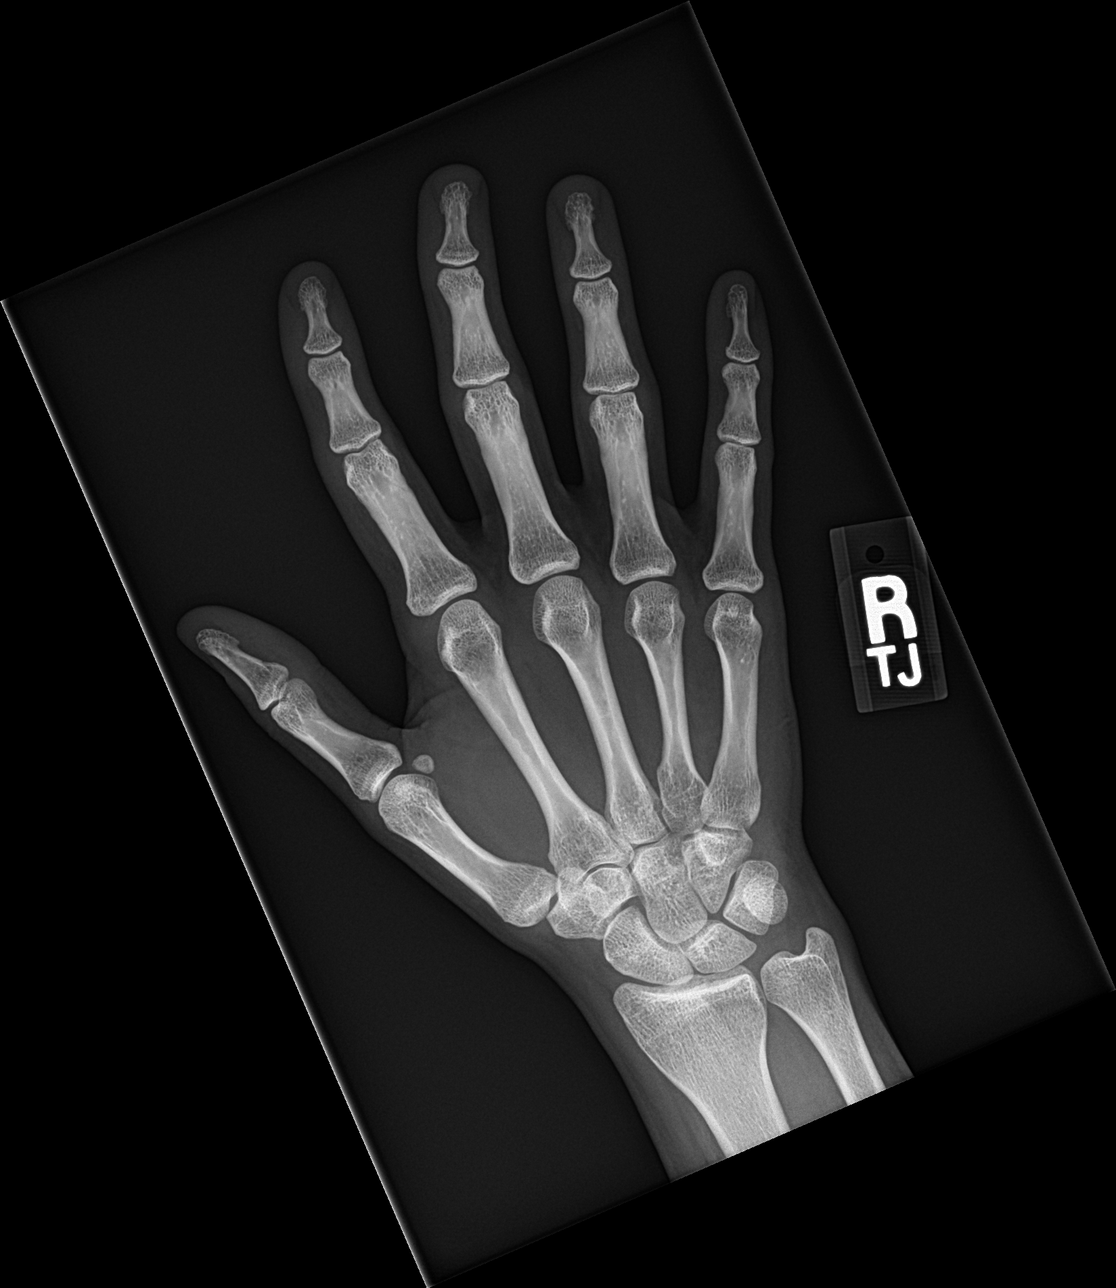

[hand obl]
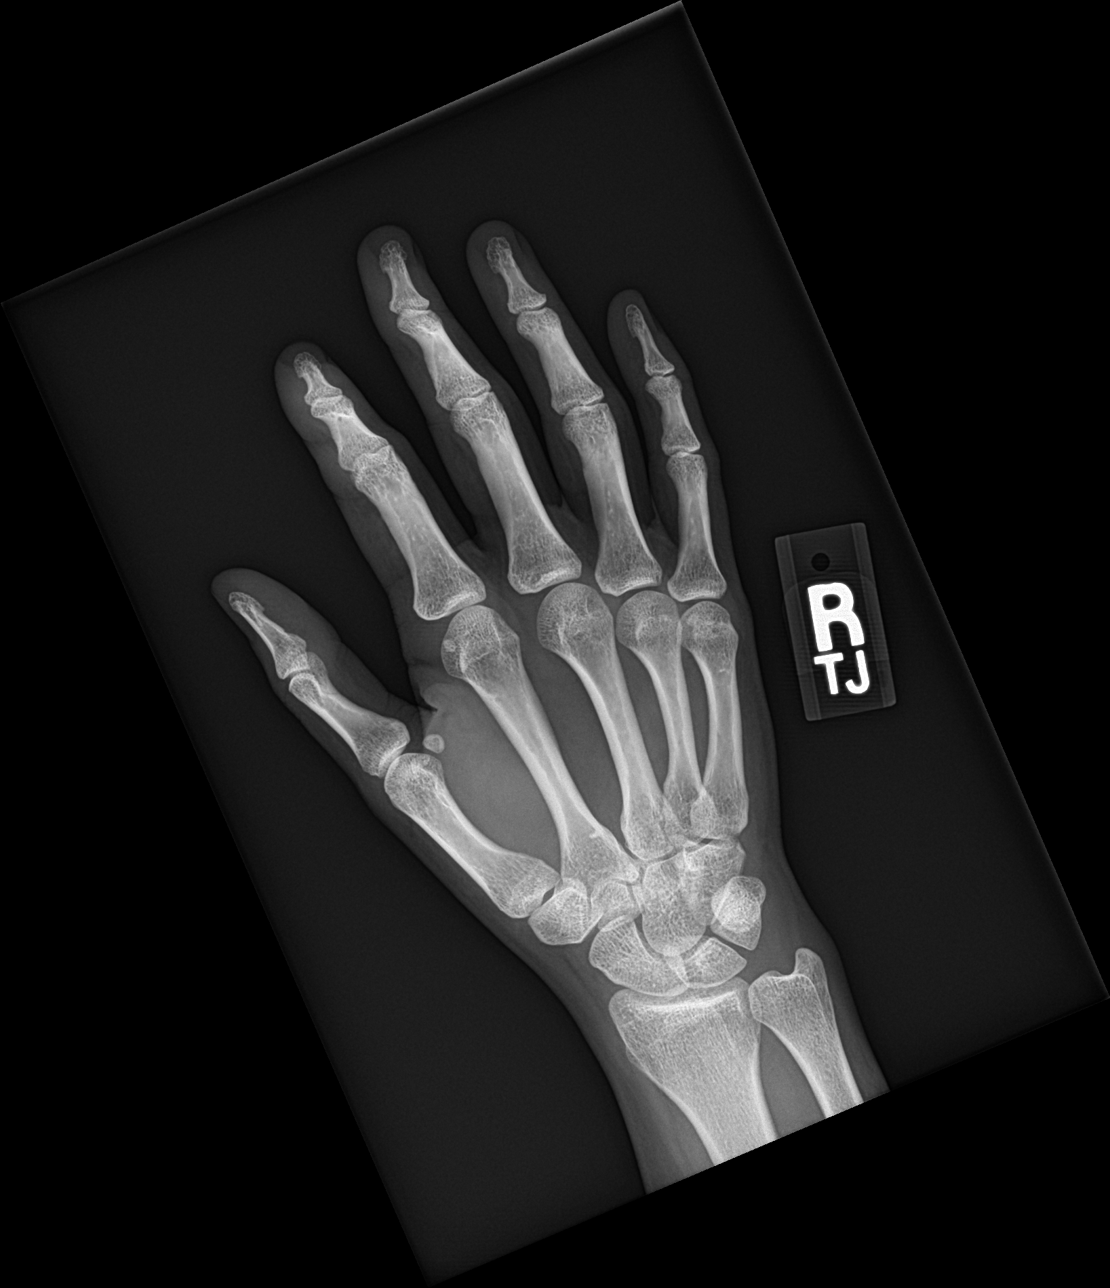

[hand lat]
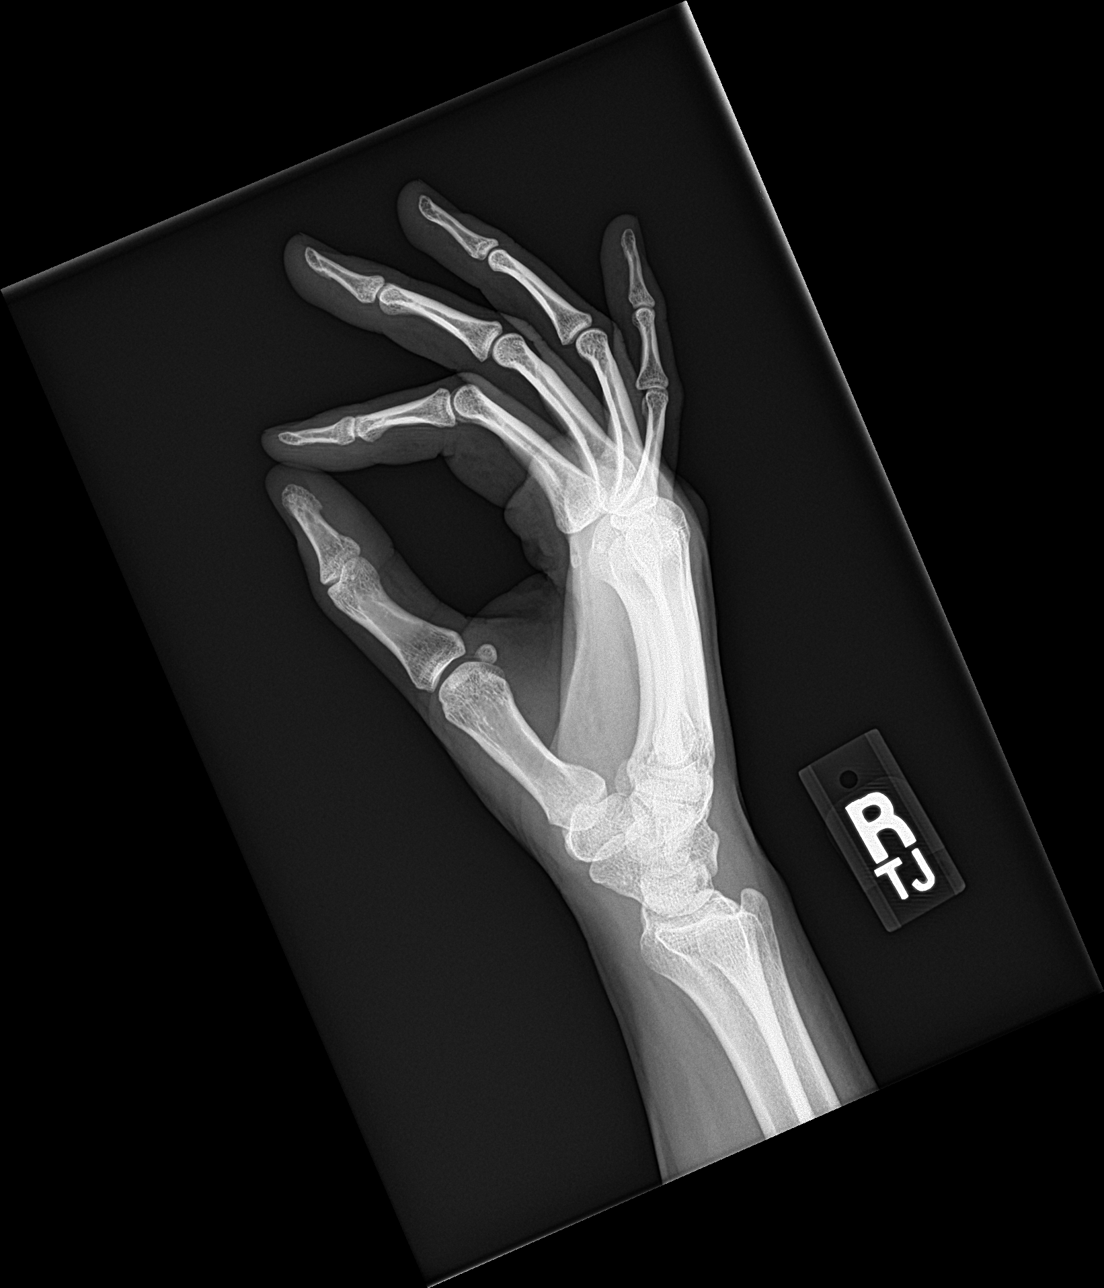

[3 of 3 positions shown; findings below may reference images not displayed]

FINDINGS: There is no evidence of fracture or dislocation. There is no
evidence of arthropathy or other focal bone abnormality. Soft
tissues are unremarkable.
IMPRESSION: Negative.

## 2022-03-26 IMAGING — DX DG HAND COMPLETE 3+V*L*
3 series · 3 of 3 positions shown · non-contrast
Comparison: 04/28/2020

CLINICAL DATA: Bilateral hand pain and stiffness

EXAM:
LEFT HAND - COMPLETE 3+ VIEW

[hand ap]
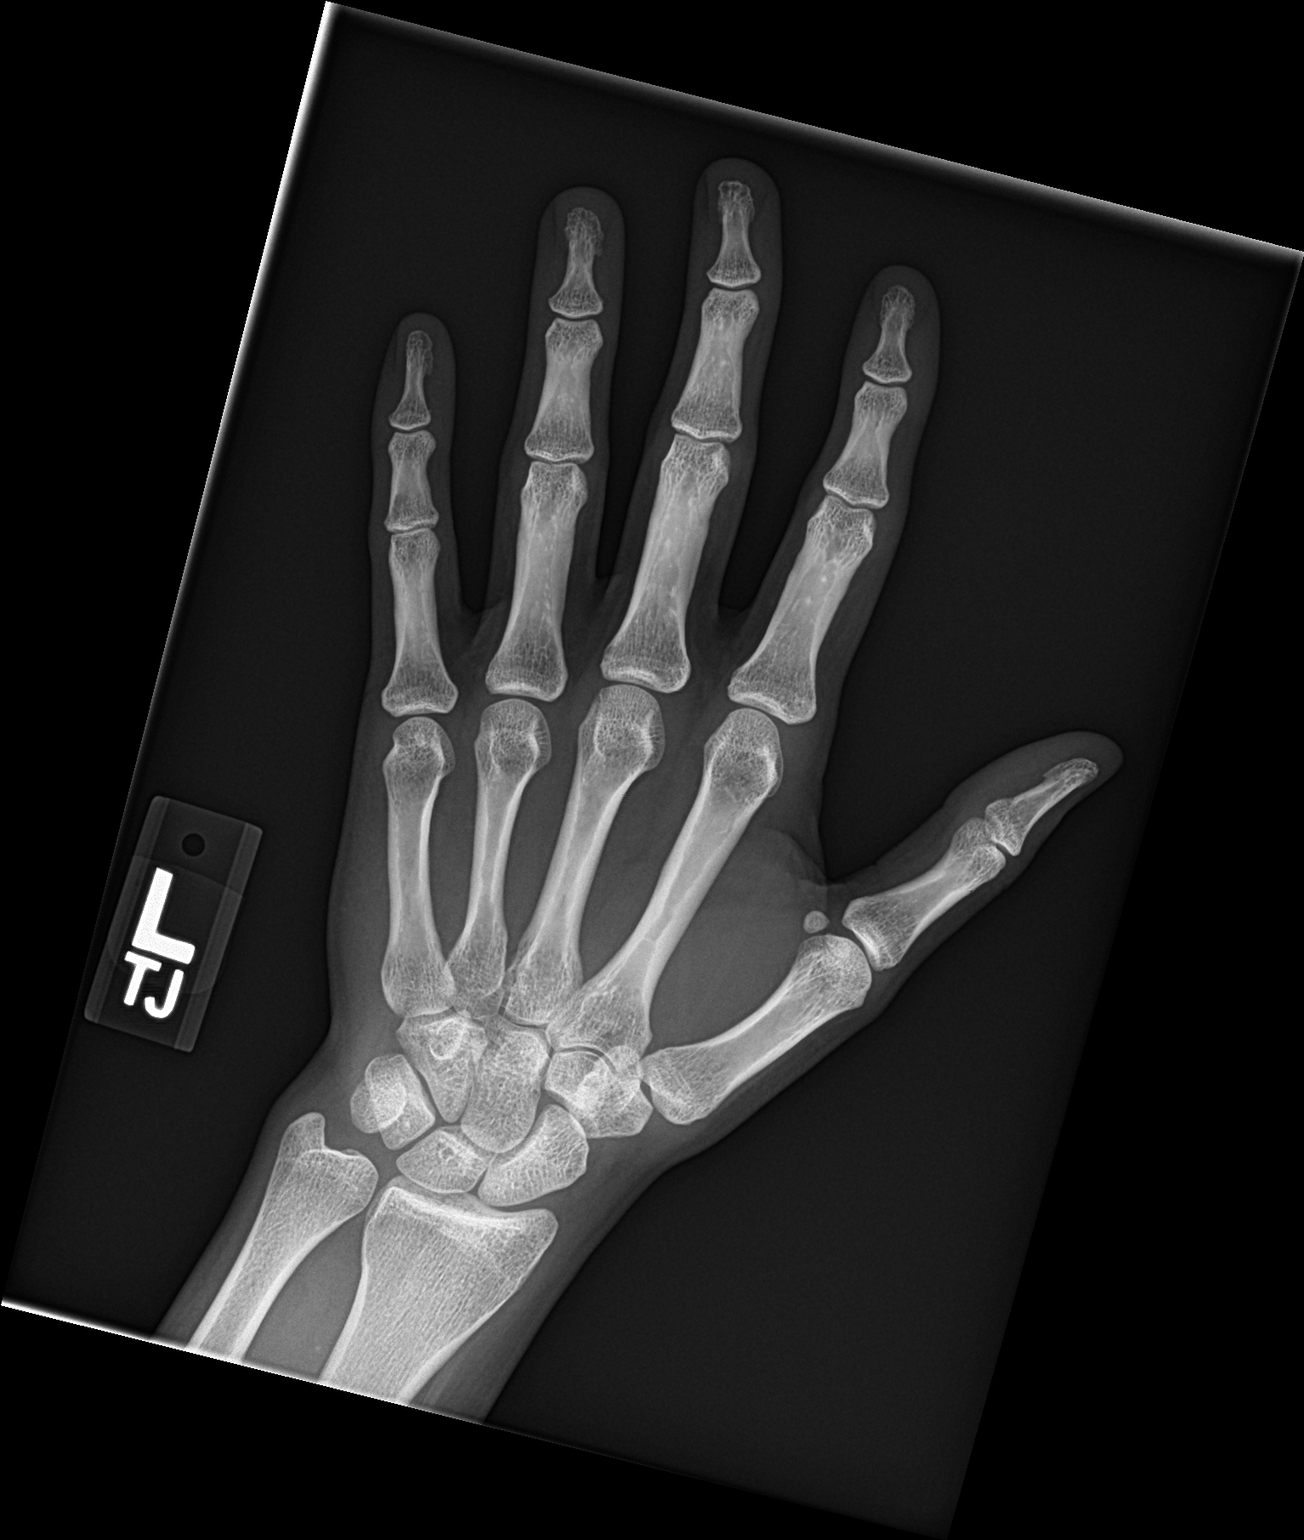

[hand obl]
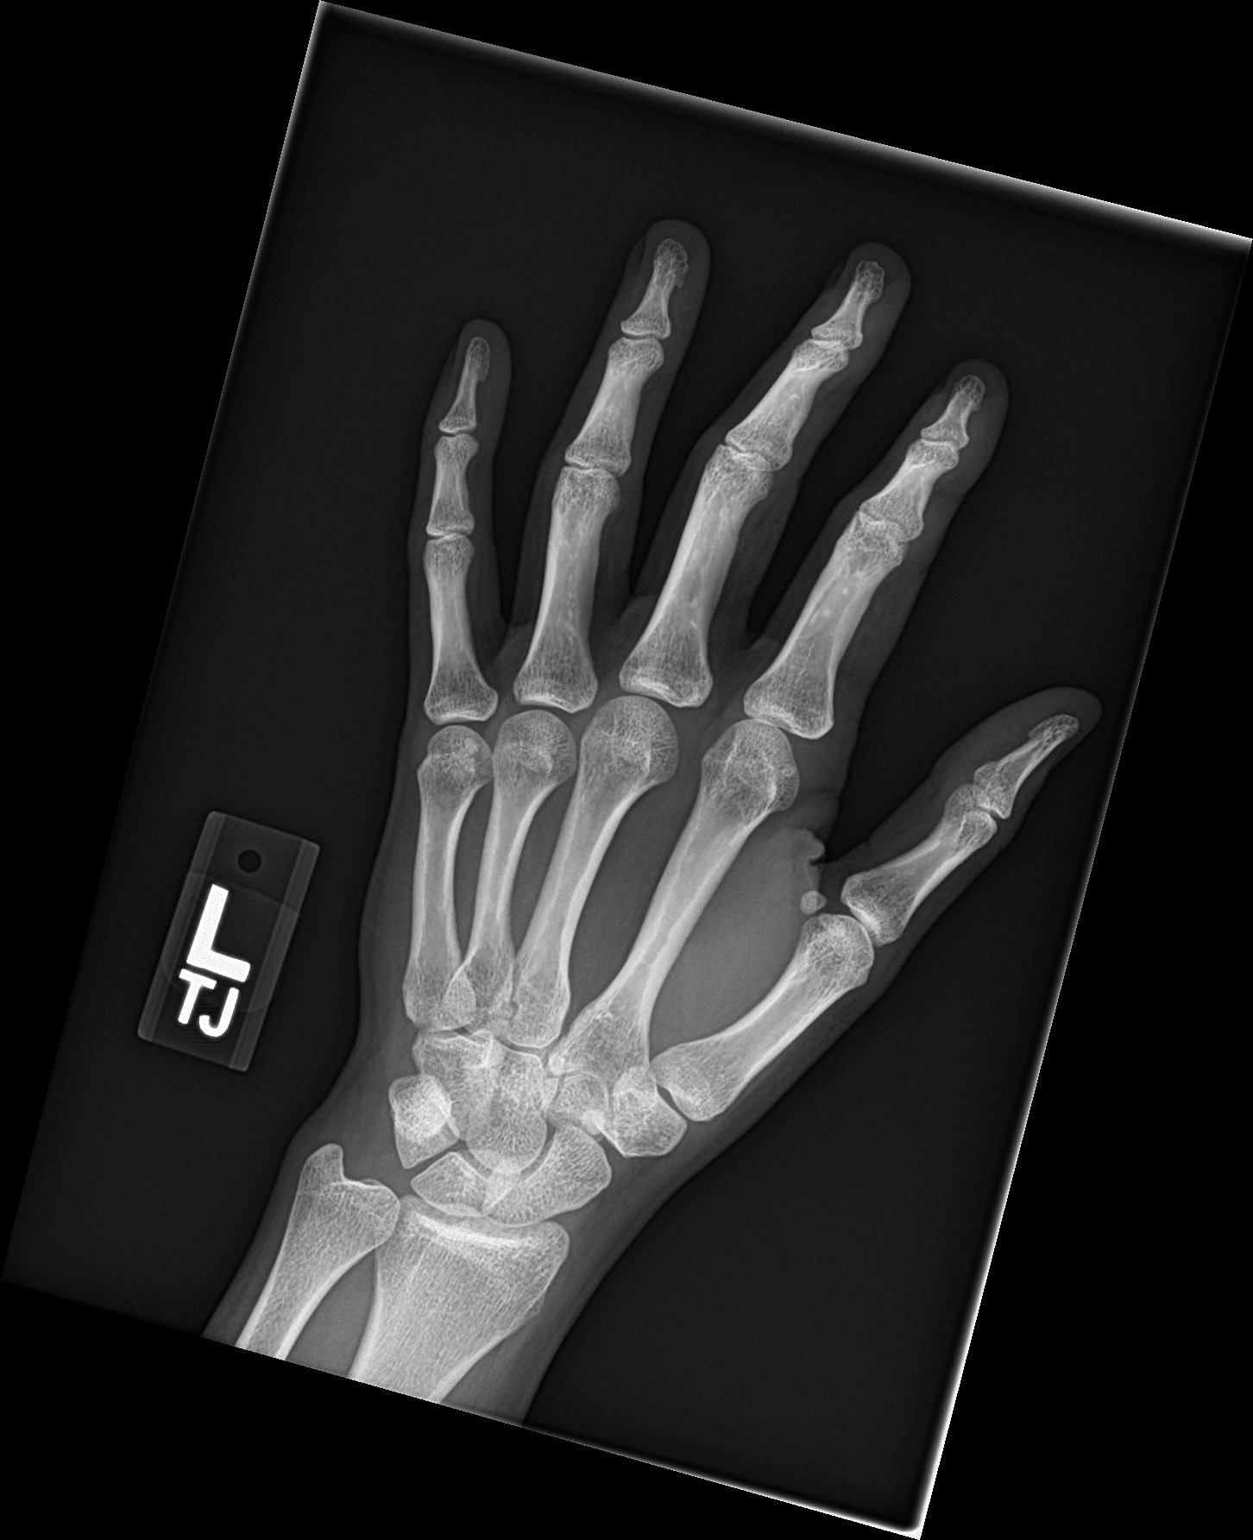

[hand lat]
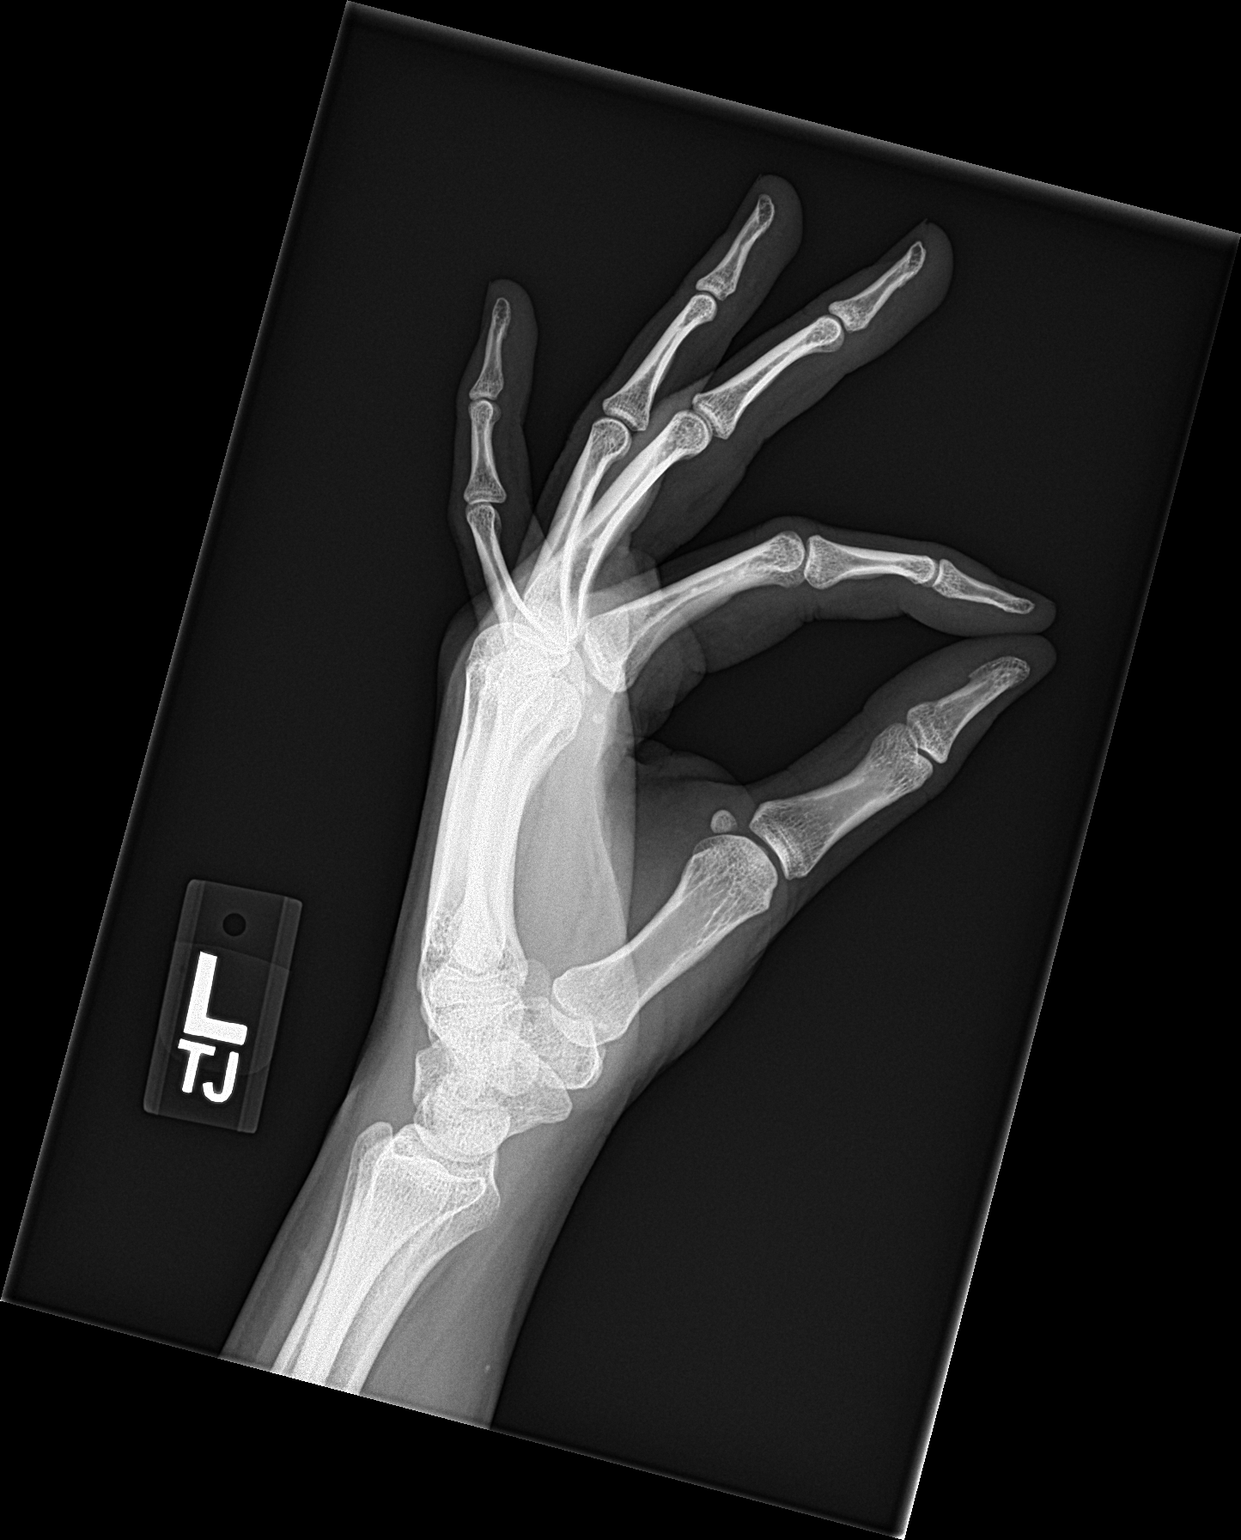

[3 of 3 positions shown; findings below may reference images not displayed]

FINDINGS: There is no evidence of fracture or dislocation. There is no
evidence of arthropathy or other focal bone abnormality. Soft
tissues are unremarkable.
IMPRESSION: Negative.

## 2022-03-29 DIAGNOSIS — M9902 Segmental and somatic dysfunction of thoracic region: Secondary | ICD-10-CM | POA: Diagnosis not present

## 2022-03-29 DIAGNOSIS — M9903 Segmental and somatic dysfunction of lumbar region: Secondary | ICD-10-CM | POA: Diagnosis not present

## 2022-03-29 DIAGNOSIS — M62830 Muscle spasm of back: Secondary | ICD-10-CM | POA: Diagnosis not present

## 2022-03-29 DIAGNOSIS — M9901 Segmental and somatic dysfunction of cervical region: Secondary | ICD-10-CM | POA: Diagnosis not present

## 2022-04-12 DIAGNOSIS — M62830 Muscle spasm of back: Secondary | ICD-10-CM | POA: Diagnosis not present

## 2022-04-12 DIAGNOSIS — M9901 Segmental and somatic dysfunction of cervical region: Secondary | ICD-10-CM | POA: Diagnosis not present

## 2022-04-12 DIAGNOSIS — M9903 Segmental and somatic dysfunction of lumbar region: Secondary | ICD-10-CM | POA: Diagnosis not present

## 2022-04-12 DIAGNOSIS — M9902 Segmental and somatic dysfunction of thoracic region: Secondary | ICD-10-CM | POA: Diagnosis not present

## 2022-04-13 DIAGNOSIS — M9902 Segmental and somatic dysfunction of thoracic region: Secondary | ICD-10-CM | POA: Diagnosis not present

## 2022-04-13 DIAGNOSIS — M62830 Muscle spasm of back: Secondary | ICD-10-CM | POA: Diagnosis not present

## 2022-04-13 DIAGNOSIS — M9901 Segmental and somatic dysfunction of cervical region: Secondary | ICD-10-CM | POA: Diagnosis not present

## 2022-04-13 DIAGNOSIS — M9903 Segmental and somatic dysfunction of lumbar region: Secondary | ICD-10-CM | POA: Diagnosis not present

## 2022-04-21 DIAGNOSIS — M9903 Segmental and somatic dysfunction of lumbar region: Secondary | ICD-10-CM | POA: Diagnosis not present

## 2022-04-21 DIAGNOSIS — M9901 Segmental and somatic dysfunction of cervical region: Secondary | ICD-10-CM | POA: Diagnosis not present

## 2022-04-21 DIAGNOSIS — M9902 Segmental and somatic dysfunction of thoracic region: Secondary | ICD-10-CM | POA: Diagnosis not present

## 2022-04-21 DIAGNOSIS — M62830 Muscle spasm of back: Secondary | ICD-10-CM | POA: Diagnosis not present

## 2022-04-27 ENCOUNTER — Ambulatory Visit: Payer: BC Managed Care – PPO | Admitting: Podiatry

## 2022-04-27 ENCOUNTER — Encounter: Payer: Self-pay | Admitting: Podiatry

## 2022-04-27 DIAGNOSIS — L600 Ingrowing nail: Secondary | ICD-10-CM | POA: Diagnosis not present

## 2022-04-27 NOTE — Progress Notes (Signed)
  Subjective:  Patient ID: Carly Suarez, female    DOB: September 10, 1986,  MRN: [id number]  Chief Complaint  Patient presents with   Nail Problem    "I just feel that my toenail would look better by now.  I had it done in April.  It doesn't hurt."    36 y.o. female presents with the above complaint. History confirmed with patient.  She is here for follow-up from ingrown toenail procedure on 03/17/2022 with Dr. Allena Katz was concerned that it was not healing correctly and is getting ready to go on vacation  Objective:  Physical Exam: warm, good capillary refill, no trophic changes or ulcerative lesions, normal DP and PT pulses, normal sensory exam, and right hallux matricectomy site is healing with scab there is noes signs of infection Assessment:   1. Ingrown toenail of right foot      Plan:  Patient was evaluated and treated and all questions answered.  Appears to be healing well I debrided the remaining scab and advised may take some more time to heal completely.  There is some bruising under the nail that we will need to grow out completely.  She may leave open to air and is okay to get pedicures and go on her trip.  Return if symptoms worsen or fail to improve.

## 2022-04-28 DIAGNOSIS — M62830 Muscle spasm of back: Secondary | ICD-10-CM | POA: Diagnosis not present

## 2022-04-28 DIAGNOSIS — M9901 Segmental and somatic dysfunction of cervical region: Secondary | ICD-10-CM | POA: Diagnosis not present

## 2022-04-28 DIAGNOSIS — M9902 Segmental and somatic dysfunction of thoracic region: Secondary | ICD-10-CM | POA: Diagnosis not present

## 2022-04-28 DIAGNOSIS — M9903 Segmental and somatic dysfunction of lumbar region: Secondary | ICD-10-CM | POA: Diagnosis not present

## 2022-05-12 DIAGNOSIS — M9901 Segmental and somatic dysfunction of cervical region: Secondary | ICD-10-CM | POA: Diagnosis not present

## 2022-05-12 DIAGNOSIS — M9903 Segmental and somatic dysfunction of lumbar region: Secondary | ICD-10-CM | POA: Diagnosis not present

## 2022-05-12 DIAGNOSIS — M9902 Segmental and somatic dysfunction of thoracic region: Secondary | ICD-10-CM | POA: Diagnosis not present

## 2022-05-12 DIAGNOSIS — M62830 Muscle spasm of back: Secondary | ICD-10-CM | POA: Diagnosis not present

## 2022-05-17 DIAGNOSIS — I10 Essential (primary) hypertension: Secondary | ICD-10-CM | POA: Diagnosis not present

## 2022-05-17 DIAGNOSIS — M5481 Occipital neuralgia: Secondary | ICD-10-CM | POA: Diagnosis not present

## 2022-05-17 DIAGNOSIS — Z3A01 Less than 8 weeks gestation of pregnancy: Secondary | ICD-10-CM | POA: Diagnosis not present

## 2022-05-17 DIAGNOSIS — G43119 Migraine with aura, intractable, without status migrainosus: Secondary | ICD-10-CM | POA: Diagnosis not present

## 2022-05-20 DIAGNOSIS — M9903 Segmental and somatic dysfunction of lumbar region: Secondary | ICD-10-CM | POA: Diagnosis not present

## 2022-05-20 DIAGNOSIS — M62830 Muscle spasm of back: Secondary | ICD-10-CM | POA: Diagnosis not present

## 2022-05-20 DIAGNOSIS — M9901 Segmental and somatic dysfunction of cervical region: Secondary | ICD-10-CM | POA: Diagnosis not present

## 2022-05-20 DIAGNOSIS — M9902 Segmental and somatic dysfunction of thoracic region: Secondary | ICD-10-CM | POA: Diagnosis not present

## 2022-05-26 DIAGNOSIS — M9902 Segmental and somatic dysfunction of thoracic region: Secondary | ICD-10-CM | POA: Diagnosis not present

## 2022-05-26 DIAGNOSIS — M9903 Segmental and somatic dysfunction of lumbar region: Secondary | ICD-10-CM | POA: Diagnosis not present

## 2022-05-26 DIAGNOSIS — M62830 Muscle spasm of back: Secondary | ICD-10-CM | POA: Diagnosis not present

## 2022-05-26 DIAGNOSIS — M9901 Segmental and somatic dysfunction of cervical region: Secondary | ICD-10-CM | POA: Diagnosis not present

## 2022-06-03 DIAGNOSIS — M9901 Segmental and somatic dysfunction of cervical region: Secondary | ICD-10-CM | POA: Diagnosis not present

## 2022-06-03 DIAGNOSIS — M9902 Segmental and somatic dysfunction of thoracic region: Secondary | ICD-10-CM | POA: Diagnosis not present

## 2022-06-03 DIAGNOSIS — M62830 Muscle spasm of back: Secondary | ICD-10-CM | POA: Diagnosis not present

## 2022-06-03 DIAGNOSIS — M9903 Segmental and somatic dysfunction of lumbar region: Secondary | ICD-10-CM | POA: Diagnosis not present

## 2022-06-07 ENCOUNTER — Other Ambulatory Visit: Payer: Self-pay | Admitting: Family Medicine

## 2022-06-07 NOTE — Telephone Encounter (Signed)
Last office visit 01/07/22 for CPE.  Last refilled 01/25/22 for #30 with 3 refills.  No future appointments.

## 2022-06-14 DIAGNOSIS — M62830 Muscle spasm of back: Secondary | ICD-10-CM | POA: Diagnosis not present

## 2022-06-14 DIAGNOSIS — M9902 Segmental and somatic dysfunction of thoracic region: Secondary | ICD-10-CM | POA: Diagnosis not present

## 2022-06-14 DIAGNOSIS — M9903 Segmental and somatic dysfunction of lumbar region: Secondary | ICD-10-CM | POA: Diagnosis not present

## 2022-06-14 DIAGNOSIS — M9901 Segmental and somatic dysfunction of cervical region: Secondary | ICD-10-CM | POA: Diagnosis not present

## 2022-06-17 DIAGNOSIS — O09513 Supervision of elderly primigravida, third trimester: Secondary | ICD-10-CM | POA: Insufficient documentation

## 2022-06-17 DIAGNOSIS — N911 Secondary amenorrhea: Secondary | ICD-10-CM | POA: Diagnosis not present

## 2022-06-17 DIAGNOSIS — N912 Amenorrhea, unspecified: Secondary | ICD-10-CM | POA: Diagnosis not present

## 2022-07-08 DIAGNOSIS — O09511 Supervision of elderly primigravida, first trimester: Secondary | ICD-10-CM | POA: Diagnosis not present

## 2022-07-08 DIAGNOSIS — Z118 Encounter for screening for other infectious and parasitic diseases: Secondary | ICD-10-CM | POA: Diagnosis not present

## 2022-07-08 DIAGNOSIS — O10919 Unspecified pre-existing hypertension complicating pregnancy, unspecified trimester: Secondary | ICD-10-CM | POA: Diagnosis not present

## 2022-07-08 LAB — OB RESULTS CONSOLE VARICELLA ZOSTER ANTIBODY, IGG: Varicella: IMMUNE

## 2022-07-08 LAB — OB RESULTS CONSOLE HEPATITIS B SURFACE ANTIGEN: Hepatitis B Surface Ag: NEGATIVE

## 2022-07-08 LAB — OB RESULTS CONSOLE RUBELLA ANTIBODY, IGM: Rubella: NON-IMMUNE/NOT IMMUNE

## 2022-07-21 DIAGNOSIS — T1511XA Foreign body in conjunctival sac, right eye, initial encounter: Secondary | ICD-10-CM | POA: Diagnosis not present

## 2022-08-05 DIAGNOSIS — O10919 Unspecified pre-existing hypertension complicating pregnancy, unspecified trimester: Secondary | ICD-10-CM | POA: Diagnosis not present

## 2022-08-11 DIAGNOSIS — M9902 Segmental and somatic dysfunction of thoracic region: Secondary | ICD-10-CM | POA: Diagnosis not present

## 2022-08-11 DIAGNOSIS — M9903 Segmental and somatic dysfunction of lumbar region: Secondary | ICD-10-CM | POA: Diagnosis not present

## 2022-08-11 DIAGNOSIS — M9901 Segmental and somatic dysfunction of cervical region: Secondary | ICD-10-CM | POA: Diagnosis not present

## 2022-08-11 DIAGNOSIS — M62830 Muscle spasm of back: Secondary | ICD-10-CM | POA: Diagnosis not present

## 2022-08-25 DIAGNOSIS — M9902 Segmental and somatic dysfunction of thoracic region: Secondary | ICD-10-CM | POA: Diagnosis not present

## 2022-08-25 DIAGNOSIS — M62830 Muscle spasm of back: Secondary | ICD-10-CM | POA: Diagnosis not present

## 2022-08-25 DIAGNOSIS — M9903 Segmental and somatic dysfunction of lumbar region: Secondary | ICD-10-CM | POA: Diagnosis not present

## 2022-08-25 DIAGNOSIS — M9901 Segmental and somatic dysfunction of cervical region: Secondary | ICD-10-CM | POA: Diagnosis not present

## 2022-09-05 DIAGNOSIS — O09512 Supervision of elderly primigravida, second trimester: Secondary | ICD-10-CM | POA: Diagnosis not present

## 2022-09-08 DIAGNOSIS — M9902 Segmental and somatic dysfunction of thoracic region: Secondary | ICD-10-CM | POA: Diagnosis not present

## 2022-09-08 DIAGNOSIS — M9901 Segmental and somatic dysfunction of cervical region: Secondary | ICD-10-CM | POA: Diagnosis not present

## 2022-09-08 DIAGNOSIS — M9903 Segmental and somatic dysfunction of lumbar region: Secondary | ICD-10-CM | POA: Diagnosis not present

## 2022-09-08 DIAGNOSIS — M62830 Muscle spasm of back: Secondary | ICD-10-CM | POA: Diagnosis not present

## 2022-09-15 DIAGNOSIS — Z3A21 21 weeks gestation of pregnancy: Secondary | ICD-10-CM | POA: Diagnosis not present

## 2022-09-15 DIAGNOSIS — G43119 Migraine with aura, intractable, without status migrainosus: Secondary | ICD-10-CM | POA: Diagnosis not present

## 2022-09-15 DIAGNOSIS — M5481 Occipital neuralgia: Secondary | ICD-10-CM | POA: Diagnosis not present

## 2022-09-19 DIAGNOSIS — M9902 Segmental and somatic dysfunction of thoracic region: Secondary | ICD-10-CM | POA: Diagnosis not present

## 2022-09-19 DIAGNOSIS — M9903 Segmental and somatic dysfunction of lumbar region: Secondary | ICD-10-CM | POA: Diagnosis not present

## 2022-09-19 DIAGNOSIS — M62830 Muscle spasm of back: Secondary | ICD-10-CM | POA: Diagnosis not present

## 2022-09-19 DIAGNOSIS — M9901 Segmental and somatic dysfunction of cervical region: Secondary | ICD-10-CM | POA: Diagnosis not present

## 2022-10-04 DIAGNOSIS — M9903 Segmental and somatic dysfunction of lumbar region: Secondary | ICD-10-CM | POA: Diagnosis not present

## 2022-10-04 DIAGNOSIS — M62830 Muscle spasm of back: Secondary | ICD-10-CM | POA: Diagnosis not present

## 2022-10-04 DIAGNOSIS — M9901 Segmental and somatic dysfunction of cervical region: Secondary | ICD-10-CM | POA: Diagnosis not present

## 2022-10-04 DIAGNOSIS — M9902 Segmental and somatic dysfunction of thoracic region: Secondary | ICD-10-CM | POA: Diagnosis not present

## 2022-10-18 DIAGNOSIS — M9902 Segmental and somatic dysfunction of thoracic region: Secondary | ICD-10-CM | POA: Diagnosis not present

## 2022-10-18 DIAGNOSIS — M9903 Segmental and somatic dysfunction of lumbar region: Secondary | ICD-10-CM | POA: Diagnosis not present

## 2022-10-18 DIAGNOSIS — M62830 Muscle spasm of back: Secondary | ICD-10-CM | POA: Diagnosis not present

## 2022-10-18 DIAGNOSIS — M9901 Segmental and somatic dysfunction of cervical region: Secondary | ICD-10-CM | POA: Diagnosis not present

## 2022-10-31 DIAGNOSIS — M62830 Muscle spasm of back: Secondary | ICD-10-CM | POA: Diagnosis not present

## 2022-10-31 DIAGNOSIS — M9903 Segmental and somatic dysfunction of lumbar region: Secondary | ICD-10-CM | POA: Diagnosis not present

## 2022-10-31 DIAGNOSIS — M9902 Segmental and somatic dysfunction of thoracic region: Secondary | ICD-10-CM | POA: Diagnosis not present

## 2022-10-31 DIAGNOSIS — M9901 Segmental and somatic dysfunction of cervical region: Secondary | ICD-10-CM | POA: Diagnosis not present

## 2022-11-01 DIAGNOSIS — O0992 Supervision of high risk pregnancy, unspecified, second trimester: Secondary | ICD-10-CM | POA: Diagnosis not present

## 2022-11-09 DIAGNOSIS — R7309 Other abnormal glucose: Secondary | ICD-10-CM | POA: Diagnosis not present

## 2022-11-17 DIAGNOSIS — M9902 Segmental and somatic dysfunction of thoracic region: Secondary | ICD-10-CM | POA: Diagnosis not present

## 2022-11-17 DIAGNOSIS — M9903 Segmental and somatic dysfunction of lumbar region: Secondary | ICD-10-CM | POA: Diagnosis not present

## 2022-11-17 DIAGNOSIS — M9901 Segmental and somatic dysfunction of cervical region: Secondary | ICD-10-CM | POA: Diagnosis not present

## 2022-11-17 DIAGNOSIS — M62830 Muscle spasm of back: Secondary | ICD-10-CM | POA: Diagnosis not present

## 2022-11-22 DIAGNOSIS — O10919 Unspecified pre-existing hypertension complicating pregnancy, unspecified trimester: Secondary | ICD-10-CM | POA: Diagnosis not present

## 2022-11-30 DIAGNOSIS — M62830 Muscle spasm of back: Secondary | ICD-10-CM | POA: Diagnosis not present

## 2022-11-30 DIAGNOSIS — M9902 Segmental and somatic dysfunction of thoracic region: Secondary | ICD-10-CM | POA: Diagnosis not present

## 2022-11-30 DIAGNOSIS — M9903 Segmental and somatic dysfunction of lumbar region: Secondary | ICD-10-CM | POA: Diagnosis not present

## 2022-11-30 DIAGNOSIS — M9901 Segmental and somatic dysfunction of cervical region: Secondary | ICD-10-CM | POA: Diagnosis not present

## 2022-12-07 DIAGNOSIS — Z23 Encounter for immunization: Secondary | ICD-10-CM | POA: Diagnosis not present

## 2022-12-10 ENCOUNTER — Other Ambulatory Visit: Payer: Self-pay | Admitting: Family Medicine

## 2022-12-11 NOTE — Telephone Encounter (Signed)
Last office visit 01/07/22 for CPE.  Last refilled 06/07/22 for #90 with 1 refill.  No future appointments with PCP.  Patient is currently pregnant.

## 2022-12-14 DIAGNOSIS — M9901 Segmental and somatic dysfunction of cervical region: Secondary | ICD-10-CM | POA: Diagnosis not present

## 2022-12-14 DIAGNOSIS — M9903 Segmental and somatic dysfunction of lumbar region: Secondary | ICD-10-CM | POA: Diagnosis not present

## 2022-12-14 DIAGNOSIS — M9902 Segmental and somatic dysfunction of thoracic region: Secondary | ICD-10-CM | POA: Diagnosis not present

## 2022-12-14 DIAGNOSIS — M62830 Muscle spasm of back: Secondary | ICD-10-CM | POA: Diagnosis not present

## 2022-12-22 DIAGNOSIS — O10919 Unspecified pre-existing hypertension complicating pregnancy, unspecified trimester: Secondary | ICD-10-CM | POA: Diagnosis not present

## 2022-12-27 DIAGNOSIS — M9903 Segmental and somatic dysfunction of lumbar region: Secondary | ICD-10-CM | POA: Diagnosis not present

## 2022-12-27 DIAGNOSIS — M9902 Segmental and somatic dysfunction of thoracic region: Secondary | ICD-10-CM | POA: Diagnosis not present

## 2022-12-27 DIAGNOSIS — M62830 Muscle spasm of back: Secondary | ICD-10-CM | POA: Diagnosis not present

## 2022-12-27 DIAGNOSIS — M9901 Segmental and somatic dysfunction of cervical region: Secondary | ICD-10-CM | POA: Diagnosis not present

## 2022-12-29 DIAGNOSIS — Z2911 Encounter for prophylactic immunotherapy for respiratory syncytial virus (RSV): Secondary | ICD-10-CM | POA: Diagnosis not present

## 2022-12-29 DIAGNOSIS — Z3A36 36 weeks gestation of pregnancy: Secondary | ICD-10-CM | POA: Diagnosis not present

## 2022-12-29 DIAGNOSIS — O09513 Supervision of elderly primigravida, third trimester: Secondary | ICD-10-CM | POA: Diagnosis not present

## 2022-12-29 DIAGNOSIS — Z114 Encounter for screening for human immunodeficiency virus [HIV]: Secondary | ICD-10-CM | POA: Diagnosis not present

## 2022-12-29 LAB — OB RESULTS CONSOLE HIV ANTIBODY (ROUTINE TESTING): HIV: NONREACTIVE

## 2022-12-29 LAB — OB RESULTS CONSOLE GBS: GBS: NEGATIVE

## 2022-12-29 LAB — OB RESULTS CONSOLE GC/CHLAMYDIA
Chlamydia: NEGATIVE
Neisseria Gonorrhea: NEGATIVE

## 2022-12-29 LAB — OB RESULTS CONSOLE RPR: RPR: NONREACTIVE

## 2023-01-11 DIAGNOSIS — M9901 Segmental and somatic dysfunction of cervical region: Secondary | ICD-10-CM | POA: Diagnosis not present

## 2023-01-11 DIAGNOSIS — M9903 Segmental and somatic dysfunction of lumbar region: Secondary | ICD-10-CM | POA: Diagnosis not present

## 2023-01-11 DIAGNOSIS — M62830 Muscle spasm of back: Secondary | ICD-10-CM | POA: Diagnosis not present

## 2023-01-11 DIAGNOSIS — M9902 Segmental and somatic dysfunction of thoracic region: Secondary | ICD-10-CM | POA: Diagnosis not present

## 2023-01-18 ENCOUNTER — Encounter: Payer: Self-pay | Admitting: Obstetrics and Gynecology

## 2023-01-18 ENCOUNTER — Observation Stay
Admission: EM | Admit: 2023-01-18 | Discharge: 2023-01-18 | Disposition: A | Discharge status: Home / Self Care | Payer: BC Managed Care – PPO | Attending: Obstetrics and Gynecology | Admitting: Obstetrics and Gynecology

## 2023-01-18 DIAGNOSIS — R030 Elevated blood-pressure reading, without diagnosis of hypertension: Secondary | ICD-10-CM | POA: Diagnosis not present

## 2023-01-18 DIAGNOSIS — Z3A39 39 weeks gestation of pregnancy: Secondary | ICD-10-CM | POA: Insufficient documentation

## 2023-01-18 DIAGNOSIS — O26893 Other specified pregnancy related conditions, third trimester: Secondary | ICD-10-CM | POA: Diagnosis not present

## 2023-01-18 DIAGNOSIS — I10 Essential (primary) hypertension: Secondary | ICD-10-CM | POA: Diagnosis not present

## 2023-01-18 DIAGNOSIS — O163 Unspecified maternal hypertension, third trimester: Secondary | ICD-10-CM | POA: Diagnosis present

## 2023-01-18 DIAGNOSIS — O09513 Supervision of elderly primigravida, third trimester: Secondary | ICD-10-CM | POA: Diagnosis not present

## 2023-01-18 LAB — CBC
HCT: 35.5 % — ABNORMAL LOW (ref 36.0–46.0)
Hemoglobin: 12.2 g/dL (ref 12.0–15.0)
MCH: 31.9 pg (ref 26.0–34.0)
MCHC: 34.4 g/dL (ref 30.0–36.0)
MCV: 92.7 fL (ref 80.0–100.0)
Platelets: 159 10*3/uL (ref 150–400)
RBC: 3.83 MIL/uL — ABNORMAL LOW (ref 3.87–5.11)
RDW: 13.9 % (ref 11.5–15.5)
WBC: 13.9 10*3/uL — ABNORMAL HIGH (ref 4.0–10.5)
nRBC: 0 % (ref 0.0–0.2)

## 2023-01-18 LAB — COMPREHENSIVE METABOLIC PANEL
ALT: 13 U/L (ref 0–44)
AST: 22 U/L (ref 15–41)
Albumin: 3.5 g/dL (ref 3.5–5.0)
Alkaline Phosphatase: 129 U/L — ABNORMAL HIGH (ref 38–126)
Anion gap: 9 (ref 5–15)
BUN: 11 mg/dL (ref 6–20)
CO2: 21 mmol/L — ABNORMAL LOW (ref 22–32)
Calcium: 11.2 mg/dL — ABNORMAL HIGH (ref 8.9–10.3)
Chloride: 106 mmol/L (ref 98–111)
Creatinine, Ser: 0.74 mg/dL (ref 0.44–1.00)
GFR, Estimated: >60 mL/min (ref >60)
Glucose, Bld: 79 mg/dL (ref 70–99)
Potassium: 3.9 mmol/L (ref 3.5–5.1)
Sodium: 136 mmol/L (ref 135–145)
Total Bilirubin: 0.8 mg/dL (ref 0.3–1.2)
Total Protein: 6.4 g/dL — ABNORMAL LOW (ref 6.5–8.1)

## 2023-01-18 LAB — PROTEIN / CREATININE RATIO, URINE
Creatinine, Urine: 44 mg/dL
Total Protein, Urine: <6 mg/dL

## 2023-01-18 NOTE — Discharge Summary (Signed)
Patient ID: Carly Suarez MRN: [id number] DOB/AGE: [birth year] 37 y.o.  Admit date: 01/18/2023 Discharge date: 01/18/2023  Admission Diagnoses: 37yo G1P0 at 52w3dsent to triage from the office for elevated BPs, 130/90.  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   Prenatal Procedures: NST  Consults: None  Significant Diagnostic Studies:  Results for orders placed or performed during the hospital encounter of 01/18/23 (from the past 168 hour(s))  Protein / creatinine ratio, urine   Collection Time: 01/18/23  5:30 PM  Result Value Ref Range   Creatinine, Urine 44 mg/dL   Total Protein, Urine <6 mg/dL   Protein Creatinine Ratio        0.00 - 0.15 mg/mg[Cre]  CBC   Collection Time: 01/18/23  5:40 PM  Result Value Ref Range   WBC 13.9 (H) 4.0 - 10.5 K/uL   RBC 3.83 (L) 3.87 - 5.11 MIL/uL   Hemoglobin 12.2 12.0 - 15.0 g/dL   HCT 35.5 (L) 36.0 - 46.0 %   MCV 92.7 80.0 - 100.0 fL   MCH 31.9 26.0 - 34.0 pg   MCHC 34.4 30.0 - 36.0 g/dL   RDW 13.9 11.5 - 15.5 %   Platelets 159 150 - 400 K/uL   nRBC 0.0 0.0 - 0.2 %  Comprehensive metabolic panel   Collection Time: 01/18/23  5:40 PM  Result Value Ref Range   Sodium 136 135 - 145 mmol/L   Potassium 3.9 3.5 - 5.1 mmol/L   Chloride 106 98 - 111 mmol/L   CO2 21 (L) 22 - 32 mmol/L   Glucose, Bld 79 70 - 99 mg/dL   BUN 11 6 - 20 mg/dL   Creatinine, Ser 0.74 0.44 - 1.00 mg/dL   Calcium 11.2 (H) 8.9 - 10.3 mg/dL   Total Protein 6.4 (L) 6.5 - 8.1 g/dL   Albumin 3.5 3.5 - 5.0 g/dL   AST 22 15 - 41 U/L   ALT 13 0 - 44 U/L   Alkaline Phosphatase 129 (H) 38 - 126 U/L   Total Bilirubin 0.8 0.3 - 1.2 mg/dL   GFR, Estimated >60 >60 mL/min   Anion gap 9 5 - 15    Treatments: none  Hospital Course:  This is a 37y.o. G1P0 with IUP at 384w3deen for elevated BP.    NST reactive, labs  noted above Vitals:   01/18/23 1700 01/18/23 1715 01/18/23 1731 01/18/23 1745  BP: 136/73 (!) 160/80 138/80 135/76   01/18/23 1800 01/18/23 1815  BP: 132/80 134/82    C/w Dr. JaGlennon Macshe may go home with close follow up.  She was observed, fetal heart rate monitoring remained reassuring, and she had no signs/symptoms of  labor or other maternal-fetal concerns.  She was deemed stable for discharge to home with outpatient follow up.  Discharge Physical Exam:  BP 134/82   Pulse 75   Resp 16   Ht '5\' 3"'$  (1.6 m)   Wt 84.8 kg   BMI 33.13 kg/m   General: NAD CV: RRR Pulm: nl effort ABD: s/nd/nt, gravid DVT Evaluation: LE non-ttp, no evidence of DVT on exam.  NST: FHR baseline: 130 bpm Variability: moderate Accelerations: yes Decelerations: none Category/reactivity: reactive   TOCO: quiet SVE: deferred      Discharge Condition: Stable  Disposition: Discharge disposition: 01-Home or Self Care  Allergies as of 01/18/2023   No Known Allergies      Medication List     STOP taking these medications    baclofen 10 MG tablet Commonly known as: LIORESAL       TAKE these medications    valACYclovir 1000 MG tablet Commonly known as: VALTREX TAKE 1 TABLET (1,000 MG TOTAL) BY MOUTH DAILY AS NEEDED.        Follow-up [facility] Follow up.   Why: Schedulers will call you to make an appt on Monday Contact information: [place] [phone]                Signed:  Regina Eck 01/18/2023 6:40 PM

## 2023-01-24 ENCOUNTER — Other Ambulatory Visit: Payer: Self-pay | Admitting: Obstetrics

## 2023-01-24 DIAGNOSIS — Z3490 Encounter for supervision of normal pregnancy, unspecified, unspecified trimester: Secondary | ICD-10-CM

## 2023-01-24 NOTE — Progress Notes (Signed)
G1P0 at [redacted]w[redacted]d LMP of 01/22/23, c/w early [place] 832w5d Scheduled for induction of labor for AMA and CHTN on 01/25/23.   [facility] Pregnancy complicated by: Maternal Age >3>74o Age at delivery:  [age]. 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 History of Abnormal Pap smear  2022 Negative HPV Positive 06/2022 NILM, HPV Neg  7. Elevated 1hr GTT  11/01/22: 154, 3hr GTT ordered  11/11/22: 3hr passed - 91, 145, 151, 104 8. Rubella Non Immune  Prenatal Labs: Blood type/Rh O Pos  Antibody screen neg  Rubella Immune    Varicella Immune  RPR NR  HBsAg   Neg  Hep C NR  HIV   NR  GC neg  Chlamydia neg  Genetic screening cfDNA negative  1 hour GTT 154  3 hour GTT 91, 145, 151, 104  GBS   Neg   Tdap: 12/07/22 Flu: declined RSV at 32-36 weeks - given 12/29/22 Contraception:  TBD Feeding preference:  TBD  ____ FeAvelino LeedsCNM Certified Nurse Midwife [facility]

## 2023-01-25 ENCOUNTER — Other Ambulatory Visit: Payer: Self-pay

## 2023-01-25 ENCOUNTER — Encounter
Admission: EM | Disposition: A | Discharge status: Home / Self Care | Payer: Self-pay | Source: Home / Self Care | Attending: Certified Nurse Midwife

## 2023-01-25 ENCOUNTER — Encounter: Payer: Self-pay | Admitting: Obstetrics and Gynecology

## 2023-01-25 ENCOUNTER — Inpatient Hospital Stay: Payer: BC Managed Care – PPO | Admitting: Anesthesiology

## 2023-01-25 ENCOUNTER — Inpatient Hospital Stay
Admission: EM | Admit: 2023-01-25 | Discharge: 2023-01-27 | DRG: 787 | Disposition: A | Discharge status: Home / Self Care | Payer: BC Managed Care – PPO | Attending: Obstetrics | Admitting: Obstetrics

## 2023-01-25 DIAGNOSIS — Z148 Genetic carrier of other disease: Secondary | ICD-10-CM

## 2023-01-25 DIAGNOSIS — Z3A40 40 weeks gestation of pregnancy: Secondary | ICD-10-CM

## 2023-01-25 DIAGNOSIS — D62 Acute posthemorrhagic anemia: Secondary | ICD-10-CM | POA: Diagnosis not present

## 2023-01-25 DIAGNOSIS — N942 Vaginismus: Secondary | ICD-10-CM | POA: Diagnosis present

## 2023-01-25 DIAGNOSIS — O9902 Anemia complicating childbirth: Secondary | ICD-10-CM | POA: Diagnosis present

## 2023-01-25 DIAGNOSIS — O139 Gestational [pregnancy-induced] hypertension without significant proteinuria, unspecified trimester: Secondary | ICD-10-CM | POA: Diagnosis not present

## 2023-01-25 DIAGNOSIS — A6000 Herpesviral infection of urogenital system, unspecified: Secondary | ICD-10-CM | POA: Diagnosis not present

## 2023-01-25 DIAGNOSIS — O480 Post-term pregnancy: Secondary | ICD-10-CM | POA: Diagnosis not present

## 2023-01-25 DIAGNOSIS — O9832 Other infections with a predominantly sexual mode of transmission complicating childbirth: Secondary | ICD-10-CM | POA: Diagnosis present

## 2023-01-25 DIAGNOSIS — O1002 Pre-existing essential hypertension complicating childbirth: Principal | ICD-10-CM | POA: Diagnosis present

## 2023-01-25 DIAGNOSIS — O3463 Maternal care for abnormality of vagina, third trimester: Secondary | ICD-10-CM | POA: Diagnosis present

## 2023-01-25 DIAGNOSIS — O09513 Supervision of elderly primigravida, third trimester: Secondary | ICD-10-CM | POA: Diagnosis not present

## 2023-01-25 DIAGNOSIS — Z23 Encounter for immunization: Secondary | ICD-10-CM | POA: Diagnosis not present

## 2023-01-25 DIAGNOSIS — Z3490 Encounter for supervision of normal pregnancy, unspecified, unspecified trimester: Principal | ICD-10-CM

## 2023-01-25 DIAGNOSIS — O9081 Anemia of the puerperium: Secondary | ICD-10-CM | POA: Diagnosis not present

## 2023-01-25 DIAGNOSIS — O1003 Pre-existing essential hypertension complicating the puerperium: Secondary | ICD-10-CM | POA: Diagnosis not present

## 2023-01-25 DIAGNOSIS — O1092 Unspecified pre-existing hypertension complicating childbirth: Secondary | ICD-10-CM | POA: Diagnosis not present

## 2023-01-25 HISTORY — DX: Essential (primary) hypertension: I10

## 2023-01-25 LAB — COMPREHENSIVE METABOLIC PANEL
ALT: 13 U/L (ref 0–44)
AST: 25 U/L (ref 15–41)
Albumin: 3.5 g/dL (ref 3.5–5.0)
Alkaline Phosphatase: 137 U/L — ABNORMAL HIGH (ref 38–126)
Anion gap: 10 (ref 5–15)
BUN: 14 mg/dL (ref 6–20)
CO2: 19 mmol/L — ABNORMAL LOW (ref 22–32)
Calcium: 10.5 mg/dL — ABNORMAL HIGH (ref 8.9–10.3)
Chloride: 107 mmol/L (ref 98–111)
Creatinine, Ser: 0.69 mg/dL (ref 0.44–1.00)
GFR, Estimated: >60 mL/min (ref >60)
Glucose, Bld: 100 mg/dL — ABNORMAL HIGH (ref 70–99)
Potassium: 3.4 mmol/L — ABNORMAL LOW (ref 3.5–5.1)
Sodium: 136 mmol/L (ref 135–145)
Total Bilirubin: 0.5 mg/dL (ref 0.3–1.2)
Total Protein: 6.8 g/dL (ref 6.5–8.1)

## 2023-01-25 LAB — TYPE AND SCREEN
ABO/RH(D): O POS
Antibody Screen: NEGATIVE

## 2023-01-25 LAB — PROTEIN / CREATININE RATIO, URINE
Creatinine, Urine: 46 mg/dL
Total Protein, Urine: <6 mg/dL

## 2023-01-25 LAB — CBC
HCT: 35.9 % — ABNORMAL LOW (ref 36.0–46.0)
Hemoglobin: 12.6 g/dL (ref 12.0–15.0)
MCH: 32.3 pg (ref 26.0–34.0)
MCHC: 35.1 g/dL (ref 30.0–36.0)
MCV: 92.1 fL (ref 80.0–100.0)
Platelets: 176 10*3/uL (ref 150–400)
RBC: 3.9 MIL/uL (ref 3.87–5.11)
RDW: 13.7 % (ref 11.5–15.5)
WBC: 14.3 10*3/uL — ABNORMAL HIGH (ref 4.0–10.5)
nRBC: 0 % (ref 0.0–0.2)

## 2023-01-25 LAB — RPR: RPR Ser Ql: NONREACTIVE

## 2023-01-25 LAB — ABO/RH: ABO/RH(D): O POS

## 2023-01-25 SURGERY — Surgical Case
Anesthesia: Spinal

## 2023-01-25 MED ORDER — OXYTOCIN BOLUS FROM INFUSION: 333.0000 mL | Freq: Once | INTRAVENOUS | Status: DC

## 2023-01-25 MED ORDER — ONDANSETRON HCL 4 MG/2ML IJ SOLN: 4.0000 mg | Freq: Three times a day (TID) | INTRAMUSCULAR | Status: DC | PRN

## 2023-01-25 MED ORDER — LIDOCAINE HCL (PF) 1 % IJ SOLN
INTRAMUSCULAR | Status: AC
  Filled 2023-01-25: qty 30

## 2023-01-25 MED ORDER — FLEET ENEMA 7-19 GM/118ML RE ENEM: 1.0000 | ENEMA | Freq: Every day | RECTAL | Status: DC | PRN

## 2023-01-25 MED ORDER — KETOROLAC TROMETHAMINE 30 MG/ML IJ SOLN: 30.0000 mg | Freq: Four times a day (QID) | INTRAMUSCULAR | Status: AC | PRN

## 2023-01-25 MED ORDER — BUPIVACAINE HCL (PF) 0.5 % IJ SOLN
60.0000 mL | Freq: Once | INTRAMUSCULAR | Status: DC
  Filled 2023-01-25: qty 60

## 2023-01-25 MED ORDER — DIPHENHYDRAMINE HCL 50 MG/ML IJ SOLN: 12.5000 mg | INTRAMUSCULAR | Status: DC | PRN

## 2023-01-25 MED ORDER — TETANUS-DIPHTH-ACELL PERTUSSIS 5-2.5-18.5 LF-MCG/0.5 IM SUSY: 0.5000 mL | PREFILLED_SYRINGE | Freq: Once | INTRAMUSCULAR | Status: DC

## 2023-01-25 MED ORDER — MISOPROSTOL 25 MCG QUARTER TABLET
25.0000 ug | ORAL_TABLET | ORAL | Status: DC | PRN
  Filled 2023-01-25: qty 1

## 2023-01-25 MED ORDER — SCOPOLAMINE 1 MG/3DAYS TD PT72: 1.0000 | MEDICATED_PATCH | Freq: Once | TRANSDERMAL | Status: DC

## 2023-01-25 MED ORDER — GABAPENTIN 300 MG PO CAPS
300.0000 mg | ORAL_CAPSULE | ORAL | Status: AC
  Administered 2023-01-25: 300 mg via ORAL
  Filled 2023-01-25: qty 1

## 2023-01-25 MED ORDER — ONDANSETRON HCL 4 MG/2ML IJ SOLN: 4.0000 mg | Freq: Four times a day (QID) | INTRAMUSCULAR | Status: DC | PRN

## 2023-01-25 MED ORDER — KETOROLAC TROMETHAMINE 30 MG/ML IJ SOLN
30.0000 mg | Freq: Four times a day (QID) | INTRAMUSCULAR | Status: AC | PRN
  Administered 2023-01-25 (×2): 30 mg via INTRAVENOUS

## 2023-01-25 MED ORDER — MORPHINE SULFATE (PF) 0.5 MG/ML IJ SOLN
INTRAMUSCULAR | Status: AC
  Filled 2023-01-25: qty 10

## 2023-01-25 MED ORDER — FERROUS SULFATE 325 (65 FE) MG PO TABS
325.0000 mg | ORAL_TABLET | Freq: Two times a day (BID) | ORAL | Status: DC
  Administered 2023-01-25 – 2023-01-27 (×4): 325 mg via ORAL
  Filled 2023-01-25 (×4): qty 1

## 2023-01-25 MED ORDER — MEASLES, MUMPS & RUBELLA VAC IJ SOLR
0.5000 mL | Freq: Once | INTRAMUSCULAR | Status: DC
  Filled 2023-01-25: qty 0.5

## 2023-01-25 MED ORDER — OXYCODONE HCL 5 MG PO TABS
5.0000 mg | ORAL_TABLET | ORAL | Status: DC | PRN
  Administered 2023-01-27 (×2): 5 mg via ORAL
  Filled 2023-01-25 (×2): qty 1

## 2023-01-25 MED ORDER — DIPHENHYDRAMINE HCL 25 MG PO CAPS
25.0000 mg | ORAL_CAPSULE | Freq: Four times a day (QID) | ORAL | Status: DC | PRN
  Filled 2023-01-25: qty 1

## 2023-01-25 MED ORDER — ACETAMINOPHEN 500 MG PO TABS
1000.0000 mg | ORAL_TABLET | Freq: Four times a day (QID) | ORAL | Status: DC
  Administered 2023-01-25 – 2023-01-27 (×8): 1000 mg via ORAL
  Filled 2023-01-25 (×9): qty 2

## 2023-01-25 MED ORDER — SODIUM CHLORIDE 0.9% FLUSH: 3.0000 mL | INTRAVENOUS | Status: DC | PRN

## 2023-01-25 MED ORDER — BISACODYL 10 MG RE SUPP: 10.0000 mg | Freq: Every day | RECTAL | Status: DC | PRN

## 2023-01-25 MED ORDER — COCONUT OIL OIL: 1.0000 | TOPICAL_OIL | Status: DC | PRN

## 2023-01-25 MED ORDER — PHENYLEPHRINE HCL-NACL 20-0.9 MG/250ML-% IV SOLN
INTRAVENOUS | Status: DC | PRN
  Administered 2023-01-25: 40 ug/min via INTRAVENOUS

## 2023-01-25 MED ORDER — CEFAZOLIN SODIUM-DEXTROSE 2-4 GM/100ML-% IV SOLN
2.0000 g | INTRAVENOUS | Status: AC
  Administered 2023-01-25: 2 g via INTRAVENOUS
  Filled 2023-01-25: qty 100

## 2023-01-25 MED ORDER — ACETAMINOPHEN 500 MG PO TABS
1000.0000 mg | ORAL_TABLET | ORAL | Status: AC
  Administered 2023-01-25: 1000 mg via ORAL
  Filled 2023-01-25: qty 2

## 2023-01-25 MED ORDER — LIDOCAINE HCL (PF) 1 % IJ SOLN: 30.0000 mL | INTRAMUSCULAR | Status: DC | PRN

## 2023-01-25 MED ORDER — ACETAMINOPHEN 325 MG PO TABS: 650.0000 mg | ORAL_TABLET | ORAL | Status: DC | PRN

## 2023-01-25 MED ORDER — SOD CITRATE-CITRIC ACID 500-334 MG/5ML PO SOLN: 30.0000 mL | ORAL | Status: DC

## 2023-01-25 MED ORDER — KETOROLAC TROMETHAMINE 30 MG/ML IJ SOLN
INTRAMUSCULAR | Status: AC
  Filled 2023-01-25: qty 1

## 2023-01-25 MED ORDER — NALOXONE HCL 4 MG/10ML IJ SOLN
1.0000 ug/kg/h | INTRAVENOUS | Status: DC | PRN
  Filled 2023-01-25: qty 5

## 2023-01-25 MED ORDER — ZOLPIDEM TARTRATE 5 MG PO TABS
5.0000 mg | ORAL_TABLET | Freq: Every evening | ORAL | Status: DC | PRN
  Administered 2023-01-25: 5 mg via ORAL
  Filled 2023-01-25: qty 1

## 2023-01-25 MED ORDER — SODIUM CHLORIDE 0.9% FLUSH: 20.0000 mL | Freq: Once | INTRAVENOUS | Status: DC

## 2023-01-25 MED ORDER — MEPERIDINE HCL 25 MG/ML IJ SOLN: 6.2500 mg | INTRAMUSCULAR | Status: DC | PRN

## 2023-01-25 MED ORDER — OXYTOCIN-SODIUM CHLORIDE 30-0.9 UT/500ML-% IV SOLN
2.5000 [IU]/h | INTRAVENOUS | Status: DC
  Filled 2023-01-25: qty 500

## 2023-01-25 MED ORDER — OXYTOCIN 10 UNIT/ML IJ SOLN
INTRAMUSCULAR | Status: AC
  Filled 2023-01-25: qty 2

## 2023-01-25 MED ORDER — NALOXONE HCL 0.4 MG/ML IJ SOLN: 0.4000 mg | INTRAMUSCULAR | Status: DC | PRN

## 2023-01-25 MED ORDER — SOD CITRATE-CITRIC ACID 500-334 MG/5ML PO SOLN: 30.0000 mL | ORAL | Status: DC | PRN

## 2023-01-25 MED ORDER — CALCIUM CARBONATE ANTACID 500 MG PO CHEW: 400.0000 mg | CHEWABLE_TABLET | Freq: Once | ORAL | Status: AC

## 2023-01-25 MED ORDER — MISOPROSTOL 25 MCG QUARTER TABLET
25.0000 ug | ORAL_TABLET | ORAL | Status: DC
  Administered 2023-01-25: 25 ug via ORAL
  Filled 2023-01-25: qty 1

## 2023-01-25 MED ORDER — SIMETHICONE 80 MG PO CHEW: 80.0000 mg | CHEWABLE_TABLET | ORAL | Status: DC | PRN

## 2023-01-25 MED ORDER — LIDOCAINE HCL URETHRAL/MUCOSAL 2 % EX GEL
1.0000 | Freq: Once | CUTANEOUS | Status: AC
  Administered 2023-01-25: 1 via TOPICAL
  Filled 2023-01-25: qty 5

## 2023-01-25 MED ORDER — ONDANSETRON HCL 4 MG/2ML IJ SOLN
INTRAMUSCULAR | Status: DC | PRN
  Administered 2023-01-25: 4 mg via INTRAVENOUS

## 2023-01-25 MED ORDER — PHENYLEPHRINE 80 MCG/ML (10ML) SYRINGE FOR IV PUSH (FOR BLOOD PRESSURE SUPPORT)
PREFILLED_SYRINGE | INTRAVENOUS | Status: AC
  Filled 2023-01-25: qty 10

## 2023-01-25 MED ORDER — FENTANYL CITRATE (PF) 100 MCG/2ML IJ SOLN
50.0000 ug | INTRAMUSCULAR | Status: DC | PRN
  Administered 2023-01-25: 50 ug via INTRAVENOUS
  Filled 2023-01-25: qty 2

## 2023-01-25 MED ORDER — LACTATED RINGERS IV SOLN: INTRAVENOUS | Status: DC

## 2023-01-25 MED ORDER — OXYTOCIN-SODIUM CHLORIDE 30-0.9 UT/500ML-% IV SOLN: 2.5000 [IU]/h | INTRAVENOUS | Status: AC

## 2023-01-25 MED ORDER — LACTATED RINGERS IV SOLN
500.0000 mL | INTRAVENOUS | Status: DC | PRN
  Administered 2023-01-25 (×2): 500 mL via INTRAVENOUS

## 2023-01-25 MED ORDER — FENTANYL CITRATE (PF) 100 MCG/2ML IJ SOLN
INTRAMUSCULAR | Status: DC | PRN
  Administered 2023-01-25: 15 ug via INTRAVENOUS

## 2023-01-25 MED ORDER — CALCIUM CARBONATE ANTACID 500 MG PO CHEW
CHEWABLE_TABLET | ORAL | Status: AC
  Administered 2023-01-25: 400 mg via ORAL
  Filled 2023-01-25: qty 2

## 2023-01-25 MED ORDER — SENNOSIDES-DOCUSATE SODIUM 8.6-50 MG PO TABS
2.0000 | ORAL_TABLET | ORAL | Status: DC
  Administered 2023-01-25 – 2023-01-26 (×2): 2 via ORAL
  Filled 2023-01-25 (×2): qty 2

## 2023-01-25 MED ORDER — AMMONIA AROMATIC IN INHA
RESPIRATORY_TRACT | Status: AC
  Filled 2023-01-25: qty 10

## 2023-01-25 MED ORDER — TERBUTALINE SULFATE 1 MG/ML IJ SOLN
0.2500 mg | Freq: Once | INTRAMUSCULAR | Status: DC | PRN
  Filled 2023-01-25: qty 1

## 2023-01-25 MED ORDER — GABAPENTIN 300 MG PO CAPS
300.0000 mg | ORAL_CAPSULE | Freq: Every day | ORAL | Status: DC
  Administered 2023-01-25 – 2023-01-26 (×2): 300 mg via ORAL
  Filled 2023-01-25 (×2): qty 1

## 2023-01-25 MED ORDER — OXYTOCIN-SODIUM CHLORIDE 30-0.9 UT/500ML-% IV SOLN
INTRAVENOUS | Status: DC | PRN
  Administered 2023-01-25: 600 m[IU]/min via INTRAVENOUS

## 2023-01-25 MED ORDER — OXYCODONE HCL 5 MG PO TABS: 5.0000 mg | ORAL_TABLET | Freq: Four times a day (QID) | ORAL | Status: DC | PRN

## 2023-01-25 MED ORDER — BUPIVACAINE HCL (PF) 0.5 % IJ SOLN
INTRAMUSCULAR | Status: DC | PRN
  Administered 2023-01-25: 100 mL

## 2023-01-25 MED ORDER — MAGNESIUM OXIDE -MG SUPPLEMENT 400 (240 MG) MG PO TABS: 400.0000 mg | ORAL_TABLET | Freq: Two times a day (BID) | ORAL | Status: DC | PRN

## 2023-01-25 MED ORDER — KETOROLAC TROMETHAMINE 30 MG/ML IJ SOLN
30.0000 mg | Freq: Four times a day (QID) | INTRAMUSCULAR | Status: AC
  Administered 2023-01-26 (×2): 30 mg via INTRAVENOUS
  Filled 2023-01-25 (×4): qty 1

## 2023-01-25 MED ORDER — PRENATAL MULTIVITAMIN CH
1.0000 | ORAL_TABLET | Freq: Every day | ORAL | Status: DC
  Administered 2023-01-25 – 2023-01-27 (×3): 1 via ORAL
  Filled 2023-01-25 (×3): qty 1

## 2023-01-25 MED ORDER — MISOPROSTOL 200 MCG PO TABS
ORAL_TABLET | ORAL | Status: AC
  Administered 2023-01-25: 25 ug via VAGINAL
  Filled 2023-01-25: qty 4

## 2023-01-25 MED ORDER — PHENYLEPHRINE HCL-NACL 20-0.9 MG/250ML-% IV SOLN
INTRAVENOUS | Status: AC
  Filled 2023-01-25: qty 250

## 2023-01-25 MED ORDER — MENTHOL 3 MG MT LOZG: 1.0000 | LOZENGE | OROMUCOSAL | Status: DC | PRN

## 2023-01-25 MED ORDER — FENTANYL CITRATE (PF) 100 MCG/2ML IJ SOLN
INTRAMUSCULAR | Status: AC
  Filled 2023-01-25: qty 2

## 2023-01-25 MED ORDER — MORPHINE SULFATE (PF) 0.5 MG/ML IJ SOLN
INTRAMUSCULAR | Status: DC | PRN
  Administered 2023-01-25: .1 mg via EPIDURAL

## 2023-01-25 MED ORDER — SIMETHICONE 80 MG PO CHEW
80.0000 mg | CHEWABLE_TABLET | Freq: Three times a day (TID) | ORAL | Status: DC
  Administered 2023-01-25 – 2023-01-27 (×6): 80 mg via ORAL
  Filled 2023-01-25 (×6): qty 1

## 2023-01-25 MED ORDER — WITCH HAZEL-GLYCERIN EX PADS: 1.0000 | MEDICATED_PAD | CUTANEOUS | Status: DC | PRN

## 2023-01-25 MED ORDER — BUPIVACAINE IN DEXTROSE 0.75-8.25 % IT SOLN
INTRATHECAL | Status: DC | PRN
  Administered 2023-01-25: 1.5 mL via INTRATHECAL

## 2023-01-25 MED ORDER — DIBUCAINE (PERIANAL) 1 % EX OINT: 1.0000 | TOPICAL_OINTMENT | CUTANEOUS | Status: DC | PRN

## 2023-01-25 MED ORDER — IBUPROFEN 600 MG PO TABS
600.0000 mg | ORAL_TABLET | Freq: Four times a day (QID) | ORAL | Status: DC
  Administered 2023-01-26 – 2023-01-27 (×4): 600 mg via ORAL
  Filled 2023-01-25 (×4): qty 1

## 2023-01-25 MED ORDER — DIPHENHYDRAMINE HCL 25 MG PO CAPS
25.0000 mg | ORAL_CAPSULE | ORAL | Status: DC | PRN
  Administered 2023-01-26: 25 mg via ORAL

## 2023-01-25 MED ORDER — CALCIUM CARBONATE ANTACID 500 MG PO CHEW: 400.0000 mg | CHEWABLE_TABLET | Freq: Three times a day (TID) | ORAL | Status: DC | PRN

## 2023-01-25 MED ORDER — KETOROLAC TROMETHAMINE 30 MG/ML IJ SOLN
INTRAMUSCULAR | Status: DC | PRN
  Administered 2023-01-25: 30 mg via INTRAVENOUS

## 2023-01-25 SURGICAL SUPPLY — 31 items
APL PRP STRL LF DISP 70% ISPRP (MISCELLANEOUS) ×1
CHLORAPREP W/TINT 26 (MISCELLANEOUS) ×2 IMPLANT
DRSG TELFA 3X8 NADH STRL (GAUZE/BANDAGES/DRESSINGS) ×2 IMPLANT
DRSG TELFA 4X14 ISLAND NADH (GAUZE/BANDAGES/DRESSINGS) IMPLANT
ELECT REM PT RETURN 9FT ADLT (ELECTROSURGICAL) ×1
ELECTRODE REM PT RTRN 9FT ADLT (ELECTROSURGICAL) ×2 IMPLANT
GAUZE SPONGE 4X4 12PLY STRL (GAUZE/BANDAGES/DRESSINGS) ×2 IMPLANT
GOWN STRL REUS W/ TWL LRG LVL3 (GOWN DISPOSABLE) ×6 IMPLANT
GOWN STRL REUS W/TWL LRG LVL3 (GOWN DISPOSABLE) ×3
MANIFOLD NEPTUNE II (INSTRUMENTS) ×2 IMPLANT
MAT PREVALON FULL STRYKER (MISCELLANEOUS) ×2 IMPLANT
NDL HYPO 25GX1X1/2 BEV (NEEDLE) ×2 IMPLANT
NEEDLE HYPO 25GX1X1/2 BEV (NEEDLE) ×1 IMPLANT
NS IRRIG 1000ML POUR BTL (IV SOLUTION) ×2 IMPLANT
PACK C SECTION AR (MISCELLANEOUS) ×2 IMPLANT
PAD OB MATERNITY 4.3X12.25 (PERSONAL CARE ITEMS) ×2 IMPLANT
PAD PREP 24X41 OB/GYN DISP (PERSONAL CARE ITEMS) ×2 IMPLANT
SCRUB CHG 4% DYNA-HEX 4OZ (MISCELLANEOUS) ×2 IMPLANT
SPONGE GAUZE 4X4 STERILE 39 (GAUZE/BANDAGES/DRESSINGS) IMPLANT
SUT MNCRL 4-0 (SUTURE) ×1
SUT MNCRL 4-0 27XMFL (SUTURE) ×1
SUT VIC AB 0 CT1 36 (SUTURE) ×4 IMPLANT
SUT VIC AB 0 CTX 36 (SUTURE) ×2
SUT VIC AB 0 CTX36XBRD ANBCTRL (SUTURE) ×4 IMPLANT
SUT VIC AB 2-0 SH 27 (SUTURE) ×2
SUT VIC AB 2-0 SH 27XBRD (SUTURE) ×4 IMPLANT
SUTURE MNCRL 4-0 27XMF (SUTURE) ×2 IMPLANT
SYR 30ML LL (SYRINGE) ×4 IMPLANT
TAPE CLOTH 3 (GAUZE/BANDAGES/DRESSINGS) IMPLANT
TRAP FLUID SMOKE EVACUATOR (MISCELLANEOUS) ×2 IMPLANT
WATER STERILE IRR 500ML POUR (IV SOLUTION) ×2 IMPLANT

## 2023-01-25 NOTE — Progress Notes (Signed)
Labor Progress Note  Carly Suarez is a 37 y.o. G1P0 at 64w3dby LMP admitted for induction of labor due to chronic HTN, AMA, post-dates.  Subjective: notified by RN at [phone] recurrent late decelerations  Objective: BP (!) 145/96   Pulse 93   Temp 98.1 F (36.7 C) (Oral)   Ht '5\' 3"'$  (1.6 m)   Wt 84.8 kg   SpO2 (!) 86%   BMI 33.13 kg/m  Vitals:   01/25/23 0039 01/25/23 0055 01/25/23 0110 01/25/23 0125  BP: (!) 152/97 (!) 155/101 (!) 142/98 135/85   01/25/23 0140 01/25/23 0142 01/25/23 0155 01/25/23 0210  BP: (!) 125/99 132/85 137/89 131/78   01/25/23 0406 01/25/23 0639  BP: (!) 144/95 (!) 145/96    Notable VS details: reviewed  Fetal Assessment: FHT:  FHR: 145 bpm, variability: moderate,  accelerations:  Abscent,  decelerations:  Present recurrent late decelerations that resolved with IV fluid bolus Category/reactivity:  Category II UC:   regular, every 1-4 minutes SVE:  did not recheck at this time   Last cervical exam at 0400 fingertip/50/-1, posterior, soft Membrane status:intact Amniotic color: n/a  Labs: Lab Results  Component Value Date   WBC 14.3 (H) 01/25/2023   HGB 12.6 01/25/2023   HCT 35.9 (L) 01/25/2023   MCV 92.1 01/25/2023   PLT 176 01/25/2023    Assessment / Plan: 37year old G1P0 at 457w3dere with IOL for chronic HTN, AMA, and post-dates  Labor:  Received one dose cytotec at 0357, contracting frequently. Preeclampsia:  labs stable Fetal Wellbeing:  Category II, late decelerations that resolved with an IV fluid bolus. If late decelerations reoccur, will do terbutaline. Dr. BeLeafy Rond Dr. ScOuida Sillspdated.  Pain Control:   none currently I/D:   GBS negatve Anticipated MOD:  NSVD  DaGertie FeyCNM 01/25/2023, 7:37 AM

## 2023-01-25 NOTE — Anesthesia Preprocedure Evaluation (Signed)
Anesthesia Evaluation  Patient identified by MRN, date of birth, ID band Patient awake    Reviewed: Allergy & Precautions, NPO status , Patient's Chart, lab work & pertinent test results  History of Anesthesia Complications Negative for: history of anesthetic complications  Airway Mallampati: III  TM Distance: >3 FB Neck ROM: full    Dental no notable dental hx.    Pulmonary neg pulmonary ROS   Pulmonary exam normal        Cardiovascular Exercise Tolerance: Good hypertension, negative cardio ROS Normal cardiovascular exam     Neuro/Psych    GI/Hepatic negative GI ROS,,,  Endo/Other    Renal/GU   negative genitourinary   Musculoskeletal   Abdominal   Peds  Hematology negative hematology ROS (+)   Anesthesia Other Findings Past Medical History: No date: ADD (attention deficit disorder) No date: History of chicken pox No date: History of shingles No date: Hypertension  Past Surgical History: 06/30/2014: LEEP; N/A     Comment:  Procedure: LOOP ELECTROSURGICAL EXCISION PROCEDURE               (LEEP);  Surgeon: Lyman Speller, MD;  Location: [facility]               [facility];  Service: Gynecology;  Laterality: N/A; 2005: WISDOM TOOTH EXTRACTION  BMI    Body Mass Index: 33.13 kg/m      Reproductive/Obstetrics (+) Pregnancy                             Anesthesia Physical Anesthesia Plan  ASA: 2  Anesthesia Plan: Spinal   Post-op Pain Management:    Induction:   PONV Risk Score and Plan:   Airway Management Planned: Natural Airway and Nasal Cannula  Additional Equipment:   Intra-op Plan:   Post-operative Plan:   Informed Consent: I have reviewed the patients History and Physical, chart, labs and discussed the procedure including the risks, benefits and alternatives for the proposed anesthesia with the patient or authorized representative who has indicated his/her understanding  and acceptance.     Dental Advisory Given  Plan Discussed with: Anesthesiologist  Anesthesia Plan Comments: (Patient reports no bleeding problems and no anticoagulant use.  Plan for spinal with backup GA  Patient consented for risks of anesthesia including but not limited to:  - adverse reactions to medications - damage to eyes, teeth, lips or other oral mucosa - nerve damage due to positioning  - risk of bleeding, infection and or nerve damage from spinal that could lead to paralysis - risk of headache or failed spinal - damage to teeth, lips or other oral mucosa - sore throat or hoarseness - damage to heart, brain, nerves, lungs, other parts of body or loss of life  Patient voiced understanding.)        Anesthesia Quick Evaluation

## 2023-01-25 NOTE — H&P (Signed)
OB History & Physical   History of Present Illness:  Chief Complaint:   HPI:  Carly Suarez is a 37 y.o. G1P0 female at 74w3ddated by LMP.  She presents to L&D for   IOL for chronic HTN and post-dates.   Pregnancy Issues: 1. AMA  2. Anxiety/depression 3. 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 History of abnormal pap smear 7. Elevated 1hr GTT 8. Rubella non-immune 9. Possible vaginismus?  Maternal Medical History:   Past Medical History:  Diagnosis Date   ADD (attention deficit disorder)    History of chicken pox    History of shingles    Hypertension     Past Surgical History:  Procedure Laterality Date   LEEP N/A 06/30/2014   Procedure: LOOP ELECTROSURGICAL EXCISION PROCEDURE (LEEP);  Surgeon: MLyman Speller MD;  [facility];  Service: Gynecology;  Laterality: N/A;   WISDOM TOOTH EXTRACTION  2005    No Known Allergies  Prior to Admission medications   Medication Sig Start Date End Date Taking? Authorizing Provider  calcium carbonate (TUMS EX) 750 MG chewable tablet Chew 1 tablet by mouth daily.   Yes [provider]  valACYclovir (VALTREX) 1000 MG tablet TAKE 1 TABLET (1,000 MG TOTAL) BY MOUTH DAILY AS NEEDED. 12/12/22  Yes BJinny Sanders MD    [place] [facility] [place]  reports that she has never smoked. She has never used smokeless tobacco. She reports current alcohol use. She reports that she does not use drugs.  Family History: family history includes Arthritis in her maternal grandmother; Diabetes in her maternal grandmother; Heart disease in her paternal grandmother; Hypertension in her father, maternal grandmother, and mother; Melanoma (age of onset: 671 in her father; Other (age of onset: 364 in her brother; Prostate cancer in her maternal grandfather; Prostate cancer (age of onset: 638 in her father.   Review of Systems: A full review of systems was performed and  negative except as noted in the HPI.     Physical Exam:  Vital Signs: BP (!) 144/95   Pulse 92   Temp 98.1 F (36.7 C) (Oral)   Ht '5\' 3"'$  (1.6 m)   Wt 84.8 kg   SpO2 (!) 86%   BMI 33.13 kg/m  General: no acute distress.  HEENT: normocephalic, atraumatic Heart: regular rate & rhythm.  No murmurs/rubs/gallops Lungs: clear to auscultation bilaterally, normal respiratory effort Abdomen: soft, gravid, non-tender;  EFW: 7.5lb Pelvic:   External: Normal external female genitalia  Cervix: Dilation: Fingertip / Effacement (%): 50 / Station: -1    Extremities: non-tender, symmetric, mild edema bilaterally.  DTRs: +2  Neurologic: Alert & oriented x 3.    Results for orders placed or performed during the hospital encounter of 01/25/23 (from the past 24 hour(s))  CBC     Status: Abnormal   Collection Time: 01/25/23 12:44 AM  Result Value Ref Range   WBC 14.3 (H) 4.0 - 10.5 K/uL   RBC 3.90 3.87 - 5.11 MIL/uL   Hemoglobin 12.6 12.0 - 15.0 g/dL   HCT 35.9 (L) 36.0 - 46.0 %   MCV 92.1 80.0 - 100.0 fL   MCH 32.3 26.0 - 34.0 pg   MCHC 35.1 30.0 - 36.0 g/dL   RDW 13.7 11.5 - 15.5 %   Platelets 176 150 - 400 K/uL   nRBC 0.0 0.0 - 0.2 %  Type and screen     Status: None  Collection Time: 01/25/23 12:44 AM  Result Value Ref Range   ABO/RH(D) O POS    Antibody Screen NEG    Sample Expiration      01/28/2023,2359 Performed at [facility], [address] [place], [place] [address]   Protein / creatinine ratio, urine     Status: None   Collection Time: 01/25/23 12:44 AM  Result Value Ref Range   Creatinine, Urine 46 mg/dL   Total Protein, Urine <6 mg/dL   Protein Creatinine Ratio        0.00 - 0.15 mg/mg[Cre]  Comprehensive metabolic panel     Status: Abnormal   Collection Time: 01/25/23 12:44 AM  Result Value Ref Range   Sodium 136 135 - 145 mmol/L   Potassium 3.4 (L) 3.5 - 5.1 mmol/L   Chloride 107 98 - 111 mmol/L   CO2 19 (L) 22 - 32 mmol/L   Glucose, Bld 100 (H) 70 -  99 mg/dL   BUN 14 6 - 20 mg/dL   Creatinine, Ser 0.69 0.44 - 1.00 mg/dL   Calcium 10.5 (H) 8.9 - 10.3 mg/dL   Total Protein 6.8 6.5 - 8.1 g/dL   Albumin 3.5 3.5 - 5.0 g/dL   AST 25 15 - 41 U/L   ALT 13 0 - 44 U/L   Alkaline Phosphatase 137 (H) 38 - 126 U/L   Total Bilirubin 0.5 0.3 - 1.2 mg/dL   GFR, Estimated >60 >60 mL/min   Anion gap 10 5 - 15  ABO/Rh     Status: None   Collection Time: 01/25/23  2:50 AM  Result Value Ref Range   ABO/RH(D)      O POS Performed at [facility], [address] [place], [place] [address]     Pertinent Results:  Prenatal Labs: Blood type/Rh O pos  Antibody screen neg  Rubella Immune  Varicella Immune  RPR NR  HBsAg Neg  HIV NR  GC neg  Chlamydia neg  Genetic screening negative  1 hour GTT 154  3 hour GTT 91, 145, 151, 104  GBS Neg   FHT: 150bpm, moderate variability, accelerations present, no decelerations TOCO: contractions occasional, mild to palpation SVE:  Dilation: Fingertip / Effacement (%): 50 / Station: -1    Cephalic by leopolds  No results found.  Assessment:  Carly Suarez is a 36 y.o. G1P0 female at 15w3dwith IOL for chronic HTN.   Plan:  1. Admit to Labor & Delivery; consents reviewed and obtained  2. Fetal Well being  - Fetal Tracing: Category I tracing - Group B Streptococcus ppx indicated: n/a, GBS negative - Presentation: vertex confirmed by SVE   3. Routine OB: - Prenatal labs reviewed, as above - Rh positive - CBC, T&S, RPR on admit - Clear fluids, saline lock  4. Induction of Labor -  Contractions occasional, external toco in place -  Plan for induction with cytotec, pitocin, AROM, Cooke catheter -  Plan for continuous fetal monitoring  -  Maternal pain control as desired; undecided about pain control. Received IV fentanyl for cervical exam. - Anticipate vaginal delivery  5. Post Partum Planning: - Infant feeding: breastfeeding - Contraception: TBD - Tdap: 12/07/22 - Flu:  declined  DGertie Fey CNM 01/25/23 4:09 AM

## 2023-01-25 NOTE — Progress Notes (Signed)
37yo G1P0 at 40+3wks with planned iol for cHTN and term pregnancy. However, on administration of cytotec, FHT became concerning with recurrent lates and minimal variability. She is remote from delivery at 1 cm. Cooke cath just removed. Decision made to proceed with urgent cesarean section for non-reassuring fetal heart tones. She also has vaginismus.  The risks of cesarean section discussed with the patient included but were not limited to: bleeding which may require transfusion or reoperation; infection which may require antibiotics; injury to bowel, bladder, ureters or other surrounding organs; injury to the fetus; need for additional procedures including hysterectomy in the event of a life-threatening hemorrhage; placental abnormalities wth subsequent pregnancies, incisional problems, thromboembolic phenomenon and other postoperative/anesthesia complications. The patient concurred with the proposed plan, giving informed written consent for the procedure. Anesthesia and OR aware. Preoperative prophylactic antibiotics and SCDs ordered on call to the OR.  To OR when ready.

## 2023-01-25 NOTE — Transfer of Care (Signed)
Immediate Anesthesia Transfer of Care Note  Patient: Carly Suarez  Procedure(s) Performed: CESAREAN SECTION  Patient Location: PACU  Anesthesia Type:Spinal  Level of Consciousness: awake, alert , and oriented  Airway & Oxygen Therapy: Patient Spontanous Breathing  Post-op Assessment: Report given to RN and Post -op Vital signs reviewed and stable  Post vital signs: Reviewed and stable  Last Vitals:  Vitals Value Taken Time  BP 114/82 01/25/23 1129  Temp    Pulse    Resp 18 01/25/23 1129  SpO2 99 % 01/25/23 1129    Last Pain:  Vitals:   01/25/23 0405  TempSrc: Oral  PainSc:          Complications: No notable events documented.

## 2023-01-25 NOTE — Op Note (Addendum)
Cesarean Section Procedure Note  Date of procedure: 01/25/2023   Pre-operative Diagnosis: Intrauterine pregnancy at [redacted]w[redacted]d  - non reassuring fetal heart tones remote from delivery - chronic HTN  1. AMA  2. Anxiety/depression 3. 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 History of abnormal pap smear 7. Elevated 1hr GTT 8. Rubella non-immune 9. Vaginismus?   Post-operative Diagnosis: same, delivered.  Procedure: Primary Low Transverse Cesarean Section through Pfannenstiel incision  Surgeon: BBenjaman Kindler MD  Assistant(s):  RHassan Buckler CNM   Anesthesia: Spinal anesthesia  Anesthesiologist: OIlene Qua MD Anesthesiologist: OIlene Qua MD CRNA: WJonna Clark CRNA  Estimated Blood Loss:   485m        Drains: foley         Total IV Fluids: 80032mUrine Output: 300m88m      Specimens: cord blood for O+ mother         Complications:  None; patient tolerated the procedure well.         Disposition: PACU - hemodynamically stable.         Condition: stable  Findings:  A female infant "[place] cephalic  presentation. Amniotic fluid - Clear  Birth weight 3700 g.  Apgars of 6 and 9 at one and five minutes respectively.  Intact placenta with a three-vessel cord.  Grossly normal uterus, tubes and ovaries bilaterally. No intraabdominal adhesions were noted.  Indications:   36yo22yo0 at 40+3wks with planned iol for cHTN and term pregnancy. However, on administration of cytotec, FHT became concerning with recurrent lates and minimal variability. She is remote from delivery at 1 cm. Cooke cath just removed. Decision made to proceed with urgent cesarean section for non-reassuring fetal heart tones. She also has vaginismus.      Procedure Details  The patient was taken to Operating Room, identified as the correct patient and the procedure verified as C-Section Delivery. A formal Time Out was held with all team members present  and in agreement.  After induction of anesthesia, the patient was draped and prepped in the usual sterile manner. A Pfannenstiel skin incision was made and carried down through the subcutaneous tissue to the fascia. Fascial incision was made and extended transversely with the Mayo scissors. The fascia was separated from the underlying rectus tissue superiorly and inferiorly. The peritoneum was identified and entered bluntly. Peritoneal incision was extended longitudinally. The utero-vesical peritoneal reflection was incised transversely and a bladder flap was created digitally.   A low transverse hysterotomy was made. The fetus was delivered atraumatically. The umbilical cord was clamped x2 and cut and the infant was handed to the awaiting pediatricians. The placenta was removed intact and appeared normal, intact, and with a 3-vessel cord.   The uterus was exteriorized and cleared of all clot and debris. The hysterotomy was closed with running sutures of 0-Vicryl. A second imbricating layer was placed with the same suture. Excellent hemostasis was observed. The peritoneal cavity was cleared of all clots and debris. The uterus was returned to the abdomen.   Gutters and pelvis were evaluated and excellent hemostasis was noted. The fascia was then reapproximated with running sutures of 0 Maxon. The skin was reapproximated with Ensorb absorbable staples. 30 of 0.5% bupivicaine in 50ml82mNSS was placed in the fascial and skin lines.  Instrument, sponge, and needle counts were correct prior to the abdominal closure and at the conclusion of the case.   The patient tolerated  the procedure well and was transferred to the recovery room in stable condition.   Benjaman Kindler, MD 01/25/2023

## 2023-01-25 NOTE — Anesthesia Procedure Notes (Signed)
Spinal  Patient location during procedure: OR Start time: 01/25/2023 10:08 AM End time: 01/25/2023 10:15 AM Reason for block: surgical anesthesia Staffing Performed: resident/CRNA  Anesthesiologist: Ilene Qua, MD Resident/CRNA: Jonna Clark, CRNA Performed by: Jonna Clark, CRNA Authorized by: Ilene Qua, MD   Preanesthetic Checklist Completed: patient identified, IV checked, site marked, risks and benefits discussed, surgical consent, monitors and equipment checked, pre-op evaluation and timeout performed Spinal Block Patient position: sitting Prep: DuraPrep Patient monitoring: heart rate, cardiac monitor, continuous pulse ox and blood pressure Approach: midline Location: L3-4 Injection technique: single-shot Needle Needle type: Sprotte  Needle gauge: 24 G Needle length: 9 cm Assessment Sensory level: T4 Events: CSF return

## 2023-01-25 NOTE — Lactation Note (Signed)
This note was copied from a baby's chart. Lactation Consultation Note  Patient Name: Carly Suarez [id number] Date: 01/25/2023 Age:37 hours Reason for consult: Initial assessment;L&D Initial assessment;Primapara;1st time breastfeeding;Term;Other (Comment) (c-section)   Maternal Data Has patient been taught Hand Expression?: Yes Does the patient have breastfeeding experience prior to this delivery?: No  P1, c-section after NRFHT. Mom with hx of ADD, shingles and hypertension. Mom has bilateral nipple piercing's (which were removed yesterday), and what appears to be flat nipples that mom reports were at one time inverted. Nipple hard, inverts with compression/sandwiching of tissue despite nipple stimulation/rolling techniques.   Feeding Mother's Current Feeding Choice: Breast Milk  1150- LC at bedside in LDR5- Baby alert/awake and has hand in mouth- calm. LC removed 1 of the 2 blankets (initial temperature concerns), placed in football hold on L breast. Multiple attempts made to latch, but couldn't sustain latch.  Size 55m nipple shield placed. Baby on/off initially and then began to have bursts of consistent and rhythmic sucking patterns with 1-2 swallows identified. After about 20 minutes baby became sleepy and inconsistent.  12:30- LC back in room; RN called to assist with feeding, baby crying out. LC assisted with nipple shield placement and football hold/latch on the R breast. Baby easily accepted the shield and breast with more rhythmic sucking pattern for longer periods. After 7 minutes baby was sitting with breast/shield inside the mouth and would no longer suckle. Did become agitated when removed from the breast.   Mom extremely sleepy (side effect of medicine from c-section).  Encouraged support family to assist in comforting the baby.  LATCH Score Latch: Repeated attempts needed to sustain latch, nipple held in mouth throughout feeding, stimulation needed to elicit sucking  reflex.  Audible Swallowing: A few with stimulation  Type of Nipple: Flat  Comfort (Breast/Nipple): Soft / non-tender  Hold (Positioning): Assistance needed to correctly position infant at breast and maintain latch.  LATCH Score: 6   Lactation Tools Discussed/Used Tools: Nipple Shields Nipple shield size: 20 (flat/previously inverted nipples)  Interventions Interventions: Breast feeding basics reviewed;Assisted with latch;Hand express;Breast compression;Adjust position;Support pillows;Position options;Education (nipple shield, pumping first 2 weeks, additional holes from nipple piercings)  Reviewed early BF basics: feeding patterns and behaviors in first 24 hours. Encouraged attempts every 2-3 hours. Taught hand expression- encouraged to use this to prime the breasts prior to bringing baby to the breasts.  Discharge    Consult Status Consult Status: Follow-up from L&D    SLavonia Drafts3/04/2023, 12:18 PM

## 2023-01-25 NOTE — Progress Notes (Signed)
Labor Progress Note  Stassi Cardosi is a 37 y.o. G1P0 at 30w3dby LMP admitted [facility] induction of [facility] [place] [facility]  Subjective: no pain, but very anxious.     Objective: BP (!) 145/96   Pulse 93   Temp 98.1 F (36.7 C) (Oral)   Ht '5\' 3"'$  (1.6 m)   Wt 84.8 kg   SpO2 (!) 86%   BMI 33.13 kg/m  Notable VS details:   Vitals:   01/25/23 0039 01/25/23 0055 01/25/23 0110 01/25/23 0125  BP: (!) 152/97 (!) 155/101 (!) 142/98 135/85   01/25/23 0140 01/25/23 0142 01/25/23 0155 01/25/23 0210  BP: (!) 125/99 132/85 137/89 131/78   01/25/23 0406 01/25/23 0639  BP: (!) 144/95 (!) 145/96     Fetal Assessment: FHT:  FHR: 150 bpm, variability: minimal ,  accelerations:  Abscent,  decelerations:  Present late , early Category/reactivity:  Category II UC:   irregular, every 1-5 minutes SVE:   1/long/-1, soft/ posterior - Cook cath placed, small amount dark brown then red bleeding.   Membrane status: intact Amniotic color: n/a  Labs: Lab Results  Component Value Date   WBC 14.3 (H) 01/25/2023   HGB 12.6 01/25/2023   HCT 35.9 (L) 01/25/2023   MCV 92.1 01/25/2023   PLT 176 01/25/2023    Assessment / Plan: G1P0 at 470w3dCHTN, AMA, post dates Cat II tracing   - in depth discussion regarding POC, fetal tracing, and induction process.  - pt agrees to trial of nitrous oxide, lidocaine jelly for exam; and CoFrazerath placed, pt did not tolerate well despite interventions.  - Fetal tracing non- reassuring remote from delivery, Dr beLeafy Roalled to assess.   Labor: s/p cytotec  at 0356, oral and vaginal. Contracting but not feeling pain.  Preeclampsia:  Labs WNL, BP remains mild range. No sx Pre-E Fetal Wellbeing:  Category II Pain Control:  Labor support without medications  ReFrancetta FoundCNM 01/25/2023, 9:13 AM

## 2023-01-25 NOTE — Discharge Summary (Signed)
Obstetrical Discharge Summary  Patient Name: Carly Suarez DOB: [birth year] MRN: [id number]  Date of Admission: 01/25/2023 Date of Delivery: 01/25/23 Delivered by: Leafy Ro MD Date of Discharge: 01/27/2023  Primary OB: [facility] LMP:No LMP recorded. EDC Estimated Date of Delivery: 01/22/23 Gestational Age at Delivery: [redacted]w[redacted]d   Antepartum complications:  1. AMA  2. Anxiety/depression 3. 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 History of abnormal pap smear 7. Elevated 1hr GTT 8. 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  Admitting Diagnosis: Encounter for planned induction of labor [Z34.90]  Secondary Diagnosis: NRFHR, primary low transverse cesarean  Patient Active Problem List   Diagnosis Date Noted   Encounter for planned induction of labor 01/25/2023   Elevated blood pressure affecting pregnancy in third trimester, antepartum 01/18/2023   Encounter for supervision of primigravida of advanced maternal age in third trimester 06/17/2022   Non-recurrent acute suppurative otitis media of right ear without spontaneous rupture of tympanic membrane 06/03/2021   Essential hypertension 04/12/2021   Eyelid abnormality 04/12/2021   Elevated blood pressure reading without diagnosis of hypertension 02/23/2021   ADD (attention deficit disorder) 01/29/2021   Positive ANA (antinuclear antibody) 05/08/2020   Chronic neck pain 05/08/2020   Bilateral hand pain 04/28/2020   Arthralgia 04/28/2020   Vulvar cyst 05/13/2019   Metatarsalgia of right foot 08/07/2018   Depression, major, single episode, moderate ([place]) 04/20/2017   GAD (generalized anxiety disorder) 04/20/2017   Cervical disc disease 04/20/2017   Abdominal pain, epigastric - episodic, ? gallstones 10/28/2015   CIN III (cervical intraepithelial neoplasia III) 07/31/2014   HSV-2 (herpes simplex virus 2) infection 05/14/2014    Discharge Diagnosis: Term Pregnancy Delivered and CHTN       Augmentation: Cytotec and cook catheter Complications: None Intrapartum complications/course: admitted for IOL, given cytotec x 1 dose with cat II tracing including late decels, minimal variability attempted [facility] with worsening FHR tracing and decision to proceed to LTCS; see Op note.  Delivery Type: primary cesarean section, low transverse incision Anesthesia: spinal anesthesia Placenta: manual removal- bilobed.  To Pathology: Yes  Laceration: none Episiotomy: none Newborn Data: Live born female "[place]" Birth Weight: 8 lb 2.5 oz (3700 g) APGAR: 6, 8  Newborn Delivery   Birth date/time: 01/25/2023 10:42:00 Delivery type: Primary Low transverse Cesarean      Postpartum Procedures: none  Edinburgh:     01/27/2023    6:00 AM  Edinburgh Postnatal Depression Scale Screening Tool  I have been able to laugh and see the funny side of things. 0  I have looked forward with enjoyment to things. 0  I have blamed myself unnecessarily when things went wrong. 2  I have been anxious or worried for no good reason. 2  I have felt scared or panicky for no good reason. 0  Things have been getting on top of me. 1  I have been so unhappy that I have had difficulty sleeping. 0  I have felt sad or miserable. 0  I have been so unhappy that I have been crying. 0  The thought of harming myself has occurred to me. 0  Edinburgh Postnatal Depression Scale Total 5     Post partum course-Cesarean Section:  Patient had an uncomplicated postpartum course.  By time of discharge on POD#2, her pain was controlled on oral pain medications; she had appropriate lochia and was ambulating, voiding without difficulty, tolerating regular diet and passing flatus.   She was deemed stable  for discharge to home.    Discharge Physical Exam:  BP (!) 134/90 (BP Location: Right Arm)   Pulse 82   Temp (!) 97.4 F (36.3 C) (Oral)   Resp 18   Ht 5\' 3"  (1.6 m)   Wt 84.8 kg   SpO2 99%   Breastfeeding Unknown    BMI 33.13 kg/m   General: NAD CV: RRR Pulm: CTABL, nl effort ABD: s/nd/nt, fundus firm and below the umbilicus Lochia: moderate Incision: c/d/I, covered with occlusive OP site dressing  DVT Evaluation: LE non-ttp, no evidence of DVT on exam.  Hemoglobin  Date Value Ref Range Status  01/26/2023 10.1 (L) 12.0 - 15.0 g/dL Final   Hemoglobin, fingerstick  Date Value Ref Range Status  06/08/2015 13.9 12.0 - 16.0 g/dL Final   HCT  Date Value Ref Range Status  01/26/2023 29.6 (L) 36.0 - 46.0 % Final  07/16/2014 40.9 35.0 - 47.0 % Final    Risk assessment for postpartum VTE and prophylactic treatment: Very high risk factors: None High risk factors: Unscheduled cesarean after labor  Moderate risk factors: BMI 30-40 kg/m2  Postpartum VTE prophylaxis with LMWH not indicated  Disposition: stable, discharge to home. Baby Feeding: breast feeding Baby Disposition: home with mom  Rh Immune globulin indicated: No Rubella vaccine given: offered prior to discharge  Varivax vaccine given: was not indicated Flu vaccine given in AP setting: declined Tdap vaccine given in AP setting: Yes 12/07/22  Contraception: TBD  Prenatal Labs:   Blood type/Rh O pos  Antibody screen neg  Rubella Immune  Varicella Immune  RPR NR  HBsAg Neg  HIV NR  GC neg  Chlamydia neg  Genetic screening negative  1 hour GTT 154  3 hour GTT 91, 145, 151, 104  GBS Neg     Plan:  Donyelle Enyeart was discharged to home in good condition. Follow-up appointment with [facility] in 5 days for blood pressure check and with delivering provider in 2 weeks.  Discharge Medications: Allergies as of 01/27/2023   No Known Allergies      Medication List     TAKE these medications    acetaminophen 500 MG tablet Commonly known as: TYLENOL Take 2 tablets (1,000 mg total) by mouth every 6 (six) hours as needed.   calcium carbonate 750 MG chewable tablet Commonly known as: TUMS EX Chew 1 tablet by mouth daily.    ibuprofen 600 MG tablet Commonly known as: ADVIL Take 1 tablet (600 mg total) by mouth every 6 (six) hours as needed for mild pain or cramping.   NIFEdipine 30 MG 24 hr tablet Commonly known as: ADALAT CC Take 1 tablet (30 mg total) by mouth daily.   oxyCODONE 5 MG immediate release tablet Commonly known as: Oxy IR/ROXICODONE Take 1 tablet (5 mg total) by mouth every 4 (four) hours as needed for up to 7 days for severe pain.   valACYclovir 1000 MG tablet Commonly known as: VALTREX TAKE 1 TABLET (1,000 MG TOTAL) BY MOUTH DAILY AS NEEDED.         Follow-up Information     Benjaman Kindler, MD Follow up in 2 week(s).   Specialty: Obstetrics and Gynecology Why: For postop check Contact information: [place] [address] [phone]         [facility] Follow up in 5 day(s).   Why: blood pressure check Contact information: [place] [phone]  Signed: Minda Meo, CNM 01/27/2023 1:21 PM  Drinda Butts, CNM Certified Nurse Midwife [facility]

## 2023-01-26 ENCOUNTER — Encounter: Payer: Self-pay | Admitting: Obstetrics and Gynecology

## 2023-01-26 LAB — CBC
HCT: 29.6 % — ABNORMAL LOW (ref 36.0–46.0)
Hemoglobin: 10.1 g/dL — ABNORMAL LOW (ref 12.0–15.0)
MCH: 31.9 pg (ref 26.0–34.0)
MCHC: 34.1 g/dL (ref 30.0–36.0)
MCV: 93.4 fL (ref 80.0–100.0)
Platelets: 145 10*3/uL — ABNORMAL LOW (ref 150–400)
RBC: 3.17 MIL/uL — ABNORMAL LOW (ref 3.87–5.11)
RDW: 13.9 % (ref 11.5–15.5)
WBC: 11.7 10*3/uL — ABNORMAL HIGH (ref 4.0–10.5)
nRBC: 0 % (ref 0.0–0.2)

## 2023-01-26 MED ORDER — NIFEDIPINE ER OSMOTIC RELEASE 30 MG PO TB24
30.0000 mg | ORAL_TABLET | Freq: Every day | ORAL | Status: DC
  Administered 2023-01-26 – 2023-01-27 (×2): 30 mg via ORAL
  Filled 2023-01-26 (×2): qty 1

## 2023-01-26 NOTE — Progress Notes (Signed)
Postop Day  1  Subjective: no complaints, up ad lib, voiding, tolerating PO, and + flatus  Doing well, no concerns. Ambulating without difficulty, pain managed with PO meds, tolerating regular diet, and voiding without difficulty.   No fever/chills, chest pain, shortness of breath, nausea/vomiting, or leg pain. No nipple or breast pain. No headache, visual changes, or RUQ/epigastric pain.  Objective: BP 132/70 (BP Location: Left Arm)   Pulse 84   Temp 98.1 F (36.7 C) (Oral)   Resp 20   Ht '5\' 3"'$  (1.6 m)   Wt 84.8 kg   SpO2 98%   Breastfeeding Unknown   BMI 33.13 kg/m    Physical Exam:  General: alert, cooperative, and appears stated age Breasts: soft/nontender CV: RRR Pulm: nl effort, CTABL Abdomen: soft, non-tender, active bowel sounds Uterine Fundus: firm Incision: healing well, no significant drainage, no dehiscence, no significant erythema Perineum: minimal edema, intact Lochia: appropriate DVT Evaluation: No evidence of DVT seen on physical exam. Negative Homan's sign. No cords or calf tenderness. No significant calf/ankle edema.  Recent Labs    01/25/23 0044 01/26/23 0515  HGB 12.6 10.1*  HCT 35.9* 29.6*  WBC 14.3* 11.7*  PLT 176 145*    Assessment/Plan: 37 y.o. G1P1001 postpartum day # 1  -Continue routine postpartum care -Lactation consult PRN for breastfeeding Encouraged snug fitting bra, cold application, Tylenol PRN, and cabbage leaves for engorgement for formula feeding  -Discussed contraceptive options including implant, IUDs hormonal and non-hormonal, injection, pills/ring/patch, condoms, and NFP.  -Acute blood loss anemia - hemodynamically stable and asymptomatic; start PO ferrous sulfate BID with stool softeners  -Procardia 30 mg daily started today -Immunization status:   all immunizations up to date   Disposition: Continue inpatient postpartum care   LOS: 1 day   [place], CNM 01/26/2023, [id number] PM   ----- Avelino Leeds Certified Nurse Midwife [facility]

## 2023-01-26 NOTE — Lactation Note (Signed)
This note was copied from a baby's chart. Lactation Consultation Note  Patient Name: Carly Suarez [id number] Date: 01/26/2023 Age:37 years Reason for consult: Follow-up assessment;Term  Lactation follow-up  Maternal Data Has patient been taught Hand Expression?: Yes Does the patient have breastfeeding experience prior to this delivery?: No  Parents report overnight to have gone well. Mom sounding stressed this morning.  Feeding Mother's Current Feeding Choice: Breast Milk  LC at bedside to assist with feeding that parents are attempting upon entry. Baby in football hold on R breast, nipple shield in place, baby crying. LC adjusted position slightly. Baby was still on/off the breast, inconsistent nor rhythmic with her sucking pattern.   LATCH Score Latch: Repeated attempts needed to sustain latch, nipple held in mouth throughout feeding, stimulation needed to elicit sucking reflex.  Audible Swallowing: None  Type of Nipple: Flat  Comfort (Breast/Nipple): Soft / non-tender  Hold (Positioning): Assistance needed to correctly position infant at breast and maintain latch.  LATCH Score: 5   Lactation Tools Discussed/Used Tools: Nipple Shields Nipple shield size: 20  Interventions Interventions: Breast feeding basics reviewed;Assisted with latch;Hand express;Breast compression;Adjust position;Support pillows;Education (Feeding plan)  We discussed feeding plan for 24-48 hours based on feeding pattern/behaviors. -Feed on demand (every 3-4 hours) -Attempt at the breast first, no more than 10 minutes-  -Supplement with appropriate volumes- -Pump initiated and started  Parents verbalize understanding of the plan.  Discharge Pump: Personal  Consult Status Consult Status: Follow-up    Lavonia Drafts 01/26/2023, 11:11 AM

## 2023-01-26 NOTE — Lactation Note (Signed)
This note was copied from a baby's chart. Lactation Consultation Note  Patient Name: Carly Suarez [id number] Date: 01/26/2023 Age:37 hours Reason for consult: Follow-up assessment;Primapara;Term   Maternal Data Has patient been taught Hand Expression?: Yes Does the patient have breastfeeding experience prior to this delivery?: No  Feeding Mother's Current Feeding Choice: Breast Milk  LC assisted with feeding. Placed in football hold on the L breast with nipple shield. Baby latched more easily during this attempt- wide mouth and had rhythmic sucking pattern. Baby was consistent at the breast- consistent with sucking and had a few audible swallows.  Due to having to work a little harder baby would become frustrated and then tired at times, and eventually after about 15 minutes became tired.  LATCH Score Latch: Grasps breast easily, tongue down, lips flanged, rhythmical sucking.  Audible Swallowing: A few with stimulation  Type of Nipple: Flat  Comfort (Breast/Nipple): Soft / non-tender  Hold (Positioning): Assistance needed to correctly position infant at breast and maintain latch.  LATCH Score: 7   Lactation Tools Discussed/Used Tools: Nipple Shields Nipple shield size: 20   LC set-up DEBP at bedside, demonstrated set-up, use, and gave instructions on cleaning. Instructed mom to use every time post feedings; minimal every 3 hours.  Interventions Interventions: Breast feeding basics reviewed;Assisted with latch;Hand express;Breast compression;Adjust position;Support pillows;DEBP;Education  We talked about feeding plan, possible need for supplement if baby wasn't seeming satisfied after breast feeding. Talked about difficult with feedings use of nipple shield, flat nipples.  Discussed use of formula supplement as needed.  Discharge Pump: Personal  Consult Status Consult Status: Follow-up    Lavonia Drafts 01/26/2023, 12:25 PM

## 2023-01-26 NOTE — Anesthesia Postprocedure Evaluation (Signed)
Anesthesia Post Note  Patient: Carly Suarez  Procedure(s) Performed: [place]  Patient location during evaluation: Mother Baby Anesthesia Type: Spinal Level of consciousness: awake Pain management: pain level controlled Respiratory status: spontaneous breathing Postop Assessment: no headache Anesthetic complications: no   No notable events documented.   Last Vitals:  Vitals:   01/25/23 2356 01/26/23 0400  BP: (!) 139/100 (!) 125/95  Pulse: 78 84  Resp:    Temp: 36.7 C 36.8 C  SpO2:  97%    Last Pain:  Vitals:   01/26/23 0400  TempSrc: Oral  PainSc: 0-No pain                 Lerry Liner

## 2023-01-27 LAB — SURGICAL PATHOLOGY

## 2023-01-27 MED ORDER — OXYCODONE HCL 5 MG PO TABS: 5.0000 mg | ORAL_TABLET | ORAL | 0 refills | Status: AC | PRN

## 2023-01-27 MED ORDER — IBUPROFEN 600 MG PO TABS: 600.0000 mg | ORAL_TABLET | Freq: Four times a day (QID) | ORAL | 0 refills | Status: DC | PRN

## 2023-01-27 MED ORDER — ACETAMINOPHEN 500 MG PO TABS: 1000.0000 mg | ORAL_TABLET | Freq: Four times a day (QID) | ORAL | 0 refills | Status: DC | PRN

## 2023-01-27 MED ORDER — NIFEDIPINE ER 30 MG PO TB24: 30.0000 mg | ORAL_TABLET | Freq: Every day | ORAL | 1 refills | Status: DC

## 2023-01-27 NOTE — Lactation Note (Signed)
This note was copied from a baby's chart. Lactation Consultation Note  Patient Name: Carly Suarez [id number] Date: 01/27/2023 Age:37 years Reason for consult: Follow-up assessment;Primapara;Term   Maternal Data See initial consult Has patient been taught Hand Expression?: Yes Does the patient have breastfeeding experience prior to this delivery?: No  Feeding Mother's Current Feeding Choice: Breast Milk and Formula Mom reports she offers baby to breastfeed. After each breastfeed dad supplements baby with formula, and mom pumps. Per mom baby "is catching on quickly and started to latch and breastfeed better." Parents plan to continue this feeding plan for now until mom's milk transitions in.  Interventions Interventions: Breast feeding basics reviewed;Education Reviewed feeding plan with parents including formula supplementation volumes to offer baby post breastfeeding. Provided tips and strategies to maximize milk production. Encouraged mom to contact [facility] lactation if she needs additional breastfeeding help once she goes home and her milk is in if baby does not consistently latch and breastfeed.  Discharge Discharge Education: Engorgement and breast care;Warning signs for feeding baby;Outpatient recommendation Pump: Personal (Mom reports her personal pump is out for delivery today. If her pump does not arrive she may rent one for now.)  Consult Status Consult Status: Complete Date: 01/27/23 Follow-up type: In-patient  Update provided to care nurse.   Carly Suarez 01/27/2023, 11:54 AM

## 2023-01-27 NOTE — Discharge Instructions (Signed)
?Cesarean Delivery, Care After ?Refer to this sheet in the next few weeks. These instructions provide you with information on caring for yourself after your procedure. Your health care provider may also give you specific instructions. Your treatment has been planned according to current medical practices, but problems sometimes occur. Call your health care provider if you have any problems or questions after you go home. ?HOME CARE INSTRUCTIONS  ?Please leave honey comb dressing (OP Site) on for 7 days.  You may shower during this period but turn your back to the water so that the dressing does not get directly saturated by the water.   ?You may take the dressing off on day 7.  The easiest way to do it is in the shower.  Allow the water to run over the dressing and it usually comes off easier.   ?Only take over-the-counter or prescription medications as directed by your health care provider. ?Do not drink alcohol, especially if you are breastfeeding or taking medication to relieve pain. ?Do not  smoke tobacco. ?Continue to use good perineal care. Good perineal care includes: ?Wiping your perineum from front to back. ?Keeping your perineum clean. ?Check your surgical cut (incision) daily for increased redness, drainage, swelling, or separation of skin. ?Shower and clean your incision gently with soap and water every day, by letting warm and soapy water run over the incision, and then pat it dry. If your health care provider says it is okay, leave the incision uncovered. Use a bandage (dressing) if the incision is draining fluid or appears irritated. If the adhesive strips across the incision do not fall off within 7 days, carefully peel them off, after a shower. ?Hug a pillow when coughing or sneezing until your incision is healed. This helps to relieve pain. ?Do not use tampons, douches or have sexual intercourse, until your health care provider says it is okay. ?Wear a well-fitting bra that provides breast  support. ?Limit wearing support panties or control-top hose. ?Drink enough fluids to keep your urine clear or pale yellow. ?Eat high-fiber foods such as whole grain cereals and breads, brown rice, beans, and fresh fruits and vegetables every day. These foods may help prevent or relieve constipation. ?Resume activities such as climbing stairs, driving, lifting, exercising, or traveling as directed by your health care provider. ?Try to have someone help you with your household activities and your newborn for at least a few days after you leave the hospital. ?Rest as much as possible. Try to rest or take a nap when your newborn is sleeping. ?Increase your activities gradually. ?Do not lift more than 15lbs until directed by a provider. ?Keep all of your scheduled postpartum appointments. It is very important to keep your scheduled follow-up appointments. At these appointments, your health care provider will be checking to make sure that you are healing physically and emotionally. ?SEEK MEDICAL CARE IF:  ?You are passing large clots from your vagina. Save any clots to show your health care provider. ?You have a foul smelling discharge from your vagina. ?You have trouble urinating. ?You are urinating frequently. ?You have pain when you urinate. ?You have a change in your bowel movements. ?You have increasing redness, pain, or swelling near your incision. ?You have pus draining from your incision. ?Your incision is separating. ?You have painful, hard, or reddened breasts. ?You have a severe headache. ?You have blurred vision or see spots. ?You feel sad or depressed. ?You have thoughts of hurting yourself or your newborn. ?You have questions   about your care, the care of your newborn, or medications. ?You are dizzy or light-headed. ?You have a rash. ?You have pain, redness, or swelling at the site of the removed intravenous access (IV) tube. ?You have nausea or vomiting. ?You stopped breastfeeding and have not had a  menstrual period within 12 weeks of stopping. ?You are not breastfeeding and have not had a menstrual period within 12 weeks of delivery. ?You have a fever. ?SEEK IMMEDIATE MEDICAL CARE IF: ?You have persistent pain. ?You have chest pain. ?You have shortness of breath. ?You faint. ?You have leg pain. ?You have stomach pain. ?Your vaginal bleeding saturates 2 or more sanitary pads in 1 hour. ?MAKE SURE YOU:  ?Understand these instructions. ?Will watch your condition. ?Will get help right away if you are not doing well or get worse. ?Document Released: 07/30/2002 Document Revised: 03/24/2014 Document Reviewed: 07/04/2012 ?ExitCare? Patient Information ?2015 [employer]. This information is not intended to replace advice given to you by your health care provider. Make sure you discuss any questions you have with your health care provider. ? ?

## 2023-01-31 DIAGNOSIS — O1003 Pre-existing essential hypertension complicating the puerperium: Secondary | ICD-10-CM | POA: Diagnosis not present

## 2023-02-08 DIAGNOSIS — L292 Pruritus vulvae: Secondary | ICD-10-CM | POA: Diagnosis not present

## 2023-02-08 DIAGNOSIS — N942 Vaginismus: Secondary | ICD-10-CM | POA: Diagnosis not present

## 2023-02-08 DIAGNOSIS — N941 Unspecified dyspareunia: Secondary | ICD-10-CM | POA: Diagnosis not present

## 2023-02-08 DIAGNOSIS — R102 Pelvic and perineal pain: Secondary | ICD-10-CM | POA: Diagnosis not present

## 2023-02-23 DIAGNOSIS — M25531 Pain in right wrist: Secondary | ICD-10-CM | POA: Diagnosis not present

## 2023-02-23 DIAGNOSIS — M654 Radial styloid tenosynovitis [de Quervain]: Secondary | ICD-10-CM | POA: Diagnosis not present

## 2023-02-27 ENCOUNTER — Telehealth: Payer: Self-pay

## 2023-02-27 NOTE — Telephone Encounter (Signed)
[facility]- Discharge Call Backs 1-Do you have any questions or concerns about yourself as you heal?No 2-Any concerns or questions about your baby?No-1 month appt coming up soon. 3-Reviewed ABC's of safe sleep. 4-How was your stay at the hospital?Good 5-How did our team work together to care for you?"I can't think of anything". You should be receiving a survey in the mail soon.   We would really appreciate it if you could fill that out for [place] and return it in the mail.  We value the feedback to make improvements and continue the great work we do.   If you have any questions please feel free to call me back at [phone]

## 2023-03-02 DIAGNOSIS — G43119 Migraine with aura, intractable, without status migrainosus: Secondary | ICD-10-CM | POA: Diagnosis not present

## 2023-03-02 DIAGNOSIS — M5481 Occipital neuralgia: Secondary | ICD-10-CM | POA: Diagnosis not present

## 2023-03-17 DIAGNOSIS — F53 Postpartum depression: Secondary | ICD-10-CM | POA: Diagnosis not present

## 2023-04-05 DIAGNOSIS — Z3009 Encounter for other general counseling and advice on contraception: Secondary | ICD-10-CM | POA: Diagnosis not present

## 2023-04-05 DIAGNOSIS — F53 Postpartum depression: Secondary | ICD-10-CM | POA: Diagnosis not present

## 2023-05-22 DIAGNOSIS — F53 Postpartum depression: Secondary | ICD-10-CM | POA: Diagnosis not present

## 2023-06-09 ENCOUNTER — Other Ambulatory Visit: Payer: Self-pay | Admitting: Family Medicine

## 2023-06-09 NOTE — Telephone Encounter (Signed)
Last office visit 01/07/22 for CPE.  Last refilled 12/12/22 for #90 with 1 refill.  Next Appt:  No future appointments.  Please call and schedule CPE with fasting labs for Dr. [name].

## 2023-06-09 NOTE — Telephone Encounter (Signed)
LVM for patient to c/b and schedule.  

## 2023-06-09 NOTE — Telephone Encounter (Signed)
Spoke to pt, scheduled cpe for 06/29/23

## 2023-06-12 ENCOUNTER — Telehealth: Payer: Self-pay | Admitting: *Deleted

## 2023-06-12 DIAGNOSIS — I10 Essential (primary) hypertension: Secondary | ICD-10-CM

## 2023-06-12 NOTE — Telephone Encounter (Signed)
-----   Message from Alvina Chou sent at 06/12/2023 10:46 AM EDT ----- Regarding: Lab orders for Friday, 8.2.24 Patient is scheduled for CPX labs, please order future labs, Thanks , Camelia Eng

## 2023-06-23 ENCOUNTER — Other Ambulatory Visit (INDEPENDENT_AMBULATORY_CARE_PROVIDER_SITE_OTHER): Payer: BC Managed Care – PPO

## 2023-06-23 DIAGNOSIS — I10 Essential (primary) hypertension: Secondary | ICD-10-CM

## 2023-06-23 LAB — LIPID PANEL
Cholesterol: 204 mg/dL — ABNORMAL HIGH (ref 0–200)
HDL: 61.3 mg/dL (ref >39.00)
LDL Cholesterol: 119 mg/dL — ABNORMAL HIGH (ref 0–99)
NonHDL: 142.56
Total CHOL/HDL Ratio: 3
Triglycerides: 117 mg/dL (ref 0.0–149.0)
VLDL: 23.4 mg/dL (ref 0.0–40.0)

## 2023-06-23 LAB — COMPREHENSIVE METABOLIC PANEL
ALT: 12 U/L (ref 0–35)
AST: 14 U/L (ref 0–37)
Albumin: 4.2 g/dL (ref 3.5–5.2)
Alkaline Phosphatase: 61 U/L (ref 39–117)
BUN: 11 mg/dL (ref 6–23)
CO2: 26 mEq/L (ref 19–32)
Calcium: 9.5 mg/dL (ref 8.4–10.5)
Chloride: 105 mEq/L (ref 96–112)
Creatinine, Ser: 0.69 mg/dL (ref 0.40–1.20)
GFR: 110.94 mL/min (ref >60.00)
Glucose, Bld: 86 mg/dL (ref 70–99)
Potassium: 4.1 mEq/L (ref 3.5–5.1)
Sodium: 138 mEq/L (ref 135–145)
Total Bilirubin: 0.5 mg/dL (ref 0.2–1.2)
Total Protein: 6.8 g/dL (ref 6.0–8.3)

## 2023-06-25 NOTE — Progress Notes (Signed)
No critical labs need to be addressed urgently. We will discuss labs in detail at upcoming office visit.   

## 2023-06-29 ENCOUNTER — Encounter: Payer: Self-pay | Admitting: Family Medicine

## 2023-06-29 ENCOUNTER — Ambulatory Visit (INDEPENDENT_AMBULATORY_CARE_PROVIDER_SITE_OTHER): Payer: BC Managed Care – PPO | Admitting: Family Medicine

## 2023-06-29 VITALS — BP 118/82 | HR 55 | Temp 97.4°F | Ht 62.5 in | Wt 157.4 lb

## 2023-06-29 DIAGNOSIS — Z0000 Encounter for general adult medical examination without abnormal findings: Secondary | ICD-10-CM

## 2023-06-29 DIAGNOSIS — I10 Essential (primary) hypertension: Secondary | ICD-10-CM

## 2023-06-29 DIAGNOSIS — F321 Major depressive disorder, single episode, moderate: Secondary | ICD-10-CM | POA: Diagnosis not present

## 2023-06-29 NOTE — Assessment & Plan Note (Signed)
Now resolved on no medication.

## 2023-06-29 NOTE — Assessment & Plan Note (Signed)
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

## 2023-06-29 NOTE — Progress Notes (Signed)
Patient ID: Kyong Desmidt, female    DOB: 07/24/1986, 37 y.o.   MRN: [id number]  This visit was conducted in person.  BP 118/82   Pulse (!) 55   Temp (!) 97.4 F (36.3 C) (Temporal)   Ht 5' 2.5" (1.588 m)   Wt 157 lb 6 oz (71.4 kg)   LMP 06/18/2023   SpO2 99%   BMI 28.33 kg/m    CC: Chief Complaint  Patient presents with   Annual Exam    Subjective:   HPI: Jemimah Gunst is a 37 y.o. female presenting on 06/29/2023 for Annual Exam  She has had a child in the last year.    Doing well overall.  Reviewed labs in detail with patient. Earlene Plater. 41 month old. Had C section for failure to progress/ fetal distress.  No post partum issues other than elevated BPs after delivery. No longer on nifedipine   She has  stopped all her mediation for mood prior to pregnancy.  She has had some mild post partum depression...  back on sertraline 100 mg daily.    06/29/2023   12:13 PM 01/07/2022    4:55 PM 02/23/2021    5:01 PM  Depression screen PHQ 2/9  Decreased Interest 0 2 1  Down, Depressed, Hopeless 0 2 3  PHQ - 2 Score 0 4 4  Altered sleeping  3 1  Tired, decreased energy  3 1  Change in appetite  0 0  Feeling bad or failure about yourself   2 3  Trouble concentrating  3 3  Moving slowly or fidgety/restless  0 0  Suicidal thoughts  0 0  PHQ-9 Score  15 12  Difficult doing work/chores  Very difficult Extremely dIfficult    Reviewed  labs in detail.  She has stopped her birth control.  Hypertension:  S She has not had any issues with blood pressure  past the immediate postpartum period. BP Readings from Last 3 Encounters:  06/29/23 118/82  01/27/23 (!) 134/90  01/18/23 134/82  Using medication without problems or lightheadedness:  none Chest pain with exertion: none Edema:none Short of breath: none Average home BPs: 120/70 s at [facility] Other issues:   Lab Results  Component Value Date   CHOL 204 (H) 06/23/2023   HDL 61.30 06/23/2023   LDLCALC 119 (H)  06/23/2023   TRIG 117.0 06/23/2023   CHOLHDL 3 06/23/2023    Diet: moderate... water, fruits and veggies.  Exercise: none  She is back to work.      Relevant past medical, surgical, family and social history reviewed and updated as indicated. Interim medical history since our last visit reviewed. Allergies and medications reviewed and updated. Outpatient Medications Prior to Visit  Medication Sig Dispense Refill   valACYclovir (VALTREX) 1000 MG tablet TAKE 1 TABLET (1,000 MG TOTAL) BY MOUTH DAILY AS NEEDED. 90 tablet 1   acetaminophen (TYLENOL) 500 MG tablet Take 2 tablets (1,000 mg total) by mouth every 6 (six) hours as needed. 30 tablet 0   calcium carbonate (TUMS EX) 750 MG chewable tablet Chew 1 tablet by mouth daily.     ibuprofen (ADVIL) 600 MG tablet Take 1 tablet (600 mg total) by mouth every 6 (six) hours as needed for mild pain or cramping. 30 tablet 0   NIFEdipine (ADALAT CC) 30 MG 24 hr tablet Take 1 tablet (30 mg total) by mouth daily. 30 tablet 1   No facility-administered medications prior to visit.  Per HPI unless specifically indicated in ROS section below Review of Systems  Constitutional:  Negative for fatigue and fever.  HENT:  Negative for congestion.   Eyes:  Negative for pain.  Respiratory:  Negative for cough and shortness of breath.   Cardiovascular:  Negative for chest pain, palpitations and leg swelling.  Gastrointestinal:  Negative for abdominal pain.  Genitourinary:  Negative for dysuria and vaginal bleeding.  Musculoskeletal:  Negative for back pain.  Neurological:  Negative for syncope, light-headedness and headaches.  Psychiatric/Behavioral:  Negative for dysphoric mood.    Objective:  BP 118/82   Pulse (!) 55   Temp (!) 97.4 F (36.3 C) (Temporal)   Ht 5' 2.5" (1.588 m)   Wt 157 lb 6 oz (71.4 kg)   LMP 06/18/2023   SpO2 99%   BMI 28.33 kg/m   Wt Readings from Last 3 Encounters:  06/29/23 157 lb 6 oz (71.4 kg)  01/25/23 187 lb (84.8  kg)  01/18/23 187 lb (84.8 kg)      Physical Exam Vitals and nursing note reviewed.  Constitutional:      General: She is not in acute distress.    Appearance: Normal appearance. She is well-developed. She is not ill-appearing or toxic-appearing.  HENT:     Head: Normocephalic.     Right Ear: Hearing, tympanic membrane, ear canal and external ear normal.     Left Ear: Hearing, tympanic membrane, ear canal and external ear normal.     Nose: Nose normal.  Eyes:     General: Lids are normal. Lids are everted, no foreign bodies appreciated.     Conjunctiva/sclera: Conjunctivae normal.     Pupils: Pupils are equal, round, and reactive to light.  Neck:     Thyroid: No thyroid mass or thyromegaly.     Vascular: No carotid bruit.     Trachea: Trachea normal.  Cardiovascular:     Rate and Rhythm: Normal rate and regular rhythm.     Heart sounds: Normal heart sounds, S1 normal and S2 normal. No murmur heard.    No gallop.  Pulmonary:     Effort: Pulmonary effort is normal. No respiratory distress.     Breath sounds: Normal breath sounds. No wheezing, rhonchi or rales.  Abdominal:     General: Bowel sounds are normal. There is no distension or abdominal bruit.     Palpations: Abdomen is soft. There is no fluid wave or mass.     Tenderness: There is no abdominal tenderness. There is no guarding or rebound.     Hernia: No hernia is present.  Musculoskeletal:     Cervical back: Normal range of motion and neck supple.  Lymphadenopathy:     Cervical: No cervical adenopathy.  Skin:    General: Skin is warm and dry.     Findings: No rash.  Neurological:     Mental Status: She is alert.     Cranial Nerves: No cranial nerve deficit.     Sensory: No sensory deficit.  Psychiatric:        Mood and Affect: Mood is not anxious or depressed.        Speech: Speech normal.        Behavior: Behavior normal. Behavior is cooperative.        Judgment: Judgment normal.       Results for orders  placed or performed in visit on 06/23/23  Lipid panel  Result Value Ref Range   Cholesterol 204 (H) 0 -  200 mg/dL   Triglycerides 161.0 0.0 - 149.0 mg/dL   HDL 96.04 >54.09 mg/dL   VLDL 81.1 0.0 - 91.4 mg/dL   LDL Cholesterol 782 (H) 0 - 99 mg/dL   Total CHOL/HDL Ratio 3    NonHDL 142.56   Comprehensive metabolic panel  Result Value Ref Range   Sodium 138 135 - 145 mEq/L   Potassium 4.1 3.5 - 5.1 mEq/L   Chloride 105 96 - 112 mEq/L   CO2 26 19 - 32 mEq/L   Glucose, Bld 86 70 - 99 mg/dL   BUN 11 6 - 23 mg/dL   Creatinine, Ser 9.56 0.40 - 1.20 mg/dL   Total Bilirubin 0.5 0.2 - 1.2 mg/dL   Alkaline Phosphatase 61 39 - 117 U/L   AST 14 0 - 37 U/L   ALT 12 0 - 35 U/L   Total Protein 6.8 6.0 - 8.3 g/dL   Albumin 4.2 3.5 - 5.2 g/dL   GFR 213.08 >65.78 mL/min   Calcium 9.5 8.4 - 10.5 mg/dL    This visit occurred during the SARS-CoV-2 public health emergency.  Safety protocols were in place, including screening questions prior to the visit, additional usage of staff PPE, and extensive cleaning of exam room while observing appropriate contact time as indicated for disinfecting solutions.   COVID 19 screen:  No recent travel or known exposure to COVID19 The patient denies respiratory symptoms of COVID 19 at this time. The importance of social distancing was discussed today.   Assessment and Plan The patient's preventative maintenance and recommended screening tests for an annual wellness exam were reviewed in full today. Brought up to date unless services declined.  Counselled on the importance of diet, exercise, and its role in overall health and mortality. The patient's FH and SH was reviewed, including their home life, tobacco status, and drug and alcohol status.    Vaccines: Tdap uptodate 2017, has received HPV x 3, COVID x 2, refused flu. PAP/Breast: per GYN.. 02/2020 neg pap.. per  GYN  no early family history of breast cancer Colon: no early family history.  Nonsmoker  ETOH:  1-2 several nights a week.  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      RESOLVED: Essential hypertension      Now resolved on no medication.      Other Visit Diagnoses     Routine general medical examination at a health care facility    -  Primary         Kerby Nora, MD

## 2023-07-03 DIAGNOSIS — M62830 Muscle spasm of back: Secondary | ICD-10-CM | POA: Diagnosis not present

## 2023-07-03 DIAGNOSIS — M5481 Occipital neuralgia: Secondary | ICD-10-CM | POA: Diagnosis not present

## 2023-07-03 DIAGNOSIS — M9901 Segmental and somatic dysfunction of cervical region: Secondary | ICD-10-CM | POA: Diagnosis not present

## 2023-07-03 DIAGNOSIS — M9903 Segmental and somatic dysfunction of lumbar region: Secondary | ICD-10-CM | POA: Diagnosis not present

## 2023-07-03 DIAGNOSIS — G43119 Migraine with aura, intractable, without status migrainosus: Secondary | ICD-10-CM | POA: Diagnosis not present

## 2023-07-03 DIAGNOSIS — M9902 Segmental and somatic dysfunction of thoracic region: Secondary | ICD-10-CM | POA: Diagnosis not present

## 2023-08-22 DIAGNOSIS — N63 Unspecified lump in unspecified breast: Secondary | ICD-10-CM | POA: Diagnosis not present

## 2023-08-24 ENCOUNTER — Other Ambulatory Visit: Payer: Self-pay | Admitting: Obstetrics and Gynecology

## 2023-08-24 DIAGNOSIS — M79642 Pain in left hand: Secondary | ICD-10-CM | POA: Diagnosis not present

## 2023-08-24 DIAGNOSIS — R200 Anesthesia of skin: Secondary | ICD-10-CM | POA: Diagnosis not present

## 2023-08-24 DIAGNOSIS — M654 Radial styloid tenosynovitis [de Quervain]: Secondary | ICD-10-CM | POA: Diagnosis not present

## 2023-08-24 DIAGNOSIS — M79641 Pain in right hand: Secondary | ICD-10-CM | POA: Diagnosis not present

## 2023-08-24 DIAGNOSIS — N63 Unspecified lump in unspecified breast: Secondary | ICD-10-CM

## 2023-09-04 ENCOUNTER — Ambulatory Visit
Admission: RE | Admit: 2023-09-04 | Discharge: 2023-09-04 | Disposition: A | Discharge status: Home / Self Care | Payer: BC Managed Care – PPO | Source: Ambulatory Visit | Attending: Obstetrics and Gynecology | Admitting: Obstetrics and Gynecology

## 2023-09-04 DIAGNOSIS — N63 Unspecified lump in unspecified breast: Secondary | ICD-10-CM | POA: Diagnosis not present

## 2023-09-04 DIAGNOSIS — N6325 Unspecified lump in the left breast, overlapping quadrants: Secondary | ICD-10-CM | POA: Diagnosis not present

## 2023-09-04 DIAGNOSIS — N6321 Unspecified lump in the left breast, upper outer quadrant: Secondary | ICD-10-CM | POA: Diagnosis not present

## 2023-09-04 DIAGNOSIS — Z803 Family history of malignant neoplasm of breast: Secondary | ICD-10-CM | POA: Diagnosis not present

## 2023-09-04 DIAGNOSIS — N6002 Solitary cyst of left breast: Secondary | ICD-10-CM | POA: Diagnosis not present

## 2023-10-18 DIAGNOSIS — M654 Radial styloid tenosynovitis [de Quervain]: Secondary | ICD-10-CM | POA: Diagnosis not present

## 2023-10-18 DIAGNOSIS — R200 Anesthesia of skin: Secondary | ICD-10-CM | POA: Diagnosis not present

## 2023-10-31 ENCOUNTER — Encounter: Payer: Self-pay | Admitting: Family Medicine

## 2023-11-01 ENCOUNTER — Other Ambulatory Visit: Payer: Self-pay | Admitting: Family Medicine

## 2023-11-01 MED ORDER — TRIAMCINOLONE ACETONIDE 0.1 % EX CREA: 1.0000 | TOPICAL_CREAM | Freq: Two times a day (BID) | CUTANEOUS | 0 refills | Status: DC

## 2023-11-08 DIAGNOSIS — M9903 Segmental and somatic dysfunction of lumbar region: Secondary | ICD-10-CM | POA: Diagnosis not present

## 2023-11-08 DIAGNOSIS — M62830 Muscle spasm of back: Secondary | ICD-10-CM | POA: Diagnosis not present

## 2023-11-08 DIAGNOSIS — M9902 Segmental and somatic dysfunction of thoracic region: Secondary | ICD-10-CM | POA: Diagnosis not present

## 2023-11-08 DIAGNOSIS — M9901 Segmental and somatic dysfunction of cervical region: Secondary | ICD-10-CM | POA: Diagnosis not present

## 2023-11-13 DIAGNOSIS — M9903 Segmental and somatic dysfunction of lumbar region: Secondary | ICD-10-CM | POA: Diagnosis not present

## 2023-11-13 DIAGNOSIS — M9902 Segmental and somatic dysfunction of thoracic region: Secondary | ICD-10-CM | POA: Diagnosis not present

## 2023-11-13 DIAGNOSIS — M62830 Muscle spasm of back: Secondary | ICD-10-CM | POA: Diagnosis not present

## 2023-11-13 DIAGNOSIS — M9901 Segmental and somatic dysfunction of cervical region: Secondary | ICD-10-CM | POA: Diagnosis not present

## 2023-11-17 DIAGNOSIS — M9902 Segmental and somatic dysfunction of thoracic region: Secondary | ICD-10-CM | POA: Diagnosis not present

## 2023-11-17 DIAGNOSIS — M62830 Muscle spasm of back: Secondary | ICD-10-CM | POA: Diagnosis not present

## 2023-11-17 DIAGNOSIS — M9903 Segmental and somatic dysfunction of lumbar region: Secondary | ICD-10-CM | POA: Diagnosis not present

## 2023-11-17 DIAGNOSIS — M9901 Segmental and somatic dysfunction of cervical region: Secondary | ICD-10-CM | POA: Diagnosis not present

## 2023-11-27 DIAGNOSIS — M9901 Segmental and somatic dysfunction of cervical region: Secondary | ICD-10-CM | POA: Diagnosis not present

## 2023-11-27 DIAGNOSIS — M62830 Muscle spasm of back: Secondary | ICD-10-CM | POA: Diagnosis not present

## 2023-11-27 DIAGNOSIS — M9902 Segmental and somatic dysfunction of thoracic region: Secondary | ICD-10-CM | POA: Diagnosis not present

## 2023-11-27 DIAGNOSIS — M9903 Segmental and somatic dysfunction of lumbar region: Secondary | ICD-10-CM | POA: Diagnosis not present

## 2023-12-05 ENCOUNTER — Other Ambulatory Visit (HOSPITAL_COMMUNITY): Payer: Self-pay

## 2023-12-15 ENCOUNTER — Telehealth: Payer: Self-pay | Admitting: Family Medicine

## 2023-12-15 MED ORDER — OSELTAMIVIR PHOSPHATE 75 MG PO CAPS: 75.0000 mg | ORAL_CAPSULE | Freq: Every day | ORAL | 0 refills | Status: DC

## 2023-12-15 NOTE — Telephone Encounter (Signed)
Patient's husband diagnosed in office today with influenza A.  Given close contact we will treat Carly Suarez with prophylactic Tamiflu 75 mg p.o. daily x 10 days.  Patient aware.

## 2024-01-11 ENCOUNTER — Ambulatory Visit: Payer: BC Managed Care – PPO | Admitting: Family Medicine

## 2024-01-11 ENCOUNTER — Encounter: Payer: Self-pay | Admitting: Family Medicine

## 2024-01-11 VITALS — BP 126/64 | HR 74 | Temp 98.5°F | Ht 62.5 in | Wt 162.0 lb

## 2024-01-11 DIAGNOSIS — J069 Acute upper respiratory infection, unspecified: Secondary | ICD-10-CM | POA: Diagnosis not present

## 2024-01-11 DIAGNOSIS — J029 Acute pharyngitis, unspecified: Secondary | ICD-10-CM

## 2024-01-11 LAB — POCT RAPID STREP A (OFFICE): Rapid Strep A Screen: NEGATIVE

## 2024-01-11 MED ORDER — POLYMYXIN B-TRIMETHOPRIM 10000-0.1 UNIT/ML-% OP SOLN: 1.0000 [drp] | Freq: Four times a day (QID) | OPHTHALMIC | 0 refills | Status: AC

## 2024-01-11 NOTE — Progress Notes (Signed)
 L eye conjunctival irritation, some eyelid crusting.   ST started 2 nights ago.  Sore with swallowing.  Throat has been red.  Stuffy runny nose.  Some congestion.  Altered taste and smell.  Neg covid test at home this AM.  Fatigued.  No fevers, no chills.  No vomiting, no diarrhea.    No vision changes.  Doesn't wear contacts.    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    At home with husband and 38 year old- daughter had pinkeye last week- resolved in the meantime.   Meds, vitals, and allergies reviewed.   ROS: Per HPI unless specifically indicated in ROS section   Nad Ncat L eye injection w/o FB.  No R eye injection.  Nasal exam stuffy.  TM wnl B MMM with OP irritation Neck supple, no LA Rrr Ctab Abd soft, not ttp  RST neg at OV, d/w pt.

## 2024-01-11 NOTE — Patient Instructions (Addendum)
Out of work in the meantime.  Rest and fluids.  Take care.  Glad to see you. Gargle with salt water and start polytrim.  Nasal saline or flonase.

## 2024-01-12 ENCOUNTER — Encounter: Payer: Self-pay | Admitting: Family Medicine

## 2024-01-13 DIAGNOSIS — J069 Acute upper respiratory infection, unspecified: Secondary | ICD-10-CM | POA: Insufficient documentation

## 2024-01-13 NOTE — Assessment & Plan Note (Signed)
 Could have URI (which should resolve) with conjunctivitis from the same illness, or could have URI and separate acute conjunctivitis. D/w pt.   RST neg.  Okay for outpatient f/u.  Out of work in the meantime.  Rest and fluids.  Gargle with salt water and start polytrim.  Nasal saline or flonase.

## 2024-01-17 DIAGNOSIS — M62830 Muscle spasm of back: Secondary | ICD-10-CM | POA: Diagnosis not present

## 2024-01-17 DIAGNOSIS — M9902 Segmental and somatic dysfunction of thoracic region: Secondary | ICD-10-CM | POA: Diagnosis not present

## 2024-01-17 DIAGNOSIS — M9901 Segmental and somatic dysfunction of cervical region: Secondary | ICD-10-CM | POA: Diagnosis not present

## 2024-01-17 DIAGNOSIS — M9903 Segmental and somatic dysfunction of lumbar region: Secondary | ICD-10-CM | POA: Diagnosis not present

## 2024-06-12 DIAGNOSIS — Z3201 Encounter for pregnancy test, result positive: Secondary | ICD-10-CM | POA: Diagnosis not present

## 2024-06-12 DIAGNOSIS — N912 Amenorrhea, unspecified: Secondary | ICD-10-CM | POA: Diagnosis not present

## 2024-07-09 DIAGNOSIS — Z3481 Encounter for supervision of other normal pregnancy, first trimester: Secondary | ICD-10-CM | POA: Diagnosis not present

## 2024-07-09 DIAGNOSIS — O0991 Supervision of high risk pregnancy, unspecified, first trimester: Secondary | ICD-10-CM | POA: Diagnosis not present

## 2024-08-08 DIAGNOSIS — R300 Dysuria: Secondary | ICD-10-CM | POA: Diagnosis not present

## 2024-08-08 DIAGNOSIS — O26812 Pregnancy related exhaustion and fatigue, second trimester: Secondary | ICD-10-CM | POA: Diagnosis not present

## 2024-08-12 DIAGNOSIS — E538 Deficiency of other specified B group vitamins: Secondary | ICD-10-CM | POA: Diagnosis not present

## 2024-08-29 DIAGNOSIS — Z3A19 19 weeks gestation of pregnancy: Secondary | ICD-10-CM | POA: Diagnosis not present

## 2024-08-29 DIAGNOSIS — Z3689 Encounter for other specified antenatal screening: Secondary | ICD-10-CM | POA: Diagnosis not present

## 2024-08-29 DIAGNOSIS — O09522 Supervision of elderly multigravida, second trimester: Secondary | ICD-10-CM | POA: Diagnosis not present

## 2024-09-09 DIAGNOSIS — E538 Deficiency of other specified B group vitamins: Secondary | ICD-10-CM | POA: Diagnosis not present

## 2024-09-16 DIAGNOSIS — I10 Essential (primary) hypertension: Secondary | ICD-10-CM | POA: Diagnosis not present

## 2024-10-07 DIAGNOSIS — Z23 Encounter for immunization: Secondary | ICD-10-CM | POA: Diagnosis not present

## 2024-10-07 DIAGNOSIS — E538 Deficiency of other specified B group vitamins: Secondary | ICD-10-CM | POA: Diagnosis not present

## 2024-10-28 DIAGNOSIS — I10 Essential (primary) hypertension: Secondary | ICD-10-CM | POA: Diagnosis not present

## 2024-10-28 DIAGNOSIS — Z23 Encounter for immunization: Secondary | ICD-10-CM | POA: Diagnosis not present

## 2024-10-28 DIAGNOSIS — Z3A28 28 weeks gestation of pregnancy: Secondary | ICD-10-CM | POA: Diagnosis not present

## 2024-10-28 DIAGNOSIS — O09522 Supervision of elderly multigravida, second trimester: Secondary | ICD-10-CM | POA: Diagnosis not present

## 2024-10-28 DIAGNOSIS — O0992 Supervision of high risk pregnancy, unspecified, second trimester: Secondary | ICD-10-CM | POA: Diagnosis not present

## 2024-12-30 ENCOUNTER — Inpatient Hospital Stay: Admission: RE | Admit: 2024-12-30

## 2025-01-01 ENCOUNTER — Encounter: Admission: RE | Payer: Self-pay

## 2025-01-01 ENCOUNTER — Inpatient Hospital Stay: Admission: RE | Admit: 2025-01-01 | Admitting: Obstetrics and Gynecology
